# Patient Record
Sex: Female | Born: 1935 | Race: White | Hispanic: No | Marital: Married | Smoking: Former smoker
Health system: Southern US, Community
[De-identification: ages and names within clinical notes are randomized; demographics above are authoritative.]

## PROBLEM LIST (undated history)

## (undated) DIAGNOSIS — F32A Depression, unspecified: Secondary | ICD-10-CM

## (undated) DIAGNOSIS — C50919 Malignant neoplasm of unspecified site of unspecified female breast: Secondary | ICD-10-CM

## (undated) DIAGNOSIS — R32 Unspecified urinary incontinence: Secondary | ICD-10-CM

## (undated) DIAGNOSIS — G35 Multiple sclerosis: Secondary | ICD-10-CM

## (undated) DIAGNOSIS — J189 Pneumonia, unspecified organism: Secondary | ICD-10-CM

## (undated) DIAGNOSIS — M1990 Unspecified osteoarthritis, unspecified site: Secondary | ICD-10-CM

## (undated) DIAGNOSIS — K219 Gastro-esophageal reflux disease without esophagitis: Secondary | ICD-10-CM

## (undated) DIAGNOSIS — J439 Emphysema, unspecified: Secondary | ICD-10-CM

## (undated) DIAGNOSIS — K760 Fatty (change of) liver, not elsewhere classified: Secondary | ICD-10-CM

## (undated) DIAGNOSIS — E039 Hypothyroidism, unspecified: Secondary | ICD-10-CM

## (undated) DIAGNOSIS — F329 Major depressive disorder, single episode, unspecified: Secondary | ICD-10-CM

## (undated) DIAGNOSIS — L719 Rosacea, unspecified: Secondary | ICD-10-CM

## (undated) DIAGNOSIS — J383 Other diseases of vocal cords: Secondary | ICD-10-CM

## (undated) DIAGNOSIS — J449 Chronic obstructive pulmonary disease, unspecified: Secondary | ICD-10-CM

## (undated) DIAGNOSIS — R7611 Nonspecific reaction to tuberculin skin test without active tuberculosis: Secondary | ICD-10-CM

## (undated) DIAGNOSIS — E7800 Pure hypercholesterolemia, unspecified: Secondary | ICD-10-CM

## (undated) DIAGNOSIS — R259 Unspecified abnormal involuntary movements: Secondary | ICD-10-CM

## (undated) DIAGNOSIS — I10 Essential (primary) hypertension: Secondary | ICD-10-CM

## (undated) DIAGNOSIS — I2510 Atherosclerotic heart disease of native coronary artery without angina pectoris: Secondary | ICD-10-CM

## (undated) DIAGNOSIS — G459 Transient cerebral ischemic attack, unspecified: Secondary | ICD-10-CM

## (undated) DIAGNOSIS — G35D Multiple sclerosis, unspecified: Secondary | ICD-10-CM

## (undated) DIAGNOSIS — G709 Myoneural disorder, unspecified: Secondary | ICD-10-CM

## (undated) DIAGNOSIS — E119 Type 2 diabetes mellitus without complications: Secondary | ICD-10-CM

## (undated) HISTORY — DX: Pneumonia, unspecified organism: J18.9

## (undated) HISTORY — DX: Type 2 diabetes mellitus without complications: E11.9

## (undated) HISTORY — DX: Rosacea, unspecified: L71.9

## (undated) HISTORY — DX: Hypothyroidism, unspecified: E03.9

## (undated) HISTORY — DX: Pure hypercholesterolemia, unspecified: E78.00

## (undated) HISTORY — DX: Chronic obstructive pulmonary disease, unspecified: J44.9

## (undated) HISTORY — DX: Multiple sclerosis, unspecified: G35.D

## (undated) HISTORY — DX: Malignant neoplasm of unspecified site of unspecified female breast: C50.919

## (undated) HISTORY — DX: Other diseases of vocal cords: J38.3

## (undated) HISTORY — DX: Myoneural disorder, unspecified: G70.9

## (undated) HISTORY — DX: Gastro-esophageal reflux disease without esophagitis: K21.9

## (undated) HISTORY — DX: Nonspecific reaction to tuberculin skin test without active tuberculosis: R76.11

## (undated) HISTORY — DX: Depression, unspecified: F32.A

## (undated) HISTORY — DX: Major depressive disorder, single episode, unspecified: F32.9

## (undated) HISTORY — DX: Unspecified osteoarthritis, unspecified site: M19.90

## (undated) HISTORY — DX: Essential (primary) hypertension: I10

## (undated) HISTORY — DX: Unspecified abnormal involuntary movements: R25.9

## (undated) HISTORY — DX: Multiple sclerosis: G35

## (undated) HISTORY — DX: Atherosclerotic heart disease of native coronary artery without angina pectoris: I25.10

## (undated) HISTORY — DX: Emphysema, unspecified: J43.9

## (undated) HISTORY — DX: Fatty (change of) liver, not elsewhere classified: K76.0

## (undated) HISTORY — DX: Unspecified urinary incontinence: R32

## (undated) HISTORY — DX: Transient cerebral ischemic attack, unspecified: G45.9

---
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
## 1962-02-06 HISTORY — PX: DILATION AND CURETTAGE OF UTERUS: SHX78

## 1997-02-06 HISTORY — PX: NASAL SINUS SURGERY: SHX719

## 1997-06-08 ENCOUNTER — Other Ambulatory Visit: Admission: RE | Admit: 1997-06-08 | Discharge: 1997-06-08 | Payer: Self-pay | Admitting: Obstetrics and Gynecology

## 1997-07-27 ENCOUNTER — Inpatient Hospital Stay (HOSPITAL_COMMUNITY): Admission: EM | Admit: 1997-07-27 | Discharge: 1997-08-03 | Payer: Self-pay | Admitting: Critical Care Medicine

## 1997-11-09 ENCOUNTER — Encounter: Payer: Self-pay | Admitting: *Deleted

## 1997-11-11 ENCOUNTER — Ambulatory Visit (HOSPITAL_COMMUNITY): Admission: RE | Admit: 1997-11-11 | Discharge: 1997-11-12 | Payer: Self-pay | Admitting: *Deleted

## 1998-06-22 ENCOUNTER — Other Ambulatory Visit: Admission: RE | Admit: 1998-06-22 | Discharge: 1998-06-22 | Payer: Self-pay | Admitting: Obstetrics and Gynecology

## 1998-11-28 ENCOUNTER — Encounter: Payer: Self-pay | Admitting: Emergency Medicine

## 1998-11-28 ENCOUNTER — Emergency Department (HOSPITAL_COMMUNITY): Admission: EM | Admit: 1998-11-28 | Discharge: 1998-11-28 | Payer: Self-pay

## 1999-01-07 ENCOUNTER — Encounter: Payer: Self-pay | Admitting: Family Medicine

## 1999-06-21 ENCOUNTER — Inpatient Hospital Stay (HOSPITAL_COMMUNITY): Admission: EM | Admit: 1999-06-21 | Discharge: 1999-06-28 | Payer: Self-pay | Admitting: Critical Care Medicine

## 1999-06-21 ENCOUNTER — Encounter: Payer: Self-pay | Admitting: Critical Care Medicine

## 1999-07-25 ENCOUNTER — Other Ambulatory Visit: Admission: RE | Admit: 1999-07-25 | Discharge: 1999-07-25 | Payer: Self-pay | Admitting: Obstetrics and Gynecology

## 1999-08-11 ENCOUNTER — Ambulatory Visit (HOSPITAL_COMMUNITY): Admission: RE | Admit: 1999-08-11 | Discharge: 1999-08-11 | Payer: Self-pay | Admitting: Interventional Cardiology

## 1999-08-11 ENCOUNTER — Encounter: Payer: Self-pay | Admitting: Interventional Cardiology

## 1999-10-08 ENCOUNTER — Encounter: Payer: Self-pay | Admitting: Family Medicine

## 1999-10-08 LAB — CONVERTED CEMR LAB: Hgb A1c MFr Bld: 6.4 %

## 1999-11-07 ENCOUNTER — Encounter: Payer: Self-pay | Admitting: Family Medicine

## 1999-11-07 LAB — CONVERTED CEMR LAB
Hgb A1c MFr Bld: 6.7 %
Microalbumin U total vol: 1.86 mg/L

## 1999-12-06 ENCOUNTER — Encounter: Payer: Self-pay | Admitting: Neurology

## 1999-12-06 ENCOUNTER — Ambulatory Visit (HOSPITAL_COMMUNITY): Admission: RE | Admit: 1999-12-06 | Discharge: 1999-12-06 | Payer: Self-pay | Admitting: Neurology

## 2000-05-15 DIAGNOSIS — K449 Diaphragmatic hernia without obstruction or gangrene: Secondary | ICD-10-CM | POA: Insufficient documentation

## 2000-05-23 ENCOUNTER — Ambulatory Visit (HOSPITAL_COMMUNITY): Admission: RE | Admit: 2000-05-23 | Discharge: 2000-05-23 | Payer: Self-pay | Admitting: Gastroenterology

## 2000-05-23 ENCOUNTER — Encounter: Payer: Self-pay | Admitting: Gastroenterology

## 2000-06-04 ENCOUNTER — Ambulatory Visit (HOSPITAL_COMMUNITY): Admission: RE | Admit: 2000-06-04 | Discharge: 2000-06-04 | Payer: Self-pay | Admitting: Urology

## 2000-06-04 ENCOUNTER — Encounter: Payer: Self-pay | Admitting: Urology

## 2000-09-06 ENCOUNTER — Encounter: Payer: Self-pay | Admitting: Family Medicine

## 2001-02-06 ENCOUNTER — Encounter: Payer: Self-pay | Admitting: Family Medicine

## 2001-02-06 LAB — CONVERTED CEMR LAB: Hgb A1c MFr Bld: 6.8 %

## 2001-06-13 ENCOUNTER — Encounter: Payer: Self-pay | Admitting: Critical Care Medicine

## 2001-06-13 ENCOUNTER — Inpatient Hospital Stay (HOSPITAL_COMMUNITY): Admission: EM | Admit: 2001-06-13 | Discharge: 2001-06-19 | Payer: Self-pay | Admitting: Critical Care Medicine

## 2002-01-20 ENCOUNTER — Ambulatory Visit (HOSPITAL_COMMUNITY): Admission: RE | Admit: 2002-01-20 | Discharge: 2002-01-20 | Payer: Self-pay | Admitting: Gastroenterology

## 2002-01-20 ENCOUNTER — Encounter: Payer: Self-pay | Admitting: Gastroenterology

## 2002-01-23 ENCOUNTER — Ambulatory Visit (HOSPITAL_COMMUNITY): Admission: RE | Admit: 2002-01-23 | Discharge: 2002-01-23 | Payer: Self-pay | Admitting: Gastroenterology

## 2002-01-23 ENCOUNTER — Encounter: Payer: Self-pay | Admitting: Internal Medicine

## 2002-01-23 DIAGNOSIS — K2940 Chronic atrophic gastritis without bleeding: Secondary | ICD-10-CM | POA: Insufficient documentation

## 2002-02-07 ENCOUNTER — Encounter: Payer: Self-pay | Admitting: Family Medicine

## 2002-03-14 ENCOUNTER — Encounter: Payer: Self-pay | Admitting: Gastroenterology

## 2002-03-14 ENCOUNTER — Ambulatory Visit (HOSPITAL_COMMUNITY): Admission: RE | Admit: 2002-03-14 | Discharge: 2002-03-14 | Payer: Self-pay | Admitting: Gastroenterology

## 2002-05-12 ENCOUNTER — Encounter: Payer: Self-pay | Admitting: Critical Care Medicine

## 2002-05-12 ENCOUNTER — Inpatient Hospital Stay (HOSPITAL_COMMUNITY): Admission: AD | Admit: 2002-05-12 | Discharge: 2002-05-16 | Payer: Self-pay | Admitting: Critical Care Medicine

## 2002-12-03 ENCOUNTER — Ambulatory Visit (HOSPITAL_COMMUNITY): Admission: RE | Admit: 2002-12-03 | Discharge: 2002-12-03 | Payer: Self-pay | Admitting: Interventional Cardiology

## 2002-12-10 ENCOUNTER — Ambulatory Visit (HOSPITAL_COMMUNITY): Admission: RE | Admit: 2002-12-10 | Discharge: 2002-12-10 | Payer: Self-pay | Admitting: Interventional Cardiology

## 2002-12-19 ENCOUNTER — Other Ambulatory Visit: Admission: RE | Admit: 2002-12-19 | Discharge: 2002-12-19 | Payer: Self-pay | Admitting: Obstetrics and Gynecology

## 2003-03-10 ENCOUNTER — Encounter: Payer: Self-pay | Admitting: Family Medicine

## 2003-07-08 ENCOUNTER — Encounter: Payer: Self-pay | Admitting: Family Medicine

## 2003-07-08 LAB — CONVERTED CEMR LAB: Microalbumin U total vol: 9.5 mg/L

## 2003-08-20 ENCOUNTER — Ambulatory Visit (HOSPITAL_COMMUNITY): Admission: RE | Admit: 2003-08-20 | Discharge: 2003-08-20 | Payer: Self-pay | Admitting: Family Medicine

## 2003-10-08 ENCOUNTER — Encounter: Payer: Self-pay | Admitting: Family Medicine

## 2003-10-08 LAB — CONVERTED CEMR LAB: Microalbumin U total vol: 11.6 mg/L

## 2003-10-22 ENCOUNTER — Ambulatory Visit (HOSPITAL_COMMUNITY): Admission: RE | Admit: 2003-10-22 | Discharge: 2003-10-22 | Payer: Self-pay | Admitting: Gastroenterology

## 2003-10-22 ENCOUNTER — Encounter: Payer: Self-pay | Admitting: Internal Medicine

## 2003-10-22 DIAGNOSIS — D126 Benign neoplasm of colon, unspecified: Secondary | ICD-10-CM

## 2004-01-07 ENCOUNTER — Encounter: Payer: Self-pay | Admitting: Family Medicine

## 2004-01-08 ENCOUNTER — Ambulatory Visit: Payer: Self-pay | Admitting: Family Medicine

## 2004-01-11 ENCOUNTER — Ambulatory Visit: Payer: Self-pay | Admitting: Family Medicine

## 2004-01-12 ENCOUNTER — Ambulatory Visit: Payer: Self-pay | Admitting: Critical Care Medicine

## 2004-01-14 ENCOUNTER — Ambulatory Visit: Payer: Self-pay | Admitting: Critical Care Medicine

## 2004-01-28 ENCOUNTER — Ambulatory Visit: Payer: Self-pay | Admitting: Critical Care Medicine

## 2004-02-02 ENCOUNTER — Encounter: Admission: RE | Admit: 2004-02-02 | Discharge: 2004-02-02 | Payer: Self-pay | Admitting: Obstetrics and Gynecology

## 2004-02-07 HISTORY — PX: BREAST LUMPECTOMY: SHX2

## 2004-02-16 ENCOUNTER — Encounter: Admission: RE | Admit: 2004-02-16 | Discharge: 2004-02-16 | Payer: Self-pay | Admitting: General Surgery

## 2004-02-17 ENCOUNTER — Encounter (INDEPENDENT_AMBULATORY_CARE_PROVIDER_SITE_OTHER): Payer: Self-pay | Admitting: General Surgery

## 2004-02-17 ENCOUNTER — Ambulatory Visit (HOSPITAL_COMMUNITY): Admission: RE | Admit: 2004-02-17 | Discharge: 2004-02-17 | Payer: Self-pay | Admitting: General Surgery

## 2004-02-17 ENCOUNTER — Encounter: Admission: RE | Admit: 2004-02-17 | Discharge: 2004-02-17 | Payer: Self-pay | Admitting: General Surgery

## 2004-02-17 ENCOUNTER — Encounter (INDEPENDENT_AMBULATORY_CARE_PROVIDER_SITE_OTHER): Payer: Self-pay | Admitting: *Deleted

## 2004-02-17 ENCOUNTER — Ambulatory Visit (HOSPITAL_BASED_OUTPATIENT_CLINIC_OR_DEPARTMENT_OTHER): Admission: RE | Admit: 2004-02-17 | Discharge: 2004-02-17 | Payer: Self-pay | Admitting: General Surgery

## 2004-03-02 ENCOUNTER — Ambulatory Visit: Payer: Self-pay | Admitting: Critical Care Medicine

## 2004-03-07 ENCOUNTER — Ambulatory Visit (HOSPITAL_COMMUNITY): Admission: RE | Admit: 2004-03-07 | Discharge: 2004-03-08 | Payer: Self-pay | Admitting: General Surgery

## 2004-03-07 ENCOUNTER — Ambulatory Visit: Payer: Self-pay | Admitting: Internal Medicine

## 2004-03-07 ENCOUNTER — Encounter (INDEPENDENT_AMBULATORY_CARE_PROVIDER_SITE_OTHER): Payer: Self-pay | Admitting: *Deleted

## 2004-03-08 ENCOUNTER — Ambulatory Visit: Payer: Self-pay | Admitting: Oncology

## 2004-03-16 ENCOUNTER — Ambulatory Visit: Admission: RE | Admit: 2004-03-16 | Discharge: 2004-05-25 | Payer: Self-pay | Admitting: Radiation Oncology

## 2004-03-16 ENCOUNTER — Ambulatory Visit: Payer: Self-pay | Admitting: Critical Care Medicine

## 2004-03-21 ENCOUNTER — Encounter: Admission: RE | Admit: 2004-03-21 | Discharge: 2004-03-21 | Payer: Self-pay | Admitting: Oncology

## 2004-03-22 ENCOUNTER — Ambulatory Visit (HOSPITAL_COMMUNITY): Admission: RE | Admit: 2004-03-22 | Discharge: 2004-03-22 | Payer: Self-pay | Admitting: Oncology

## 2004-03-23 ENCOUNTER — Ambulatory Visit (HOSPITAL_COMMUNITY): Admission: RE | Admit: 2004-03-23 | Discharge: 2004-03-23 | Payer: Self-pay | Admitting: Oncology

## 2004-04-07 ENCOUNTER — Ambulatory Visit: Payer: Self-pay | Admitting: Family Medicine

## 2004-04-12 ENCOUNTER — Ambulatory Visit: Payer: Self-pay | Admitting: Family Medicine

## 2004-04-26 ENCOUNTER — Ambulatory Visit: Payer: Self-pay | Admitting: Critical Care Medicine

## 2004-05-16 ENCOUNTER — Ambulatory Visit: Payer: Self-pay | Admitting: Critical Care Medicine

## 2004-05-17 ENCOUNTER — Ambulatory Visit: Payer: Self-pay | Admitting: Oncology

## 2004-06-16 ENCOUNTER — Ambulatory Visit: Admission: RE | Admit: 2004-06-16 | Discharge: 2004-06-16 | Payer: Self-pay | Admitting: Radiation Oncology

## 2004-07-12 ENCOUNTER — Ambulatory Visit: Payer: Self-pay | Admitting: Oncology

## 2004-07-14 ENCOUNTER — Ambulatory Visit: Payer: Self-pay | Admitting: Critical Care Medicine

## 2004-08-10 ENCOUNTER — Ambulatory Visit: Payer: Self-pay | Admitting: Critical Care Medicine

## 2004-11-01 ENCOUNTER — Ambulatory Visit: Payer: Self-pay | Admitting: Family Medicine

## 2004-11-01 LAB — CONVERTED CEMR LAB: Hgb A1c MFr Bld: 5.8 %

## 2004-11-05 ENCOUNTER — Ambulatory Visit: Payer: Self-pay | Admitting: Family Medicine

## 2004-11-07 ENCOUNTER — Ambulatory Visit: Payer: Self-pay | Admitting: Family Medicine

## 2004-11-09 ENCOUNTER — Ambulatory Visit: Payer: Self-pay | Admitting: Family Medicine

## 2004-11-21 ENCOUNTER — Ambulatory Visit: Payer: Self-pay | Admitting: Family Medicine

## 2004-12-01 ENCOUNTER — Ambulatory Visit: Payer: Self-pay | Admitting: Critical Care Medicine

## 2004-12-27 ENCOUNTER — Ambulatory Visit: Payer: Self-pay | Admitting: Critical Care Medicine

## 2005-01-24 ENCOUNTER — Other Ambulatory Visit: Admission: RE | Admit: 2005-01-24 | Discharge: 2005-01-24 | Payer: Self-pay | Admitting: Obstetrics and Gynecology

## 2005-02-10 ENCOUNTER — Ambulatory Visit: Payer: Self-pay | Admitting: Critical Care Medicine

## 2005-02-13 ENCOUNTER — Encounter: Admission: RE | Admit: 2005-02-13 | Discharge: 2005-02-13 | Payer: Self-pay | Admitting: Obstetrics and Gynecology

## 2005-03-01 ENCOUNTER — Ambulatory Visit: Payer: Self-pay | Admitting: Critical Care Medicine

## 2005-04-24 ENCOUNTER — Ambulatory Visit: Payer: Self-pay | Admitting: Critical Care Medicine

## 2005-05-18 ENCOUNTER — Encounter (HOSPITAL_COMMUNITY): Admission: RE | Admit: 2005-05-18 | Discharge: 2005-08-16 | Payer: Self-pay | Admitting: Interventional Cardiology

## 2005-06-07 ENCOUNTER — Ambulatory Visit: Payer: Self-pay | Admitting: Critical Care Medicine

## 2005-07-07 ENCOUNTER — Encounter: Admission: RE | Admit: 2005-07-07 | Discharge: 2005-07-07 | Payer: Self-pay | Admitting: Oncology

## 2005-07-12 ENCOUNTER — Ambulatory Visit: Payer: Self-pay | Admitting: Oncology

## 2005-07-12 LAB — CBC WITH DIFFERENTIAL/PLATELET
BASO%: 0.5 % (ref 0.0–2.0)
EOS%: 7.4 % — ABNORMAL HIGH (ref 0.0–7.0)
HCT: 39.6 % (ref 34.8–46.6)
LYMPH%: 33.6 % (ref 14.0–48.0)
MCH: 33.2 pg (ref 26.0–34.0)
MCHC: 34.4 g/dL (ref 32.0–36.0)
MCV: 96.7 fL (ref 81.0–101.0)
MONO#: 0.6 10*3/uL (ref 0.1–0.9)
MONO%: 10.7 % (ref 0.0–13.0)
NEUT%: 47.8 % (ref 39.6–76.8)
Platelets: 217 10*3/uL (ref 145–400)

## 2005-07-27 ENCOUNTER — Ambulatory Visit: Payer: Self-pay | Admitting: Family Medicine

## 2005-08-18 ENCOUNTER — Ambulatory Visit: Payer: Self-pay | Admitting: Critical Care Medicine

## 2005-12-04 ENCOUNTER — Ambulatory Visit: Payer: Self-pay | Admitting: Critical Care Medicine

## 2006-02-08 ENCOUNTER — Ambulatory Visit: Payer: Self-pay | Admitting: Gastroenterology

## 2006-02-16 ENCOUNTER — Encounter: Admission: RE | Admit: 2006-02-16 | Discharge: 2006-02-16 | Payer: Self-pay | Admitting: General Surgery

## 2006-02-26 ENCOUNTER — Ambulatory Visit: Payer: Self-pay | Admitting: Critical Care Medicine

## 2006-03-05 ENCOUNTER — Ambulatory Visit: Payer: Self-pay | Admitting: Family Medicine

## 2006-03-05 LAB — CONVERTED CEMR LAB: Creatinine, Ser: 1.1 mg/dL (ref 0.4–1.2)

## 2006-03-07 ENCOUNTER — Encounter: Admission: RE | Admit: 2006-03-07 | Discharge: 2006-03-07 | Payer: Self-pay | Admitting: General Surgery

## 2006-03-21 ENCOUNTER — Ambulatory Visit: Payer: Self-pay | Admitting: Critical Care Medicine

## 2006-05-02 ENCOUNTER — Ambulatory Visit: Payer: Self-pay | Admitting: Critical Care Medicine

## 2006-07-09 ENCOUNTER — Ambulatory Visit: Payer: Self-pay | Admitting: Oncology

## 2006-07-11 LAB — CBC WITH DIFFERENTIAL/PLATELET
Basophils Absolute: 0 10*3/uL (ref 0.0–0.1)
Eosinophils Absolute: 0.4 10*3/uL (ref 0.0–0.5)
HCT: 35.6 % (ref 34.8–46.6)
HGB: 12.5 g/dL (ref 11.6–15.9)
LYMPH%: 29.4 % (ref 14.0–48.0)
MONO#: 0.6 10*3/uL (ref 0.1–0.9)
NEUT#: 3 10*3/uL (ref 1.5–6.5)
Platelets: 218 10*3/uL (ref 145–400)
RBC: 3.62 10*6/uL — ABNORMAL LOW (ref 3.70–5.32)
WBC: 5.6 10*3/uL (ref 3.9–10.0)

## 2006-07-26 ENCOUNTER — Ambulatory Visit: Payer: Self-pay | Admitting: Critical Care Medicine

## 2006-09-05 ENCOUNTER — Ambulatory Visit: Payer: Self-pay | Admitting: Pulmonary Disease

## 2006-09-05 ENCOUNTER — Inpatient Hospital Stay (HOSPITAL_COMMUNITY): Admission: EM | Admit: 2006-09-05 | Discharge: 2006-09-14 | Payer: Self-pay | Admitting: Emergency Medicine

## 2006-09-11 ENCOUNTER — Encounter: Payer: Self-pay | Admitting: Critical Care Medicine

## 2006-09-27 ENCOUNTER — Ambulatory Visit: Payer: Self-pay | Admitting: Critical Care Medicine

## 2006-10-29 ENCOUNTER — Ambulatory Visit: Payer: Self-pay | Admitting: Critical Care Medicine

## 2006-10-30 DIAGNOSIS — R32 Unspecified urinary incontinence: Secondary | ICD-10-CM

## 2006-11-15 ENCOUNTER — Ambulatory Visit: Payer: Self-pay | Admitting: Family Medicine

## 2006-11-15 DIAGNOSIS — J209 Acute bronchitis, unspecified: Secondary | ICD-10-CM | POA: Insufficient documentation

## 2006-11-19 ENCOUNTER — Telehealth: Payer: Self-pay | Admitting: Family Medicine

## 2006-11-29 ENCOUNTER — Encounter: Payer: Self-pay | Admitting: Family Medicine

## 2006-12-04 ENCOUNTER — Encounter: Payer: Self-pay | Admitting: Family Medicine

## 2006-12-04 DIAGNOSIS — E119 Type 2 diabetes mellitus without complications: Secondary | ICD-10-CM | POA: Insufficient documentation

## 2006-12-04 DIAGNOSIS — J383 Other diseases of vocal cords: Secondary | ICD-10-CM

## 2006-12-04 DIAGNOSIS — L719 Rosacea, unspecified: Secondary | ICD-10-CM | POA: Insufficient documentation

## 2006-12-04 DIAGNOSIS — J4489 Other specified chronic obstructive pulmonary disease: Secondary | ICD-10-CM | POA: Insufficient documentation

## 2006-12-04 DIAGNOSIS — R259 Unspecified abnormal involuntary movements: Secondary | ICD-10-CM | POA: Insufficient documentation

## 2006-12-04 DIAGNOSIS — F329 Major depressive disorder, single episode, unspecified: Secondary | ICD-10-CM

## 2006-12-04 DIAGNOSIS — K219 Gastro-esophageal reflux disease without esophagitis: Secondary | ICD-10-CM

## 2006-12-04 DIAGNOSIS — I10 Essential (primary) hypertension: Secondary | ICD-10-CM | POA: Insufficient documentation

## 2006-12-04 DIAGNOSIS — J45909 Unspecified asthma, uncomplicated: Secondary | ICD-10-CM | POA: Insufficient documentation

## 2006-12-04 DIAGNOSIS — G35 Multiple sclerosis: Secondary | ICD-10-CM

## 2006-12-04 DIAGNOSIS — E039 Hypothyroidism, unspecified: Secondary | ICD-10-CM | POA: Insufficient documentation

## 2006-12-04 DIAGNOSIS — J449 Chronic obstructive pulmonary disease, unspecified: Secondary | ICD-10-CM

## 2006-12-04 DIAGNOSIS — F3289 Other specified depressive episodes: Secondary | ICD-10-CM | POA: Insufficient documentation

## 2006-12-04 DIAGNOSIS — C50919 Malignant neoplasm of unspecified site of unspecified female breast: Secondary | ICD-10-CM | POA: Insufficient documentation

## 2006-12-14 ENCOUNTER — Ambulatory Visit: Payer: Self-pay | Admitting: Family Medicine

## 2006-12-16 LAB — CONVERTED CEMR LAB: Hgb A1c MFr Bld: 6.3 % — ABNORMAL HIGH (ref 4.6–6.0)

## 2006-12-18 ENCOUNTER — Ambulatory Visit: Payer: Self-pay | Admitting: Family Medicine

## 2006-12-18 LAB — CONVERTED CEMR LAB: T3, Free: 2.6 pg/mL (ref 2.3–4.2)

## 2006-12-21 ENCOUNTER — Telehealth (INDEPENDENT_AMBULATORY_CARE_PROVIDER_SITE_OTHER): Payer: Self-pay | Admitting: *Deleted

## 2006-12-28 ENCOUNTER — Encounter: Payer: Self-pay | Admitting: Critical Care Medicine

## 2006-12-28 ENCOUNTER — Ambulatory Visit: Payer: Self-pay | Admitting: Critical Care Medicine

## 2007-01-16 ENCOUNTER — Telehealth: Payer: Self-pay | Admitting: Family Medicine

## 2007-01-25 ENCOUNTER — Ambulatory Visit: Payer: Self-pay | Admitting: Critical Care Medicine

## 2007-01-28 ENCOUNTER — Telehealth (INDEPENDENT_AMBULATORY_CARE_PROVIDER_SITE_OTHER): Payer: Self-pay | Admitting: *Deleted

## 2007-01-29 ENCOUNTER — Telehealth: Payer: Self-pay | Admitting: Pulmonary Disease

## 2007-01-29 ENCOUNTER — Encounter: Payer: Self-pay | Admitting: Pulmonary Disease

## 2007-01-29 ENCOUNTER — Ambulatory Visit: Payer: Self-pay | Admitting: Pulmonary Disease

## 2007-02-05 ENCOUNTER — Telehealth (INDEPENDENT_AMBULATORY_CARE_PROVIDER_SITE_OTHER): Payer: Self-pay | Admitting: *Deleted

## 2007-02-14 ENCOUNTER — Telehealth: Payer: Self-pay | Admitting: Critical Care Medicine

## 2007-02-18 ENCOUNTER — Encounter: Admission: RE | Admit: 2007-02-18 | Discharge: 2007-02-18 | Payer: Self-pay | Admitting: Oncology

## 2007-02-18 ENCOUNTER — Encounter: Payer: Self-pay | Admitting: Family Medicine

## 2007-02-20 ENCOUNTER — Encounter (INDEPENDENT_AMBULATORY_CARE_PROVIDER_SITE_OTHER): Payer: Self-pay | Admitting: *Deleted

## 2007-02-28 ENCOUNTER — Ambulatory Visit: Payer: Self-pay | Admitting: Pulmonary Disease

## 2007-03-15 ENCOUNTER — Ambulatory Visit: Payer: Self-pay | Admitting: Critical Care Medicine

## 2007-03-20 ENCOUNTER — Ambulatory Visit: Payer: Self-pay | Admitting: Family Medicine

## 2007-03-20 DIAGNOSIS — M542 Cervicalgia: Secondary | ICD-10-CM

## 2007-03-22 ENCOUNTER — Encounter: Admission: RE | Admit: 2007-03-22 | Discharge: 2007-03-22 | Payer: Self-pay | Admitting: Neurology

## 2007-04-11 ENCOUNTER — Telehealth (INDEPENDENT_AMBULATORY_CARE_PROVIDER_SITE_OTHER): Payer: Self-pay | Admitting: *Deleted

## 2007-05-22 ENCOUNTER — Ambulatory Visit: Payer: Self-pay | Admitting: Critical Care Medicine

## 2007-05-27 ENCOUNTER — Ambulatory Visit: Payer: Self-pay | Admitting: Family Medicine

## 2007-05-27 LAB — CONVERTED CEMR LAB
ALT: 38 units/L — ABNORMAL HIGH (ref 0–35)
AST: 37 units/L (ref 0–37)
Albumin: 3.7 g/dL (ref 3.5–5.2)
Basophils Relative: 0.1 % (ref 0.0–1.0)
Calcium: 9.3 mg/dL (ref 8.4–10.5)
Chloride: 104 meq/L (ref 96–112)
Eosinophils Relative: 6.8 % — ABNORMAL HIGH (ref 0.0–5.0)
HDL: 47.4 mg/dL (ref >39.0)
Hgb A1c MFr Bld: 7 % — ABNORMAL HIGH (ref 4.6–6.0)
LDL Cholesterol: 69 mg/dL (ref 0–99)
Lymphocytes Relative: 29.8 % (ref 12.0–46.0)
Monocytes Absolute: 0.6 10*3/uL (ref 0.1–1.0)
Neutro Abs: 3.6 10*3/uL (ref 1.4–7.7)
Potassium: 4.4 meq/L (ref 3.5–5.1)
RDW: 13.6 % (ref 11.5–14.6)
TSH: 1.1 microintl units/mL (ref 0.35–5.50)
Total CHOL/HDL Ratio: 3.3
Total Protein: 6.7 g/dL (ref 6.0–8.3)
Triglycerides: 195 mg/dL — ABNORMAL HIGH (ref 0–149)
VLDL: 39 mg/dL (ref 0–40)
WBC: 6.5 10*3/uL (ref 4.5–10.5)

## 2007-05-28 ENCOUNTER — Ambulatory Visit: Payer: Self-pay | Admitting: Family Medicine

## 2007-05-28 LAB — CONVERTED CEMR LAB: Microalb, Ur: 1.1 mg/dL (ref 0.0–1.9)

## 2007-05-31 ENCOUNTER — Ambulatory Visit: Payer: Self-pay | Admitting: Family Medicine

## 2007-07-05 ENCOUNTER — Ambulatory Visit: Payer: Self-pay | Admitting: Internal Medicine

## 2007-07-05 DIAGNOSIS — R1319 Other dysphagia: Secondary | ICD-10-CM | POA: Insufficient documentation

## 2007-07-05 DIAGNOSIS — K5900 Constipation, unspecified: Secondary | ICD-10-CM | POA: Insufficient documentation

## 2007-07-08 ENCOUNTER — Ambulatory Visit: Payer: Self-pay | Admitting: Oncology

## 2007-07-10 ENCOUNTER — Encounter: Payer: Self-pay | Admitting: Critical Care Medicine

## 2007-07-10 ENCOUNTER — Encounter: Payer: Self-pay | Admitting: Family Medicine

## 2007-07-10 LAB — CBC WITH DIFFERENTIAL/PLATELET
EOS%: 6.4 % (ref 0.0–7.0)
HGB: 13.2 g/dL (ref 11.6–15.9)
MCH: 33.3 pg (ref 26.0–34.0)
MCV: 96.9 fL (ref 81.0–101.0)
MONO%: 8.4 % (ref 0.0–13.0)
NEUT#: 3.8 10*3/uL (ref 1.5–6.5)
RBC: 3.96 10*6/uL (ref 3.70–5.32)
RDW: 13.6 % (ref 11.3–14.5)
lymph#: 1.5 10*3/uL (ref 0.9–3.3)

## 2007-07-11 ENCOUNTER — Telehealth: Payer: Self-pay | Admitting: Family Medicine

## 2007-07-11 LAB — COMPREHENSIVE METABOLIC PANEL
ALT: 29 U/L (ref 0–35)
AST: 25 U/L (ref 0–37)
Albumin: 4 g/dL (ref 3.5–5.2)
Alkaline Phosphatase: 117 U/L (ref 39–117)
Calcium: 9.1 mg/dL (ref 8.4–10.5)
Chloride: 102 mEq/L (ref 96–112)
Potassium: 4.8 mEq/L (ref 3.5–5.3)
Sodium: 136 mEq/L (ref 135–145)
Total Protein: 6.6 g/dL (ref 6.0–8.3)

## 2007-07-22 ENCOUNTER — Ambulatory Visit (HOSPITAL_COMMUNITY): Admission: RE | Admit: 2007-07-22 | Discharge: 2007-07-22 | Payer: Self-pay | Admitting: Internal Medicine

## 2007-07-22 ENCOUNTER — Encounter: Payer: Self-pay | Admitting: Internal Medicine

## 2007-07-24 ENCOUNTER — Ambulatory Visit: Payer: Self-pay | Admitting: Internal Medicine

## 2007-08-01 ENCOUNTER — Encounter: Payer: Self-pay | Admitting: Family Medicine

## 2007-08-14 ENCOUNTER — Encounter: Payer: Self-pay | Admitting: Family Medicine

## 2007-08-14 LAB — CANCER ANTIGEN 27.29: CA 27.29: 37 U/mL (ref 0–39)

## 2007-12-16 ENCOUNTER — Ambulatory Visit: Payer: Self-pay | Admitting: Internal Medicine

## 2008-01-07 ENCOUNTER — Encounter: Payer: Self-pay | Admitting: Critical Care Medicine

## 2008-01-20 ENCOUNTER — Ambulatory Visit: Payer: Self-pay | Admitting: Critical Care Medicine

## 2008-02-17 ENCOUNTER — Encounter: Payer: Self-pay | Admitting: Family Medicine

## 2008-02-20 ENCOUNTER — Telehealth: Payer: Self-pay | Admitting: Family Medicine

## 2008-03-03 ENCOUNTER — Encounter: Admission: RE | Admit: 2008-03-03 | Discharge: 2008-03-03 | Payer: Self-pay | Admitting: Family Medicine

## 2008-03-04 ENCOUNTER — Encounter (INDEPENDENT_AMBULATORY_CARE_PROVIDER_SITE_OTHER): Payer: Self-pay | Admitting: *Deleted

## 2008-03-18 ENCOUNTER — Encounter: Payer: Self-pay | Admitting: Family Medicine

## 2008-06-02 ENCOUNTER — Telehealth: Payer: Self-pay | Admitting: Family Medicine

## 2008-06-06 HISTORY — PX: FRACTURE SURGERY: SHX138

## 2008-07-02 ENCOUNTER — Telehealth: Payer: Self-pay | Admitting: Family Medicine

## 2008-07-07 ENCOUNTER — Ambulatory Visit: Payer: Self-pay | Admitting: Oncology

## 2008-07-30 ENCOUNTER — Ambulatory Visit: Payer: Self-pay | Admitting: Diagnostic Radiology

## 2008-07-30 ENCOUNTER — Ambulatory Visit (HOSPITAL_BASED_OUTPATIENT_CLINIC_OR_DEPARTMENT_OTHER): Admission: RE | Admit: 2008-07-30 | Discharge: 2008-07-30 | Payer: Self-pay | Admitting: Critical Care Medicine

## 2008-07-30 ENCOUNTER — Ambulatory Visit: Payer: Self-pay | Admitting: Family Medicine

## 2008-07-30 ENCOUNTER — Ambulatory Visit: Payer: Self-pay | Admitting: Critical Care Medicine

## 2008-07-30 DIAGNOSIS — R5383 Other fatigue: Secondary | ICD-10-CM

## 2008-07-30 DIAGNOSIS — R5381 Other malaise: Secondary | ICD-10-CM

## 2008-07-31 ENCOUNTER — Ambulatory Visit: Payer: Self-pay | Admitting: Adult Health

## 2008-07-31 LAB — CONVERTED CEMR LAB
BUN: 22 mg/dL (ref 6–23)
Basophils Absolute: 0.1 10*3/uL (ref 0.0–0.1)
Bilirubin, Direct: 0.2 mg/dL (ref 0.0–0.3)
Chloride: 101 meq/L (ref 96–112)
Creatinine, Ser: 1.2 mg/dL (ref 0.4–1.2)
Eosinophils Absolute: 0.4 10*3/uL (ref 0.0–0.7)
Eosinophils Relative: 4.3 % (ref 0.0–5.0)
Ferritin: 289.8 ng/mL (ref 10.0–291.0)
Free T4: 1.3 ng/dL (ref 0.6–1.6)
Glucose, Bld: 130 mg/dL — ABNORMAL HIGH (ref 70–99)
MCHC: 34.5 g/dL (ref 30.0–36.0)
MCV: 99.6 fL (ref 78.0–100.0)
Magnesium: 2.5 mg/dL (ref 1.5–2.5)
Monocytes Absolute: 0.8 10*3/uL (ref 0.1–1.0)
Neutrophils Relative %: 74.8 % (ref 43.0–77.0)
Platelets: 209 10*3/uL (ref 150.0–400.0)
RDW: 12.4 % (ref 11.5–14.6)
T3, Free: 2.2 pg/mL — ABNORMAL LOW (ref 2.3–4.2)
TSH: 0.59 microintl units/mL (ref 0.35–5.50)
Total Bilirubin: 0.8 mg/dL (ref 0.3–1.2)
Vitamin B-12: 822 pg/mL (ref 211–911)
WBC: 9.3 10*3/uL (ref 4.5–10.5)

## 2008-08-04 ENCOUNTER — Encounter (INDEPENDENT_AMBULATORY_CARE_PROVIDER_SITE_OTHER): Payer: Self-pay | Admitting: *Deleted

## 2008-08-06 ENCOUNTER — Ambulatory Visit (HOSPITAL_BASED_OUTPATIENT_CLINIC_OR_DEPARTMENT_OTHER): Admission: RE | Admit: 2008-08-06 | Discharge: 2008-08-06 | Payer: Self-pay | Admitting: Critical Care Medicine

## 2008-08-06 ENCOUNTER — Ambulatory Visit: Payer: Self-pay | Admitting: Diagnostic Radiology

## 2008-08-06 ENCOUNTER — Ambulatory Visit: Payer: Self-pay | Admitting: Critical Care Medicine

## 2008-08-07 ENCOUNTER — Encounter: Payer: Self-pay | Admitting: Critical Care Medicine

## 2008-08-11 ENCOUNTER — Telehealth: Payer: Self-pay | Admitting: Critical Care Medicine

## 2008-08-12 ENCOUNTER — Encounter: Payer: Self-pay | Admitting: Critical Care Medicine

## 2008-08-13 ENCOUNTER — Encounter: Payer: Self-pay | Admitting: Critical Care Medicine

## 2008-08-13 ENCOUNTER — Ambulatory Visit: Payer: Self-pay | Admitting: Cardiology

## 2008-08-25 ENCOUNTER — Ambulatory Visit: Payer: Self-pay | Admitting: Oncology

## 2008-08-25 ENCOUNTER — Ambulatory Visit: Payer: Self-pay | Admitting: Critical Care Medicine

## 2008-08-27 ENCOUNTER — Encounter: Payer: Self-pay | Admitting: Family Medicine

## 2008-08-27 LAB — CBC WITH DIFFERENTIAL/PLATELET
Basophils Absolute: 0 10*3/uL (ref 0.0–0.1)
HCT: 38.4 % (ref 34.8–46.6)
HGB: 13.1 g/dL (ref 11.6–15.9)
LYMPH%: 28.5 % (ref 14.0–49.7)
MCHC: 34.2 g/dL (ref 31.5–36.0)
MONO#: 0.7 10*3/uL (ref 0.1–0.9)
NEUT%: 51.3 % (ref 38.4–76.8)
Platelets: 175 10*3/uL (ref 145–400)
WBC: 6.7 10*3/uL (ref 3.9–10.3)
lymph#: 1.9 10*3/uL (ref 0.9–3.3)

## 2008-08-27 LAB — COMPREHENSIVE METABOLIC PANEL
Albumin: 3.8 g/dL (ref 3.5–5.2)
BUN: 24 mg/dL — ABNORMAL HIGH (ref 6–23)
CO2: 26 mEq/L (ref 19–32)
Calcium: 9.2 mg/dL (ref 8.4–10.5)
Chloride: 101 mEq/L (ref 96–112)
Creatinine, Ser: 1.09 mg/dL (ref 0.40–1.20)
Glucose, Bld: 150 mg/dL — ABNORMAL HIGH (ref 70–99)
Potassium: 5 mEq/L (ref 3.5–5.3)

## 2008-09-08 ENCOUNTER — Ambulatory Visit: Payer: Self-pay | Admitting: Critical Care Medicine

## 2008-09-28 IMAGING — CR DG CHEST 2V
2 series · 2 of 2 positions shown · non-contrast
Comparison: 02/16/04.

CLINICAL DATA: Shortness of breath.  COPD.  
 AP AND LATERAL CHEST - 2 VIEW:

[w chest lat]
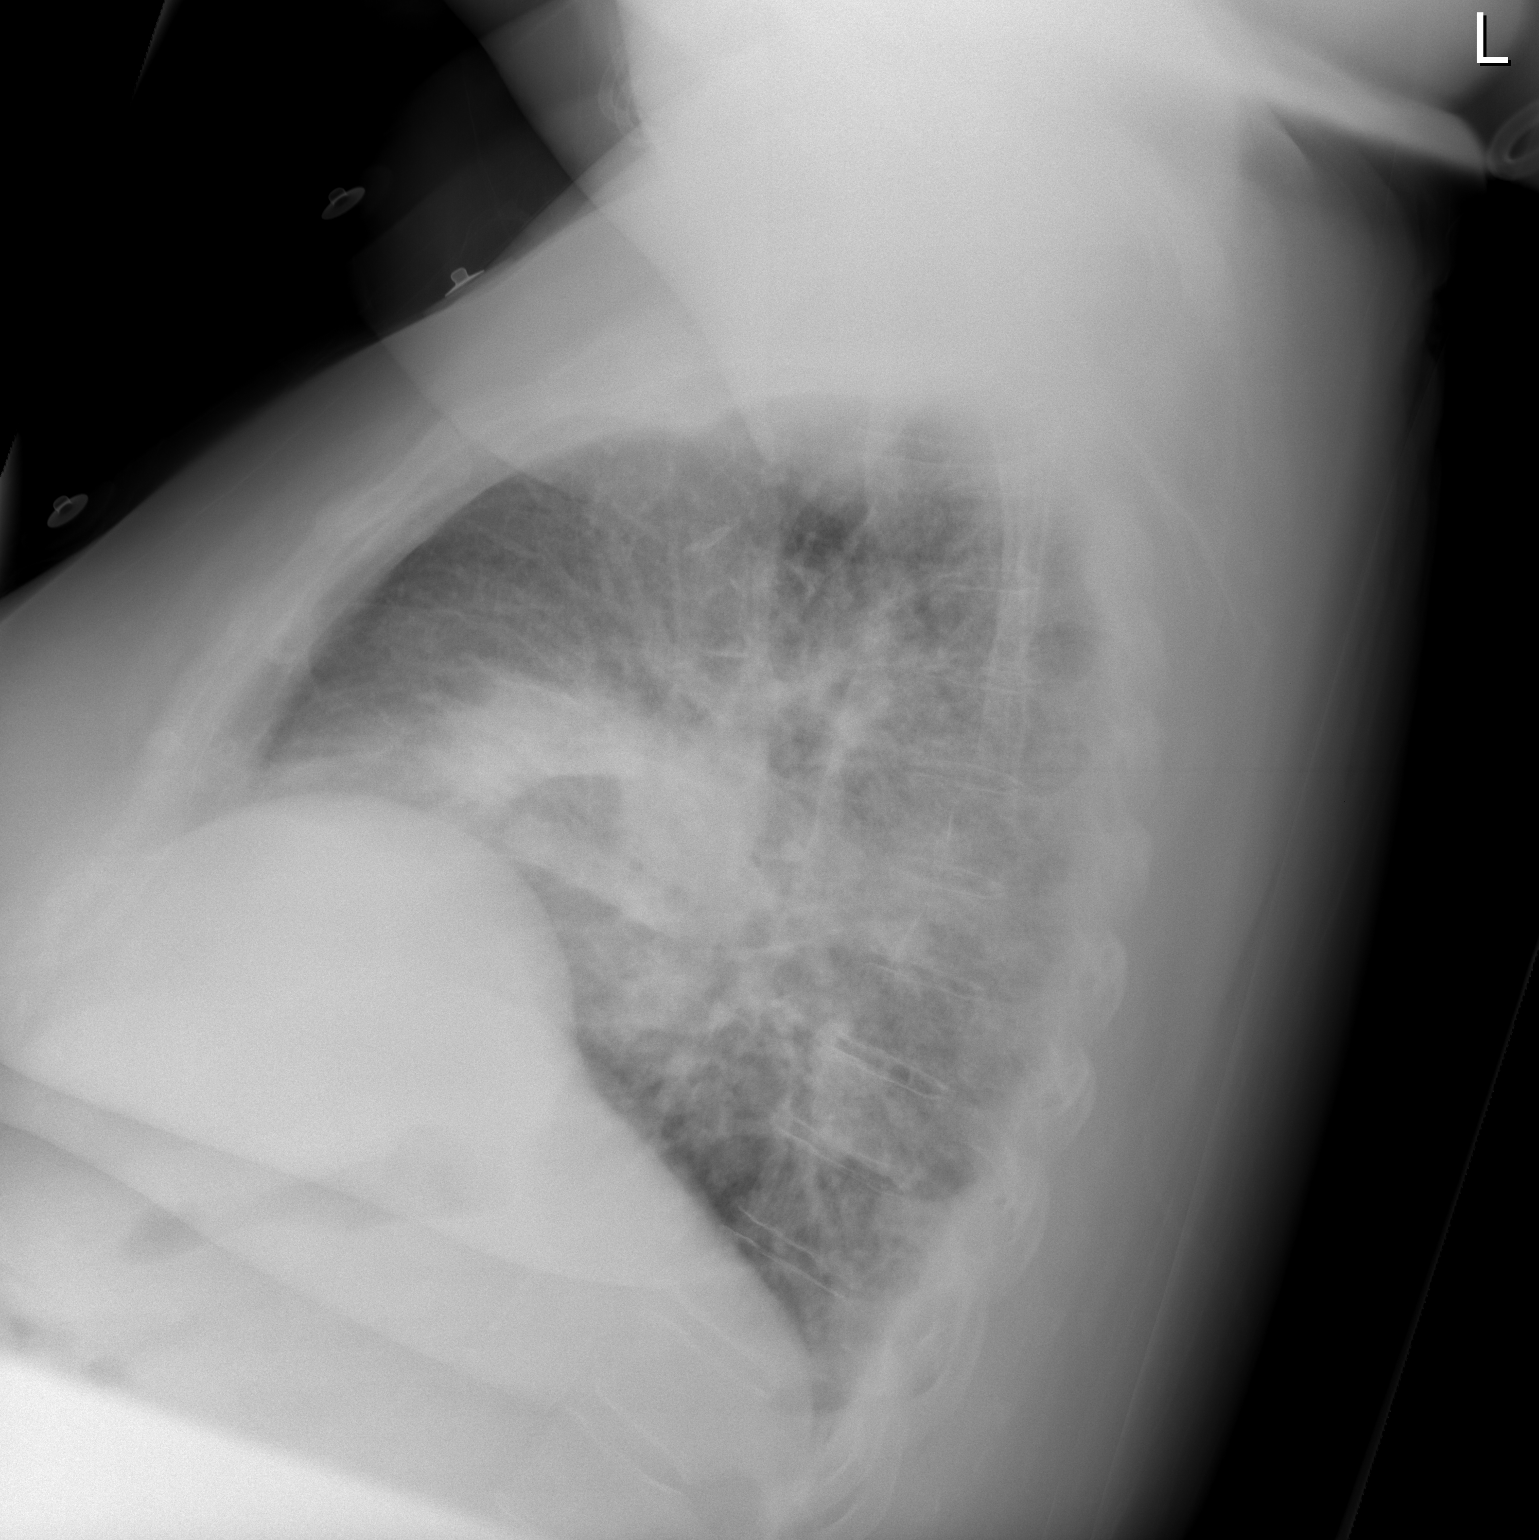

[view not recorded]
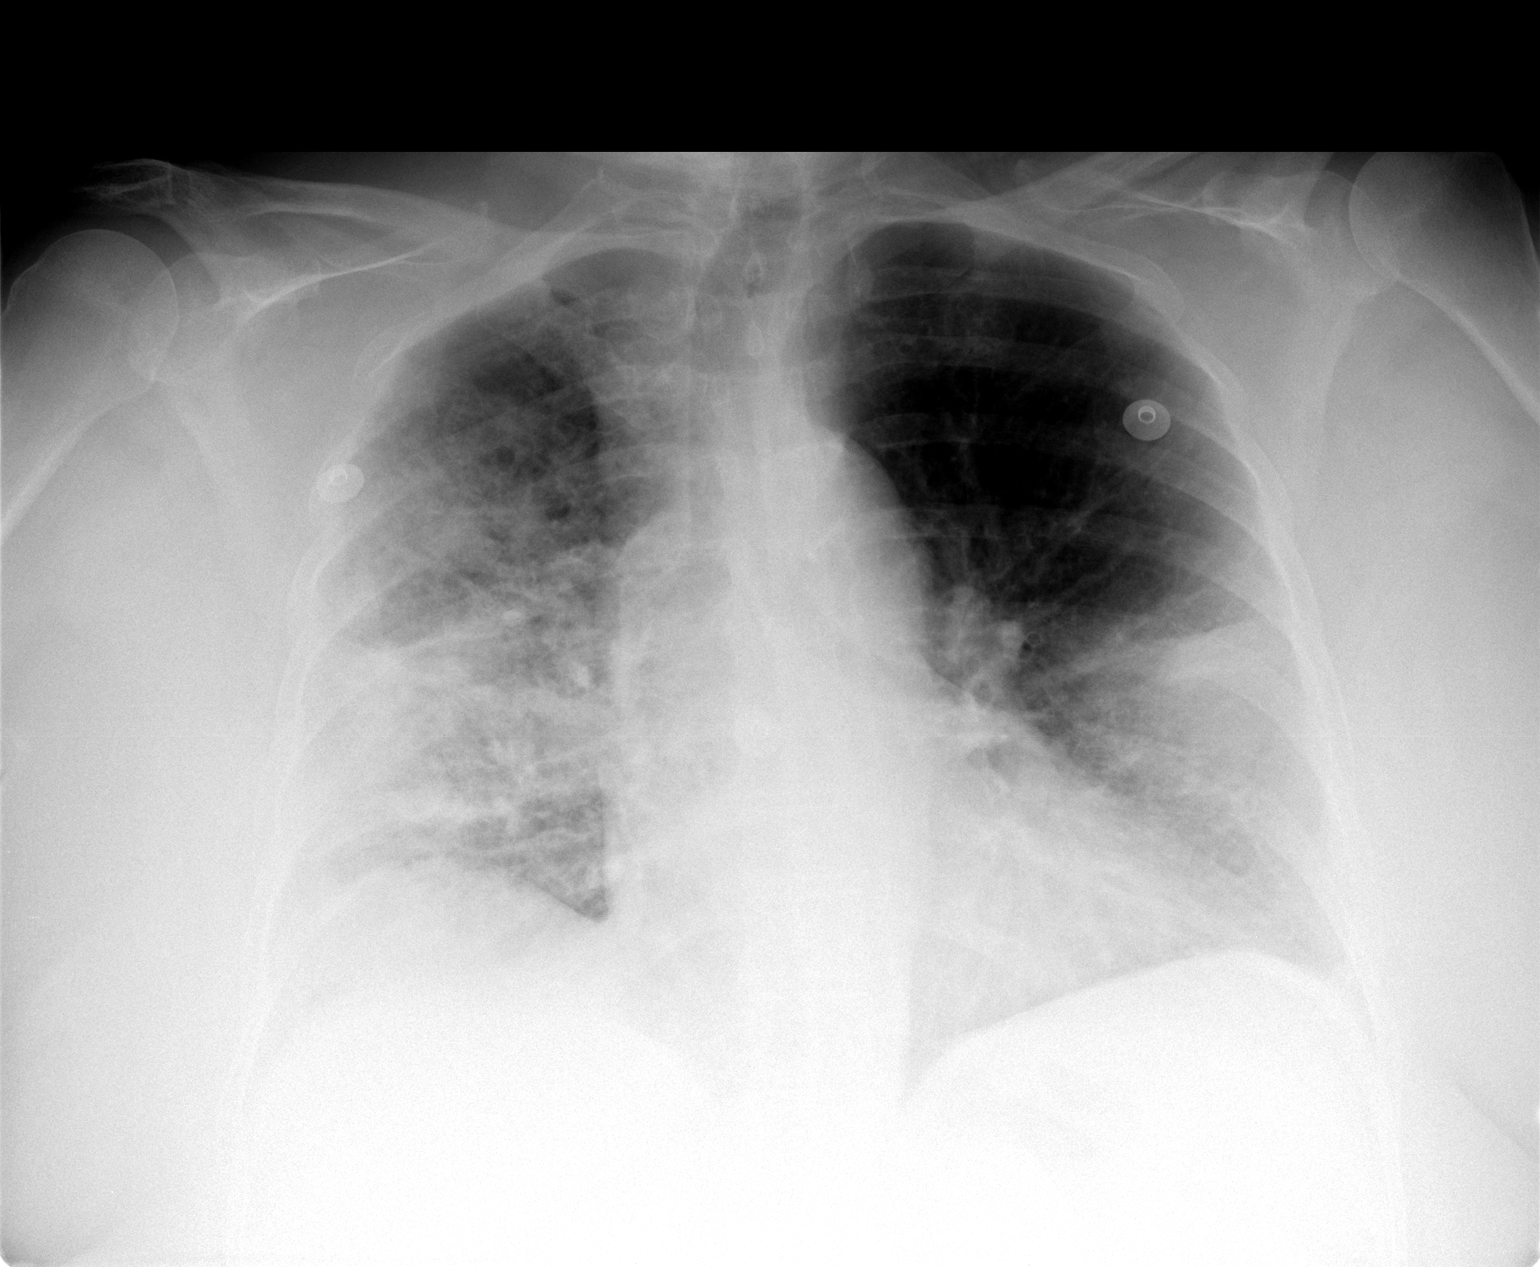

[2 of 2 positions shown; findings below may reference images not displayed]

FINDINGS: The patient has extensive pneumonia in the right lung as well as a focal area of infiltrate in the lateral aspect of the left mid zone.  Heart size and vascularity are within normal limits.
IMPRESSION: Bilateral pneumonia. If the patient is not febrile, the possibility of metastatic disease in this patient with a history of breast malignancy should be considered.

## 2008-10-05 ENCOUNTER — Ambulatory Visit: Payer: Self-pay | Admitting: Internal Medicine

## 2008-10-06 ENCOUNTER — Telehealth (INDEPENDENT_AMBULATORY_CARE_PROVIDER_SITE_OTHER): Payer: Self-pay | Admitting: *Deleted

## 2008-10-15 ENCOUNTER — Ambulatory Visit: Payer: Self-pay | Admitting: Critical Care Medicine

## 2008-10-19 ENCOUNTER — Ambulatory Visit: Payer: Self-pay | Admitting: Vascular Surgery

## 2008-10-19 ENCOUNTER — Encounter: Payer: Self-pay | Admitting: Critical Care Medicine

## 2008-10-19 ENCOUNTER — Encounter: Payer: Self-pay | Admitting: Family Medicine

## 2008-11-06 ENCOUNTER — Telehealth (INDEPENDENT_AMBULATORY_CARE_PROVIDER_SITE_OTHER): Payer: Self-pay | Admitting: Internal Medicine

## 2008-11-30 ENCOUNTER — Encounter: Payer: Self-pay | Admitting: Family Medicine

## 2008-12-01 ENCOUNTER — Encounter: Payer: Self-pay | Admitting: Family Medicine

## 2008-12-07 ENCOUNTER — Telehealth: Payer: Self-pay | Admitting: Family Medicine

## 2008-12-15 ENCOUNTER — Telehealth (INDEPENDENT_AMBULATORY_CARE_PROVIDER_SITE_OTHER): Payer: Self-pay | Admitting: Internal Medicine

## 2008-12-17 ENCOUNTER — Telehealth: Payer: Self-pay | Admitting: Family Medicine

## 2008-12-17 ENCOUNTER — Ambulatory Visit: Payer: Self-pay | Admitting: Critical Care Medicine

## 2008-12-18 ENCOUNTER — Telehealth: Payer: Self-pay | Admitting: Critical Care Medicine

## 2009-01-07 ENCOUNTER — Ambulatory Visit: Payer: Self-pay | Admitting: Podiatry

## 2009-02-01 ENCOUNTER — Telehealth: Payer: Self-pay | Admitting: Family Medicine

## 2009-02-03 ENCOUNTER — Ambulatory Visit: Payer: Self-pay | Admitting: Critical Care Medicine

## 2009-02-15 ENCOUNTER — Ambulatory Visit: Payer: Self-pay | Admitting: Critical Care Medicine

## 2009-02-15 ENCOUNTER — Ambulatory Visit: Payer: Self-pay | Admitting: Internal Medicine

## 2009-02-22 ENCOUNTER — Ambulatory Visit: Payer: Self-pay | Admitting: Internal Medicine

## 2009-02-24 ENCOUNTER — Ambulatory Visit: Payer: Self-pay | Admitting: Family Medicine

## 2009-02-24 LAB — CONVERTED CEMR LAB
AST: 23 units/L (ref 0–37)
Alkaline Phosphatase: 92 units/L (ref 39–117)
Basophils Absolute: 0.1 10*3/uL (ref 0.0–0.1)
Basophils Relative: 0.8 % (ref 0.0–3.0)
CO2: 29 meq/L (ref 19–32)
Calcium: 9.2 mg/dL (ref 8.4–10.5)
Chloride: 103 meq/L (ref 96–112)
Creatinine, Ser: 1.2 mg/dL (ref 0.4–1.2)
Eosinophils Absolute: 0.2 10*3/uL (ref 0.0–0.7)
Glucose, Bld: 75 mg/dL (ref 70–99)
Hemoglobin: 14.5 g/dL (ref 12.0–15.0)
LDL Cholesterol: 41 mg/dL (ref 0–99)
Lymphocytes Relative: 31 % (ref 12.0–46.0)
MCHC: 32.8 g/dL (ref 30.0–36.0)
MCV: 102.7 fL — ABNORMAL HIGH (ref 78.0–100.0)
Monocytes Absolute: 0.8 10*3/uL (ref 0.1–1.0)
Neutro Abs: 6.1 10*3/uL (ref 1.4–7.7)
RBC: 4.31 M/uL (ref 3.87–5.11)
RDW: 12.6 % (ref 11.5–14.6)
Total Bilirubin: 0.9 mg/dL (ref 0.3–1.2)
Total CHOL/HDL Ratio: 2

## 2009-02-25 ENCOUNTER — Telehealth (INDEPENDENT_AMBULATORY_CARE_PROVIDER_SITE_OTHER): Payer: Self-pay | Admitting: *Deleted

## 2009-02-25 LAB — CONVERTED CEMR LAB: Vitamin B-12: 646 pg/mL (ref 211–911)

## 2009-03-02 ENCOUNTER — Ambulatory Visit: Payer: Self-pay | Admitting: Family Medicine

## 2009-03-04 ENCOUNTER — Ambulatory Visit: Payer: Self-pay | Admitting: Critical Care Medicine

## 2009-03-09 ENCOUNTER — Encounter (INDEPENDENT_AMBULATORY_CARE_PROVIDER_SITE_OTHER): Payer: Self-pay | Admitting: *Deleted

## 2009-03-09 ENCOUNTER — Encounter: Admission: RE | Admit: 2009-03-09 | Discharge: 2009-03-09 | Payer: Self-pay | Admitting: Family Medicine

## 2009-04-15 ENCOUNTER — Ambulatory Visit: Payer: Self-pay | Admitting: Radiology

## 2009-04-15 ENCOUNTER — Ambulatory Visit: Payer: Self-pay | Admitting: Critical Care Medicine

## 2009-04-15 ENCOUNTER — Encounter: Payer: Self-pay | Admitting: Critical Care Medicine

## 2009-04-15 ENCOUNTER — Inpatient Hospital Stay (HOSPITAL_COMMUNITY): Admission: AD | Admit: 2009-04-15 | Discharge: 2009-04-20 | Payer: Self-pay | Admitting: Critical Care Medicine

## 2009-04-19 ENCOUNTER — Encounter: Payer: Self-pay | Admitting: Critical Care Medicine

## 2009-04-19 ENCOUNTER — Ambulatory Visit: Payer: Self-pay | Admitting: Surgery

## 2009-04-27 ENCOUNTER — Encounter: Payer: Self-pay | Admitting: Critical Care Medicine

## 2009-05-06 ENCOUNTER — Ambulatory Visit: Payer: Self-pay | Admitting: Critical Care Medicine

## 2009-05-25 ENCOUNTER — Ambulatory Visit: Payer: Self-pay | Admitting: Oncology

## 2009-05-27 ENCOUNTER — Encounter: Payer: Self-pay | Admitting: Critical Care Medicine

## 2009-05-27 LAB — CBC WITH DIFFERENTIAL/PLATELET
BASO%: 0.6 % (ref 0.0–2.0)
LYMPH%: 27 % (ref 14.0–49.7)
MCHC: 34.6 g/dL (ref 31.5–36.0)
MONO#: 0.7 10*3/uL (ref 0.1–0.9)
Platelets: 270 10*3/uL (ref 145–400)
RBC: 3.89 10*6/uL (ref 3.70–5.45)
WBC: 8.2 10*3/uL (ref 3.9–10.3)
lymph#: 2.2 10*3/uL (ref 0.9–3.3)

## 2009-05-27 LAB — COMPREHENSIVE METABOLIC PANEL
ALT: 33 U/L (ref 0–35)
Alkaline Phosphatase: 86 U/L (ref 39–117)
CO2: 28 mEq/L (ref 19–32)
Sodium: 137 mEq/L (ref 135–145)
Total Bilirubin: 0.6 mg/dL (ref 0.3–1.2)
Total Protein: 6.7 g/dL (ref 6.0–8.3)

## 2009-06-01 ENCOUNTER — Telehealth: Payer: Self-pay | Admitting: Family Medicine

## 2009-06-13 ENCOUNTER — Emergency Department: Payer: Self-pay | Admitting: Emergency Medicine

## 2009-06-16 ENCOUNTER — Ambulatory Visit: Payer: Self-pay | Admitting: Internal Medicine

## 2009-06-21 ENCOUNTER — Ambulatory Visit: Payer: Self-pay | Admitting: Pulmonary Disease

## 2009-06-21 ENCOUNTER — Encounter: Payer: Self-pay | Admitting: Family Medicine

## 2009-06-21 ENCOUNTER — Inpatient Hospital Stay (HOSPITAL_COMMUNITY): Admission: AD | Admit: 2009-06-21 | Discharge: 2009-06-24 | Payer: Self-pay | Admitting: Orthopedic Surgery

## 2009-06-25 ENCOUNTER — Ambulatory Visit: Payer: Self-pay | Admitting: Internal Medicine

## 2009-06-25 ENCOUNTER — Telehealth: Payer: Self-pay | Admitting: Family Medicine

## 2009-06-29 ENCOUNTER — Ambulatory Visit: Payer: Self-pay | Admitting: Internal Medicine

## 2009-07-01 ENCOUNTER — Encounter: Payer: Self-pay | Admitting: Family Medicine

## 2009-07-09 ENCOUNTER — Ambulatory Visit: Payer: Self-pay | Admitting: Family Medicine

## 2009-07-22 ENCOUNTER — Encounter: Payer: Self-pay | Admitting: Family Medicine

## 2009-08-23 ENCOUNTER — Ambulatory Visit: Payer: Self-pay | Admitting: Internal Medicine

## 2009-08-23 ENCOUNTER — Encounter: Payer: Self-pay | Admitting: Family Medicine

## 2009-08-30 ENCOUNTER — Ambulatory Visit: Payer: Self-pay | Admitting: Family Medicine

## 2009-08-30 DIAGNOSIS — R300 Dysuria: Secondary | ICD-10-CM | POA: Insufficient documentation

## 2009-08-30 LAB — CONVERTED CEMR LAB
Ketones, urine, test strip: NEGATIVE
Nitrite: POSITIVE
Protein, U semiquant: NEGATIVE
Urobilinogen, UA: 0.2

## 2009-08-31 ENCOUNTER — Encounter: Payer: Self-pay | Admitting: Family Medicine

## 2009-09-09 ENCOUNTER — Ambulatory Visit: Payer: Self-pay | Admitting: Critical Care Medicine

## 2009-09-13 ENCOUNTER — Encounter: Payer: Self-pay | Admitting: Critical Care Medicine

## 2009-09-22 ENCOUNTER — Encounter: Payer: Self-pay | Admitting: Family Medicine

## 2009-09-23 ENCOUNTER — Encounter: Payer: Self-pay | Admitting: Critical Care Medicine

## 2009-09-26 ENCOUNTER — Telehealth: Payer: Self-pay | Admitting: Critical Care Medicine

## 2009-11-01 ENCOUNTER — Encounter: Payer: Self-pay | Admitting: Critical Care Medicine

## 2009-11-03 ENCOUNTER — Encounter: Payer: Self-pay | Admitting: Family Medicine

## 2009-11-19 ENCOUNTER — Ambulatory Visit: Payer: Self-pay | Admitting: Oncology

## 2009-11-24 ENCOUNTER — Encounter: Payer: Self-pay | Admitting: Family Medicine

## 2009-12-03 ENCOUNTER — Encounter: Payer: Self-pay | Admitting: Family Medicine

## 2009-12-07 ENCOUNTER — Telehealth: Payer: Self-pay | Admitting: Family Medicine

## 2009-12-09 ENCOUNTER — Ambulatory Visit: Payer: Self-pay | Admitting: Critical Care Medicine

## 2009-12-24 ENCOUNTER — Ambulatory Visit: Payer: Self-pay | Admitting: Internal Medicine

## 2009-12-28 ENCOUNTER — Telehealth: Payer: Self-pay | Admitting: Internal Medicine

## 2009-12-29 ENCOUNTER — Encounter (INDEPENDENT_AMBULATORY_CARE_PROVIDER_SITE_OTHER): Payer: Self-pay | Admitting: *Deleted

## 2010-01-05 ENCOUNTER — Encounter: Payer: Self-pay | Admitting: Family Medicine

## 2010-01-19 ENCOUNTER — Ambulatory Visit (HOSPITAL_COMMUNITY)
Admission: RE | Admit: 2010-01-19 | Discharge: 2010-01-19 | Payer: Self-pay | Source: Home / Self Care | Attending: Internal Medicine | Admitting: Internal Medicine

## 2010-01-19 ENCOUNTER — Encounter: Payer: Self-pay | Admitting: Internal Medicine

## 2010-02-24 ENCOUNTER — Ambulatory Visit
Admission: RE | Admit: 2010-02-24 | Discharge: 2010-02-24 | Payer: Self-pay | Source: Home / Self Care | Attending: Critical Care Medicine | Admitting: Critical Care Medicine

## 2010-02-26 ENCOUNTER — Other Ambulatory Visit: Payer: Self-pay | Admitting: Family Medicine

## 2010-02-26 ENCOUNTER — Encounter: Payer: Self-pay | Admitting: Obstetrics and Gynecology

## 2010-02-26 DIAGNOSIS — Z0000 Encounter for general adult medical examination without abnormal findings: Secondary | ICD-10-CM

## 2010-02-27 ENCOUNTER — Encounter: Payer: Self-pay | Admitting: General Surgery

## 2010-03-08 NOTE — Assessment & Plan Note (Signed)
Summary: dysphagia, liquids and solids...as.    History of Present Illness Visit Type: Follow-up Visit Primary GI MD: Stan Head MD [facility] Primary Sahithi Ordoyne: Joycelyn Man Requesting Oaklie Durrett: Marlowe Sax Chief Complaint: dysphagia with solids and liquids History of Present Illness:   77 yo ww with multiple medical problems inccluding multiple sclerosis. Last seen in 2009 (at hospital), for GERD and dysphagia. Esophageal dilation for stenotic upper esophageal sphincter then was effective. Over the past several months or so she has decveloped recurrent dysphagia. She had a fall and upper extremity fractures with rehab stay earlier this year. She is having solid food dysphagia, and drinking behind solid food boluses to help food pass. She also "chokes" but apparently does not clearly aspirate.  Modified Barium Swallow 3/11 apparently negative Feels like she needs another dilation.  Daughter, [name], RN also provides history details.   GI Review of Systems    Reports dysphagia with liquids and  dysphagia with solids.      Denies abdominal pain, acid reflux, belching, bloating, chest pain, heartburn, loss of appetite, nausea, vomiting, vomiting blood, weight loss, and  weight gain.        Denies anal fissure, black tarry stools, change in bowel habit, constipation, diarrhea, diverticulosis, fecal incontinence, heme positive stool, hemorrhoids, irritable bowel syndrome, jaundice, light color stool, liver problems, rectal bleeding, and  rectal pain.    EGD  Procedure date:  07/22/2007  Findings:      1) STENOSIS OF UPPER ESOPHAGEAL SPHINCTER (DILATED TO 54 FR) 2) MILDLY TORTUOUS ESOPHAGUS 3) MULTIPLE BENIGN-APPEARING PROXIMAL GASTRIC POLYPS, SUSPECT THESE ARE FUNDIC GLAND POLYPS, BIOPSY TAKEN 4) NO OTHER ABNORMALITIES  STOMACH, POLYP, BIOPSY:  FUNDIC GLAND POLYP, NO HELICOBACTER PYLORI, INTESTINAL METAPLASIA, DYSPLASIA OR MALIGNANCY IDENTIFIED.   Current Medications  (verified): 1)  Spiriva Handihaler 18 Mcg Caps (Tiotropium Bromide Monohydrate) .... Inhale Contents of 1 Capsule Once A Day 2)  Zetia 10 Mg  Tabs (Ezetimibe) .Marland Kitchen.. 1 Tablet At Bedtime 3)  Aspirin 81 Mg  Tbec (Aspirin) .... Take One By Mouth Once A Day 4)  Protonix 40 Mg  Tbec (Pantoprazole Sodium) .... One By Mouth Two Times A Day 5)  Metformin Hcl 500 Mg  Tb24 (Metformin Hcl) .Marland Kitchen.. 1 At Bedtime 6)  Cozaar 50 Mg  Tabs (Losartan Potassium) .... Take 1 Tablet By Mouth Once A Day 7)  Catapres 0.3 Mg Tabs (Clonidine Hcl) .... One Tab By Mouth Two Times A Day 8)  Effexor Xr 75 Mg  Cp24 (Venlafaxine Hcl) .... Take One By Mouth Once A Day 9)  Calcium Carbonate-Vitamin D 600-400 Mg-Unit  Tabs (Calcium Carbonate-Vitamin D) .... Take 1 Tablet By Mouth Two Times A Day 10)  Magnesium 250 Mg Tabs (Magnesium) .... Take 1 Tablet By Mouth Once A Day 11)  Multivitamins   Tabs (Multiple Vitamin) .... Take 1 Tablet By Mouth Once A Day 12)  Cyclobenzaprine Hcl 10 Mg Tabs (Cyclobenzaprine Hcl) .... Take 1 Tablet By Mouth Two Times A Day 13)  Tylenol Pm Extra Strength 500-25 Mg Tabs (Diphenhydramine-Apap (Sleep)) .... Take 1 Tab By Mouth At Bedtime 14)  Synthroid 137 Mcg  Tabs (Levothyroxine Sodium) .... One By Mouth Once Daily 15)  Miralax   Powd (Polyethylene Glycol 3350) .Marland Kitchen.. 1 Packet Daily As Needed 16)  Lasix 20 Mg Tabs (Furosemide) .Marland Kitchen.. 1 By Mouth Once Daily As Needed Edema 17)  Aleve 220 Mg Tabs (Naproxen Sodium) .... Per Bottle 18)  Vicodin 5-500 Mg Tabs (Hydrocodone-Acetaminophen) .... Take 1 Tablet By Mouth  Every 4-6 Hours As Needed 19)  Tessalon 200 Mg Caps (Benzonatate) .Marland Kitchen.. 1 By Mouth Three Times A Day As Needed 20)  Albuterol Sulfate (2.5 Mg/55ml) 0.083% Nebu (Albuterol Sulfate) .Marland Kitchen.. 1 Vial Every 4-6 Hours As Needed 21)  Tussionex Pennkinetic Er 8-10 Mg/7ml Lqcr (Chlorpheniramine-Hydrocodone) .Marland Kitchen.. 1 Tsp Every 12 Hr As Needed Cough, May Make You Sleepy 22)  Famotidine 20 Mg Tabs (Famotidine) .... Take 1  Tab By Mouth At Bedtime 23)  Probiotic  Caps (Probiotic Product) .... Take 1 Tablet By Mouth Two Times A Day 24)  Toviaz 4 Mg Xr24h-Tab (Fesoterodine Fumarate) .Marland Kitchen.. 1 Tab in Am  Allergies (verified): 1)  ! Avelox  Past History:  Past Medical History: [name] (ICD-695.3) OBESITY FATTY LIVER DISEASE VIA CT (ICD-571.8) GERD and UES STENOSIS (dilated in past) DIABETES MELLITUS, TYPE II (ICD-250.00) POSITIVE PPD (ICD-795.5) INVOLUNTARY MOVEMENTS, ABNORMAL (ICD-781.0) HYPERCHOLESTEROLEMIA / TRIG  (235/374) (ICD-272.0) HYPOTHYROIDISM (ICD-244.9) URINARY INCONTINENCE (ICD-788.30) SCLEROSIS, MULTIPLE (ICD-340) ADENOCARCINOMA, BREAST (ICD-174.9) VOCAL CORD DISORDER (ICD-478.5) HYPERTENSION (ICD-401.9) DEPRESSION (ICD-311) CORONARY ARTERY DISEASE, MINIMAL (ICD-414.00) COPD (ICD-496) ASTHMA (ICD-493.90) Ankle pain per Dr. Dion Saucier COMMUNITY ACQUIRED PNEUMONIA 3/11  Past Surgical History: Reviewed history from 11/11/2009 and no changes required. Appy 1942 Tonsillectomy 1951 D&C 1964/5 NSVD x 5 BTL 1970 Head CT- chronic sinusitis 02/99 Enoscopy normal except hiatal hernia 1980's Barium swallow- wnl 1980's MRI Head- sm vess dz, , not type of M.S. 01/99 Sinus Surgery 11/1997 Bone density wnl 01/12/98 [facility] hospitalization 05/15-22/01 Head MRI ([name]) wnl 06/01 Adenosine Cardiolite wnl 07/01 Radiologic renal cyst aspiration , left 06/04/00 CT Pelvis, left renal cyst, right sm renal cyst, fatty liver 04/02 Colonoscopy, polyps 04/02 EGD, H.H. 04/02 CT Pelvis (via Charlann Boxer) left renal cyst 02/04 Holter wnl 07/14/99 Cath, wnl (Dr. Katrinka Blazing) 12/03/02 Colonoscopy 3mm polyp destroyed 10/22/03 Right breast lumpectomy, ductal cancer 01/06 MRI of bilat breast, wnl 03/07/06 HOSP R Bimalleolar Ankle Fx/ repair     L Hand 3&4 Metacarpal Fxs    5/16-5/19/2011  Family History: Reviewed history from 05/31/2007 and no changes required. Father: Died at age 75 of coronary disease Mother: Died at age  5 of cancer of the pancreas with diabetes and emphysema in a nonsmoker Siblings: 2 brothers, twins, both with high blood pressure and diabetes, Leukemia. 3 sisters, all deceased, one with nonalcoholic cirrhosis, one with cancer of the pancreas, one with lymphoma CV - none HBP + 2 sisters, 2 brothers DM + sisters, 2 brothers Lymphoma + sister Thyroid cancer + daughter Cancer pancr + mother Liver and pancr cancer + sister Cirrhosis + sister  Social History: Reviewed history from 10/05/2008 and no changes required. Marital Status: Married Children: 5 Occupation: Housewife, former Engineer, building services.  Lives with her husband at [facility] Daughter is [name], Charity fundraiser (former Engineer, civil (consulting) for [employer]) former smoker - quit in 1986, x57yrs, 1-1.5ppd  Review of Systems       no acute respiratory sxs weak in lower extremities  Vital Signs:  Patient profile:   77 year old female Height:      64 inches Weight:      239 pounds BMI:     41.17 Pulse rate:   88 / minute Pulse rhythm:   regular BP sitting:   148 / 84  (left arm)  Vitals Entered By: Milford Cage NCMA (December 24, 2009 3:46 PM)  Physical Exam  General:  obese, NAD Eyes:  anicteric Lungs:  coarse but clear breath sounds E=I, fair to good air movement Heart:  Regular rate  and rhythm; no murmurs, rubs,  or bruits. Msk:  unable to getr onto exam table due to lower extremity, hip weakness Neurologic:  Alert and  oriented x3 Psych:  Alert and cooperative. Normal mood and affect.   Impression & Recommendations:  Problem # 1:  DYSPHAGIA (ICD-787.29) Assessment Deteriorated UES stenosis in past and has responded to dilation plan for EGD/dilation at hospital due to comorbidities but will need to search for a date in December to schedule  Problem # 2:  GERD (ICD-530.81) continue PPI  Patient Instructions: 1)  We will call you to schedule your procedure-EGD. 2)  Upper Endoscopy with Dilatation brochure given.  3)  The medication  list was reviewed and reconciled.  All changed / newly prescribed medications were explained.  A complete medication list was provided to the patient / caregiver.

## 2010-03-08 NOTE — Assessment & Plan Note (Signed)
Summary: ? UTI   Vital Signs:  Patient profile:   77 year old female Height:      64 inches Temp:     98.1 degrees F oral Pulse rate:   76 / minute Pulse rhythm:   regular BP sitting:   140 / 82  (left arm) Cuff size:   large  Vitals Entered By: Linde Gillis CMA Duncan Dull) (August 30, 2009 12:16 PM) CC: ? UTI Comments Patient refused to weigh because she is recovering from a broken foot   History of Present Illness: 77 yo new to me here for ? UTI.  Was recently discharged from SNF s/p rehab from right ankle fracture and left metacarpal fracture. Had an indwelling urinary catheter for 2 months during rehab.  Two days ago noticed mild dysuria and increased urinary frequency. No fevers, chill, nausea, vomiting or back pain.  Current Medications (verified): 1)  Spiriva Handihaler 18 Mcg Caps (Tiotropium Bromide Monohydrate) .... Inhale Contents of 1 Capsule Once A Day 2)  Zetia 10 Mg  Tabs (Ezetimibe) .Marland Kitchen.. 1 Tablet At Bedtime 3)  Aspirin 81 Mg  Tbec (Aspirin) .... Take One By Mouth Once A Day 4)  Arimidex 1 Mg  Tabs (Anastrozole) .... Take 1 Tablet By Mouth Once A Day 5)  Protonix 40 Mg  Tbec (Pantoprazole Sodium) .... One By Mouth Two Times A Day 6)  Metformin Hcl 500 Mg  Tb24 (Metformin Hcl) .Marland Kitchen.. 1 At Bedtime 7)  Cozaar 50 Mg  Tabs (Losartan Potassium) .... Take 1 Tablet By Mouth Once A Day 8)  Catapres 0.3 Mg Tabs (Clonidine Hcl) .... One Tab By Mouth Two Times A Day 9)  Effexor Xr 75 Mg  Cp24 (Venlafaxine Hcl) .... Take One By Mouth Once A Day 10)  Calcium Carbonate-Vitamin D 600-400 Mg-Unit  Tabs (Calcium Carbonate-Vitamin D) .... Take 1 Tablet By Mouth Two Times A Day 11)  Magnesium 250 Mg Tabs (Magnesium) .... Take 1 Tablet By Mouth Once A Day 12)  Multivitamins   Tabs (Multiple Vitamin) .... Take 1 Tablet By Mouth Once A Day 13)  Cyclobenzaprine Hcl 10 Mg Tabs (Cyclobenzaprine Hcl) .... Take 1 Tablet By Mouth Two Times A Day 14)  Tylenol Pm Extra Strength 500-25 Mg Tabs  (Diphenhydramine-Apap (Sleep)) .... Take 1 Tab By Mouth At Bedtime 15)  Synthroid 137 Mcg  Tabs (Levothyroxine Sodium) .... One By Mouth Once Daily 16)  Miralax   Powd (Polyethylene Glycol 3350) .Marland Kitchen.. 1 Packet Daily As Needed 17)  Lasix 20 Mg Tabs (Furosemide) .Marland Kitchen.. 1 By Mouth Once Daily As Needed Edema 18)  Aleve 220 Mg Tabs (Naproxen Sodium) .... Per Bottle 19)  Vicodin 5-500 Mg Tabs (Hydrocodone-Acetaminophen) .... Take 1 Tablet By Mouth Every 4-6 Hours As Needed 20)  Tessalon 200 Mg Caps (Benzonatate) .Marland Kitchen.. 1 By Mouth Three Times A Day As Needed 21)  Albuterol Sulfate (2.5 Mg/70ml) 0.083% Nebu (Albuterol Sulfate) .Marland Kitchen.. 1 Vial Every 4-6 Hours As Needed 22)  Tussionex Pennkinetic Er 8-10 Mg/39ml Lqcr (Chlorpheniramine-Hydrocodone) .Marland Kitchen.. 1 Tsp Every 12 Hr As Needed Cough, May Make You Sleepy 23)  Famotidine 20 Mg Tabs (Famotidine) .... Take 1 Tab By Mouth At Bedtime 24)  Probiotic  Caps (Probiotic Product) .... Take 1 Tablet By Mouth Two Times A Day 25)  Ciprofloxacin Hcl 500 Mg Tabs (Ciprofloxacin Hcl) .Marland Kitchen.. 1 Tab By Mouth Two Times A Day X 10 Days  Allergies: 1)  ! Avelox  Past History:  Past Medical History: Last updated: 07/05/2007 [name] (ICD-695.3) FATTY  LIVER DISEASE VIA CT (ICD-571.8) GERD (ICD-530.81) DIABETES MELLITUS, TYPE II (ICD-250.00) POSITIVE PPD (ICD-795.5) INVOLUNTARY MOVEMENTS, ABNORMAL (ICD-781.0) HYPERCHOLESTEROLEMIA / TRIG  (235/374) (ICD-272.0) HYPOTHYROIDISM (ICD-244.9) BRONCHITIS, ACUTE (ICD-466.0) URINARY INCONTINENCE (ICD-788.30) SCLEROSIS, MULTIPLE (ICD-340) ADENOCARCINOMA, BREAST (ICD-174.9) VOCAL CORD DISORDER (ICD-478.5) HYPERTENSION (ICD-401.9) DEPRESSION (ICD-311) CORONARY ARTERY DISEASE, MINIMAL (ICD-414.00) COPD (ICD-496) ASTHMA (ICD-493.90)  Past Surgical History: Last updated: 06/24/2009 Appy 1942 Tonsillectomy 1951 D&C 1964/5 NSVD x 5 BTL 1970 Head CT- chronic sinusitis 02/99 Enoscopy normal except hiatal hernia 1980's Barium swallow-  wnl 1980's MRI Head- sm vess dz, , not type of M.S. 01/99 Sinus Surgery 11/1997 Bone density wnl 01/12/98 [facility] hospitalization 05/15-22/01 Head MRI ([name]) wnl 06/01 Adenosine Cardiolite wnl 07/01 Radiologic renal cyst aspiration , left 06/04/00 CT Pelvis, left renal cyst, right sm renal cyst, fatty liver 04/02 Colonoscopy, polyps 04/02 EGD, H.H. 04/02 CT Pelvis (via Charlann Boxer) left renal cyst 02/04 Holter wnl 07/14/99 Cath, wnl (Dr. Katrinka Blazing) 12/03/02 Colonoscopy 3mm polyp destroyed 10/22/03 Right breast lumpectomy, ductal cancer 01/06 MRI of bilat breast, wnl 03/07/06 HOSP R Bimalleolar Ankle Fx/ repair     L Hand 3&4 Metacarpal Fxs    5/16-5/19/2011  Family History: Last updated: 2007/06/05 Father: Died at age 27 of coronary disease Mother: Died at age 64 of cancer of the pancreas with diabetes and emphysema in a nonsmoker Siblings: 2 brothers, twins, both with high blood pressure and diabetes, Leukemia. 3 sisters, all deceased, one with nonalcoholic cirrhosis, one with cancer of the pancreas, one with lymphoma CV - none HBP + 2 sisters, 2 brothers DM + sisters, 2 brothers Lymphoma + sister Thyroid cancer + daughter Cancer pancr + mother Liver and pancr cancer + sister Cirrhosis + sister  Social History: Last updated: 10/05/2008 Marital Status: Married Children: 5 Occupation: Orthoptist, former Engineer, building services.  Lives with her husband at [facility] Daughter is [name], Charity fundraiser (former Engineer, civil (consulting) for [employer]) former smoker - quit in 1986, x13yrs, 1-1.5ppd  Risk Factors: Alcohol Use: 2 (05-Jun-2007) Caffeine Use: 1 (05-Jun-2007) Exercise: no (05-Jun-2007)  Risk Factors: Smoking Status: quit > 6 months (05/06/2009) Passive Smoke Exposure: no (05-Jun-2007)  Review of Systems      See HPI General:  Denies chills and fever. GI:  Denies nausea and vomiting. GU:  Complains of discharge and urinary frequency; denies hematuria and incontinence.  Physical Exam  General:  Well developed  obese white female , at times comfortable and in no distress but most of the time dyspneic and wheezing audibly with any kind of activity.  Mouth:  MMM Abdomen:  NO CVA or suprapubic tenderness Psych:  Cognition and judgment appear intact. Alert and cooperative with normal attention span and concentration. No apparent delusions, illusions, hallucinations   Impression & Recommendations:  Problem # 1:  ACUTE CYSTITIS (ICD-595.0) Assessment New complicated by recent indwelling catheter, SNF and hospital stay. Will send for cutlure, start on Cipro. (of note, Avelox causes GI upset, has had cipro in past). Her updated medication list for this problem includes:    Ciprofloxacin Hcl 500 Mg Tabs (Ciprofloxacin hcl) .Marland Kitchen... 1 tab by mouth two times a day x 10 days  Orders: T-Culture, Urine ([id number]) Prescription Created Electronically [id number])  Complete Medication List: 1)  Spiriva Handihaler 18 Mcg Caps (Tiotropium bromide monohydrate) .... Inhale contents of 1 capsule once a day 2)  Zetia 10 Mg Tabs (Ezetimibe) .Marland Kitchen.. 1 tablet at bedtime 3)  Aspirin 81 Mg Tbec (Aspirin) .... Take one by mouth once a day 4)  Arimidex 1 Mg  Tabs (Anastrozole) .... Take 1 tablet by mouth once a day 5)  Protonix 40 Mg Tbec (Pantoprazole sodium) .... One by mouth two times a day 6)  Metformin Hcl 500 Mg Tb24 (Metformin hcl) .Marland Kitchen.. 1 at bedtime 7)  Cozaar 50 Mg Tabs (Losartan potassium) .... Take 1 tablet by mouth once a day 8)  Catapres 0.3 Mg Tabs (Clonidine hcl) .... One tab by mouth two times a day 9)  Effexor Xr 75 Mg Cp24 (Venlafaxine hcl) .... Take one by mouth once a day 10)  Calcium Carbonate-vitamin D 600-400 Mg-unit Tabs (Calcium carbonate-vitamin d) .... Take 1 tablet by mouth two times a day 11)  Magnesium 250 Mg Tabs (Magnesium) .... Take 1 tablet by mouth once a day 12)  Multivitamins Tabs (Multiple vitamin) .... Take 1 tablet by mouth once a day 13)  Cyclobenzaprine Hcl 10 Mg Tabs (Cyclobenzaprine hcl)  .... Take 1 tablet by mouth two times a day 14)  Tylenol Pm Extra Strength 500-25 Mg Tabs (Diphenhydramine-apap (sleep)) .... Take 1 tab by mouth at bedtime 15)  Synthroid 137 Mcg Tabs (Levothyroxine sodium) .... One by mouth once daily 16)  Miralax Powd (Polyethylene glycol 3350) .Marland Kitchen.. 1 packet daily as needed 17)  Lasix 20 Mg Tabs (Furosemide) .Marland Kitchen.. 1 by mouth once daily as needed edema 18)  Aleve 220 Mg Tabs (Naproxen sodium) .... Per bottle 19)  Vicodin 5-500 Mg Tabs (Hydrocodone-acetaminophen) .... Take 1 tablet by mouth every 4-6 hours as needed 20)  Tessalon 200 Mg Caps (Benzonatate) .Marland Kitchen.. 1 by mouth three times a day as needed 21)  Albuterol Sulfate (2.5 Mg/75ml) 0.083% Nebu (Albuterol sulfate) .Marland Kitchen.. 1 vial every 4-6 hours as needed 22)  Tussionex Pennkinetic Er 8-10 Mg/46ml Lqcr (Chlorpheniramine-hydrocodone) .Marland Kitchen.. 1 tsp every 12 hr as needed cough, may make you sleepy 23)  Famotidine 20 Mg Tabs (Famotidine) .... Take 1 tab by mouth at bedtime 24)  Probiotic Caps (Probiotic product) .... Take 1 tablet by mouth two times a day 25)  Ciprofloxacin Hcl 500 Mg Tabs (Ciprofloxacin hcl) .Marland Kitchen.. 1 tab by mouth two times a day x 10 days  Other Orders: UA Dipstick w/o Micro (manual) ([id number]) Prescriptions: CIPROFLOXACIN HCL 500 MG TABS (CIPROFLOXACIN HCL) 1 tab by mouth two times a day x 10 days  #20 x 0   Entered and Authorized by:   [name] MD   Signed by:   [name] MD on 08/30/2009   Method used:   Electronically to        [facility] [address] (retail)       [address] [place], [place]  [address]       Ph: [phone]       Fax: [phone]   RxID:   [id number]   Current Allergies (reviewed today): ! [facility] Laboratory Results   Urine Tests  Date/Time Received: August 30, 2009 12:31 PM   Routine Urinalysis   Color: yellow Appearance: Hazy Glucose: negative   (Normal Range: Negative) Bilirubin: negative   (Normal Range:  Negative) Ketone: negative   (Normal Range: Negative) Spec. Gravity: 1.010   (Normal Range: 1.003-1.035) Blood: moderate   (Normal Range: Negative) pH: 5.0   (Normal Range: 5.0-8.0) Protein: negative   (Normal Range: Negative) Urobilinogen: 0.2   (Normal Range: 0-1) Nitrite: positive   (Normal Range: Negative) Leukocyte Esterace: large   (Normal Range: Negative)  Appended Document: ? UTI

## 2010-03-08 NOTE — Letter (Signed)
Summary: EGD Instructions  [facility]  [address] [place], [place] [address]   Phone: [phone]  Fax: [phone]       Ana Nunez    1929-06-08    MRN: [id number]       Procedure Day Dorna Bloom: [name], 01/19/10     Arrival Time: 10:30 AM     Procedure Time: 11:45 AM     Location of Procedure:                    _X_ [facility] ( Outpatient Registration)   PREPARATION FOR ENDOSCOPY   On [facility], 01/19/10, THE DAY OF THE PROCEDURE:  1.   No solid foods, milk or milk products are allowed after midnight the night before your procedure.  2.   Do not drink anything colored red or purple.  Avoid juices with pulp.  No orange juice.  3.  You may drink clear liquids until 7:45 AM, which is 4 hours before your procedure.                                                                                                CLEAR LIQUIDS INCLUDE: Water Jello Ice Popsicles Tea (sugar ok, no milk/cream) Powdered fruit flavored drinks Coffee (sugar ok, no milk/cream) Gatorade Juice: apple, white grape, white cranberry  Lemonade Clear bullion, consomm, broth Carbonated beverages (any kind) Strained chicken noodle soup Hard Candy   MEDICATION INSTRUCTIONS  Unless otherwise instructed, you should take regular prescription medications with a small sip of water as early as possible the morning of your procedure.  Diabetic patients - see separate instructions.               OTHER INSTRUCTIONS  You will need a responsible adult at least 75 years of age to accompany you and drive you home.   This person must remain in the waiting room during your procedure.  Wear loose fitting clothing that is easily removed.  Leave jewelry and other valuables at home.  However, you may wish to bring a book to read or an iPod/MP3 player to listen to music as you wait for your procedure to start.  Remove all body piercing jewelry and leave at home.  Total time  from sign-in until discharge is approximately 2-3 hours.  You should go home directly after your procedure and rest.  You can resume normal activities the day after your procedure.  The day of your procedure you should not:   Drive   Make legal decisions   Operate machinery   Drink alcohol   Return to work  You will receive specific instructions about eating, activities and medications before you leave.    The above instructions have been reviewed and explained to me by   Francee Piccolo, CMA (AAMA)     I fully understand and can verbalize these instructions reviewed via phone w/ pt daughter Date 12/29/09     Appended Document: EGD Instructions printed and mailed to pt's daughter

## 2010-03-08 NOTE — Letter (Signed)
Summary: [facility]  [facility]   Imported By: Sherian Rein 09/17/2009 15:08:44  _____________________________________________________________________  External Attachment:    Type:   Image     Comment:   External Document

## 2010-03-08 NOTE — Letter (Signed)
Summary: Dr.Joshua Landau,Orthopaedic,Note  Dr.Joshua Landau,Orthopaedic,Note   Imported By: Beau Fanny 07/02/2009 16:23:40  _____________________________________________________________________  External Attachment:    Type:   Image     Comment:   External Document

## 2010-03-08 NOTE — Progress Notes (Signed)
----   Converted from flag ---- ---- 02/25/2009 9:31 AM, Mills Koller wrote:   ---- 02/24/2009 5:18 PM, Shaune Leeks MD wrote: Is poss to add B12 lvl to labs? ------------------------------

## 2010-03-08 NOTE — Letter (Signed)
Summary: [name]  [name]   Imported By: Lanelle Bal 08/27/2009 10:54:53  _____________________________________________________________________  External Attachment:    Type:   Image     Comment:   External Document  Appended Document: [name]     Clinical Lists Changes

## 2010-03-08 NOTE — Miscellaneous (Signed)
Summary: CXR   Clinical Lists Changes  Observations: Added new observation of CXR RESULTS: Findings: Focal airspace opacity in the lingular segment of the left upper lobe.  Chronic bronchitic markings.   Normal cardiomediastinal silhouette.  No pleural effusion.  Bony thorax intact.  Focal benign eventration of the anterior aspect of the right hemidiaphragm.   IMPRESSION: Lingular pneumonia.   (04/15/2009 12:29)      CXR  Procedure date:  04/15/2009  Findings:      Findings: Focal airspace opacity in the lingular segment of the left upper lobe.  Chronic bronchitic markings.   Normal cardiomediastinal silhouette.  No pleural effusion.  Bony thorax intact.  Focal benign eventration of the anterior aspect of the right hemidiaphragm.   IMPRESSION: Lingular pneumonia.

## 2010-03-08 NOTE — Assessment & Plan Note (Signed)
Summary: Admission History and Physical   Visit Type:  Sick Visit Primary Provider/Referring Provider:  Joycelyn Man  CC:  Pt c/o wheezing, S.O.B all the time, and productive cough with small amouts of yellow mucus.  History of Present Illness: 77  yo WF with hx of AB/COPD, VCD , and MS  April 15, 2009 11:48 AM Worse since end of last week over two week.  Started ?viral illness.  Skin hurts to touch, chills, low grade fever,  cough is productive yellow and green.  Uses tussionex/tessalon three times a day.   Feels congested and chest felt heavy.  Notes foul odor to urine and dark urine with dysuria. CXR in the office reveals PNA bilateral LL.  Pt admitted for inpatient care .  Asthma History    Initial Asthma Severity Rating:    Age range: 12+ years    Symptoms: throughout the day    Nighttime Awakenings: >1/week but not nightly    Interferes w/ normal activity: extreme limitations    SABA use (not for EIB): daily    Exacerbations requiring oral systemic steroids: 2 or more/year    Asthma Severity Assessment: Severe Persistent   Preventive Screening-Counseling & Management  Alcohol-Tobacco     Smoking Status: quit > 6 months  Current Medications (verified): 1)  Spiriva Handihaler 18 Mcg Caps (Tiotropium Bromide Monohydrate) .... Inhale Contents of 1 Capsule Once A Day 2)  Zetia 10 Mg  Tabs (Ezetimibe) .Marland Kitchen.. 1 Tablet At Bedtime 3)  Aspirin 81 Mg  Tbec (Aspirin) .... Take One By Mouth Once A Day 4)  Arimidex 1 Mg  Tabs (Anastrozole) .... Take 1 Tablet By Mouth Once A Day 5)  Protonix 40 Mg  Tbec (Pantoprazole Sodium) .... One By Mouth Two Times A Day 6)  Metformin Hcl 500 Mg  Tb24 (Metformin Hcl) .Marland Kitchen.. 1 At Bedtime 7)  Cozaar 50 Mg  Tabs (Losartan Potassium) .... Take 1 Tablet By Mouth Once A Day 8)  Catapres 0.3 Mg Tabs (Clonidine Hcl) .... One Tab By Mouth Two Times A Day 9)  Effexor Xr 75 Mg  Cp24 (Venlafaxine Hcl) .... Take One By Mouth Once A Day 10)  Calcium  Carbonate-Vitamin D 600-400 Mg-Unit  Tabs (Calcium Carbonate-Vitamin D) .... Take 1 Tablet By Mouth Two Times A Day 11)  Magnesium 250 Mg Tabs (Magnesium) .... Take 1 Tablet By Mouth Once A Day 12)  Multivitamins   Tabs (Multiple Vitamin) .... Take 1 Tablet By Mouth Once A Day 13)  Cyclobenzaprine Hcl 10 Mg Tabs (Cyclobenzaprine Hcl) .... Take 1 Tablet By Mouth Two Times A Day 14)  Tylenol Pm Extra Strength 500-25 Mg Tabs (Diphenhydramine-Apap (Sleep)) .... Take 1 Tab By Mouth At Bedtime 15)  Synthroid 137 Mcg  Tabs (Levothyroxine Sodium) .... One By Mouth Once Daily 16)  Miralax   Powd (Polyethylene Glycol 3350) .Marland Kitchen.. 1 Packet Daily As Needed 17)  Lasix 20 Mg Tabs (Furosemide) .Marland Kitchen.. 1 By Mouth Once Daily As Needed Edema 18)  Aleve 220 Mg Tabs (Naproxen Sodium) .... Per Bottle 19)  Vicodin 5-500 Mg Tabs (Hydrocodone-Acetaminophen) .... Take 1 Tablet By Mouth Every 4-6 Hours As Needed 20)  Tessalon 200 Mg Caps (Benzonatate) .Marland Kitchen.. 1 By Mouth Three Times A Day 21)  Albuterol Sulfate (2.5 Mg/33ml) 0.083% Nebu (Albuterol Sulfate) .Marland Kitchen.. 1 Vial Every 4-6 Hours As Needed 22)  Tussionex Pennkinetic Er 8-10 Mg/56ml Lqcr (Chlorpheniramine-Hydrocodone) .Marland Kitchen.. 1 Tsp Every 12 Hr As Needed Cough, May Make You Sleepy  Allergies (verified): 1)  !  Avelox  Past History:  Past medical, surgical, family and social histories (including risk factors) reviewed, and no changes noted (except as noted below).  Past Medical History: Reviewed history from 07/05/2007 and no changes required. [name] (ICD-695.3) FATTY LIVER DISEASE VIA CT (ICD-571.8) GERD (ICD-530.81) DIABETES MELLITUS, TYPE II (ICD-250.00) POSITIVE PPD (ICD-795.5) INVOLUNTARY MOVEMENTS, ABNORMAL (ICD-781.0) HYPERCHOLESTEROLEMIA / TRIG  (235/374) (ICD-272.0) HYPOTHYROIDISM (ICD-244.9) BRONCHITIS, ACUTE (ICD-466.0) URINARY INCONTINENCE (ICD-788.30) SCLEROSIS, MULTIPLE (ICD-340) ADENOCARCINOMA, BREAST (ICD-174.9) VOCAL CORD DISORDER  (ICD-478.5) HYPERTENSION (ICD-401.9) DEPRESSION (ICD-311) CORONARY ARTERY DISEASE, MINIMAL (ICD-414.00) COPD (ICD-496) ASTHMA (ICD-493.90)  Past Surgical History: Reviewed history from 07/05/2007 and no changes required. Appy 1942 Tonsillectomy 1951 D&C 1964/5 NSVD x 5 BTL 1970 Head CT- chronic sinusitis 02/99 Enoscopy normal except hiatal hernia 1980's Barium swallow- wnl 1980's MRI Head- sm vess dz, , not type of M.S. 01/99 Sinus Surgery 11/1997 Bone density wnl 01/12/98 [facility] hospitalization 05/15-22/01 Head MRI ([name]) wnl 06/01 Adenosine Cardiolite wnl 07/01 Radiologic renal cyst aspiration , left 06/04/00 CT Pelvis, left renal cyst, right sm renal cyst, fatty liver 04/02 Colonoscopy, polyps 04/02 EGD, H.H. 04/02 CT Pelvis (via Charlann Boxer) left renal cyst 02/04 Holter wnl 07/14/99 Cath, wnl (Dr. Katrinka Blazing) 12/03/02 Colonoscopy 3mm polyp destroyed 10/22/03 Right breast lumpectomy, ductal cancer 01/06 MRI of bilat breast, wnl 03/07/06  Clinical Reports Reviewed:  CXR:  08/06/2008: CXR Results:  IMPRESSION: There is improved aeration of the lungs.  Left lung is now free of infiltrates.  There is atelectasis and probable with densities seen in the right upper midlung zone extending from the right upper hilar region.  The surrounding patchy infiltrative density in the right upper lobe seen on the previous study has improved.  The infiltrative densities within the right lower lung zone have also improved since previous study.  Slight residual increased markings are seen.     Family History: Reviewed history from 05/31/2007 and no changes required. Father: Died at age 2 of coronary disease Mother: Died at age 35 of cancer of the pancreas with diabetes and emphysema in a nonsmoker Siblings: 2 brothers, twins, both with high blood pressure and diabetes, Leukemia. 3 sisters, all deceased, one with nonalcoholic cirrhosis, one with cancer of the pancreas, one with lymphoma CV  - none HBP + 2 sisters, 2 brothers DM + sisters, 2 brothers Lymphoma + sister Thyroid cancer + daughter Cancer pancr + mother Liver and pancr cancer + sister Cirrhosis + sister  Social History: Reviewed history from 10/05/2008 and no changes required. Marital Status: Married Children: 5 Occupation: Housewife, former Engineer, building services.  L[facility] with her husband at [facility] : [facility] Daughter is [name], Charity fundraiser (former Engineer, civil (consulting) for [employer]) former smoker - quit in 1986, x26yrs, 1-1.5ppd  Review of Systems       The patient complains of shortness of breath with activity, productive cough, non-productive cough, change in color of mucus, and fever.  The patient denies shortness of breath at rest, coughing up blood, chest pain, irregular heartbeats, acid heartburn, indigestion, loss of appetite, weight change, abdominal pain, difficulty swallowing, sore throat, tooth/dental problems, headaches, nasal congestion/difficulty breathing through nose, sneezing, itching, ear ache, anxiety, depression, hand/feet swelling, joint stiffness or pain, and rash.    Vital Signs:  Patient profile:   77 year old female Height:      64 inches Weight:      244 pounds BMI:     42.03 O2 Sat:      96 % on Room air Temp:     98.3 degrees  F oral Pulse rate:   105 / minute BP sitting:   118 / 72  (left arm) Cuff size:   large  Vitals Entered By: Gweneth Dimitri RN (April 15, 2009 11:43 AM)  O2 Flow:  Room air CC: Pt c/o wheezing, S.O.B all the time, productive cough with small amouts of yellow mucus Comments Medications reviewed with patient Daytime contact number verified with patient. Gweneth Dimitri RN  April 15, 2009 11:46 AM    Physical Exam  General:  dyspneic and obese.   Eyes:  PERRLA/EOM intact; conjunctiva and sclera clear Ears:  TMs intact and clear with normal canals Nose:  clear nasal discharge.   Mouth:  no deformity or lesions Neck:  no masses, thyromegaly, or abnormal cervical nodes Chest Wall:  no  deformities noted Breasts:  no masses, adenopathy or nipple discharge.   Lungs:  bibasilar rales and wheezes bilateral.   prominent pseudowheeze  Heart:  regular rate and rhythm, S1, S2 without murmurs, rubs, gallops, or clicks Abdomen:  bowel sounds positive; abdomen soft and non-tender without masses, or organomegaly Msk:  no deformity or scoliosis noted with normal posture Pulses:  pulses normal Extremities:  no clubbing, cyanosis, edema, or deformity noted Neurologic:  decreased reflexes: and ataxic.   Skin:  intact without lesions or rashes Cervical Nodes:  no significant adenopathy Axillary Nodes:  no significant adenopathy Inguinal Nodes:  no significant adenopathy Psych:  anxious.     Impression & Recommendations:  Problem # 1:  PNEUMONIA (ICD-486) Assessment Deteriorated bilateral pneumonia likely due to aspiration with asthma flare plan admit reg room IV rocephin/zmax/flagyl avoid avelox neb med flutter valve  Orders: T-2 View CXR (71020TC) No Charge Patient Arrived (NCPA0) (NCPA0)  Problem # 2:  ACUTE CYSTITIS (ICD-595.0) Assessment: Deteriorated acute cystitis  plan u/a urine c/s  Complete Medication List: 1)  Spiriva Handihaler 18 Mcg Caps (Tiotropium bromide monohydrate) .... Inhale contents of 1 capsule once a day 2)  Zetia 10 Mg Tabs (Ezetimibe) .Marland Kitchen.. 1 tablet at bedtime 3)  Aspirin 81 Mg Tbec (Aspirin) .... Take one by mouth once a day 4)  Arimidex 1 Mg Tabs (Anastrozole) .... Take 1 tablet by mouth once a day 5)  Protonix 40 Mg Tbec (Pantoprazole sodium) .... One by mouth two times a day 6)  Metformin Hcl 500 Mg Tb24 (Metformin hcl) .Marland Kitchen.. 1 at bedtime 7)  Cozaar 50 Mg Tabs (Losartan potassium) .... Take 1 tablet by mouth once a day 8)  Catapres 0.3 Mg Tabs (Clonidine hcl) .... One tab by mouth two times a day 9)  Effexor Xr 75 Mg Cp24 (Venlafaxine hcl) .... Take one by mouth once a day 10)  Calcium Carbonate-vitamin D 600-400 Mg-unit Tabs (Calcium  carbonate-vitamin d) .... Take 1 tablet by mouth two times a day 11)  Magnesium 250 Mg Tabs (Magnesium) .... Take 1 tablet by mouth once a day 12)  Multivitamins Tabs (Multiple vitamin) .... Take 1 tablet by mouth once a day 13)  Cyclobenzaprine Hcl 10 Mg Tabs (Cyclobenzaprine hcl) .... Take 1 tablet by mouth two times a day 14)  Tylenol Pm Extra Strength 500-25 Mg Tabs (Diphenhydramine-apap (sleep)) .... Take 1 tab by mouth at bedtime 15)  Synthroid 137 Mcg Tabs (Levothyroxine sodium) .... One by mouth once daily 16)  Miralax Powd (Polyethylene glycol 3350) .Marland Kitchen.. 1 packet daily as needed 17)  Lasix 20 Mg Tabs (Furosemide) .Marland Kitchen.. 1 by mouth once daily as needed edema 18)  Aleve 220 Mg Tabs (Naproxen sodium) .Marland KitchenMarland KitchenMarland Kitchen  Per bottle 19)  Vicodin 5-500 Mg Tabs (Hydrocodone-acetaminophen) .... Take 1 tablet by mouth every 4-6 hours as needed 20)  Tessalon 200 Mg Caps (Benzonatate) .Marland Kitchen.. 1 by mouth three times a day 21)  Albuterol Sulfate (2.5 Mg/63ml) 0.083% Nebu (Albuterol sulfate) .Marland Kitchen.. 1 vial every 4-6 hours as needed 22)  Tussionex Pennkinetic Er 8-10 Mg/80ml Lqcr (Chlorpheniramine-hydrocodone) .Marland Kitchen.. 1 tsp every 12 hr as needed cough, may make you sleepy  Patient Instructions: 1)  You will be admitted to [facility] for Dx of Pneumonia

## 2010-03-08 NOTE — Assessment & Plan Note (Signed)
Summary: Acute NP office visit - SOB, wheezing   Primary Provider/Referring Provider:  Joycelyn Man  CC:  increased SOB, wheezing, prod cough with yellow/white sputum, f/c/s, increased fatigue, and increased use of albuterol neb - worse since last OV.  History of Present Illness: 77  yo WF with hx of AB/COPD.   January 20, 2008 10:21 AM  Better since last OV 11/09 with NP Parrett.     August 06, 2008  Currently the pt feels no energy, has poor balance though dyspnea and cough are better.  Had RUL PNA but now less cough.  Still pain on R side with deep breath.  Pt stopped all inhalers now back on same.   August 25, 2008 --This week is better. Not using symbicort regularly.  No cough.  No chest pain.  Dyspnea much better. CT chest did not reveal any chronic abnormalities. No CA seen on CT scan.  PNA had cleared on CT.  September 08, 2008--Returns for follow up and med review. Doing much better and cough/dyspnea are much improved. Brought all her meds today. Wants to known if any she can stop. Discussed all meds. Symbicort was stopped last visit w/o flare.   October 05, 2008--Presents for an acute office visit. Complains of increased SOB, wheezing x3-4days,    October 15, 2008--At last ov pt saw NP, Levaquin 750 and steroid taper.  December 17, 2008  Cough ok,  has a tickle in the throat.  some mucous but not much.  still with pseudowheeze.No neuro changes.  Had sleep study done:  narcolepsy.  no sleep apnea seen achilles tendon is an issue and needs to be lengthened.  needs surgery with a block.  minimal conscious sedation as outpatient dr troxler at [facility].  February 03, 2009--Presents for an acute office viist. Complains of increased SOB, wheezing, prod cough with yellow mucus, some chills x2weeks - increased neb use x1week.  OTC not helping.    February 15, 2009--Presents for an acute office visit for persistent symptoms. Complains of increased SOB, wheezing, prod cough  with yellow/white sputum, f/c/s, increased fatigue, increased use of albuterol neb - worse since last OV. Last visit tx w/ zpack and steroid burst. Daughter with her today. Eating good. Cough is wearing her out. Wheezing is no better. Denies chest pain, orthopnea, hemoptysis, fever, n/v/d, edema, headache.    Medications Prior to Update: 1)  Spiriva Handihaler 18 Mcg Caps (Tiotropium Bromide Monohydrate) .... Inhale Contents of 1 Capsule Once A Day 2)  Zetia 10 Mg  Tabs (Ezetimibe) .Marland Kitchen.. 1 Tablet At Bedtime 3)  Aspirin 81 Mg  Tbec (Aspirin) .... Take One By Mouth Once A Day 4)  Arimidex 1 Mg  Tabs (Anastrozole) .... Take 1 Tablet By Mouth Once A Day 5)  Protonix 40 Mg  Tbec (Pantoprazole Sodium) .... One By Mouth Two Times A Day 6)  Metformin Hcl 500 Mg  Tb24 (Metformin Hcl) .Marland Kitchen.. 1 At Bedtime 7)  Cozaar 50 Mg  Tabs (Losartan Potassium) .... Take 1 Tablet By Mouth Once A Day 8)  Catapres 0.3 Mg Tabs (Clonidine Hcl) .... One Tab By Mouth Two Times A Day 9)  Effexor Xr 75 Mg  Cp24 (Venlafaxine Hcl) .... Take One By Mouth Once A Day 10)  Calcium Carbonate-Vitamin D 600-400 Mg-Unit  Tabs (Calcium Carbonate-Vitamin D) .... Take 1 Tablet By Mouth Two Times A Day 11)  Magnesium 250 Mg Tabs (Magnesium) .... Take 1 Tablet By Mouth Once A  Day 12)  Multivitamins   Tabs (Multiple Vitamin) .... Take 1 Tablet By Mouth Once A Day 13)  Cyclobenzaprine Hcl 10 Mg Tabs (Cyclobenzaprine Hcl) .... Take 1 Tablet By Mouth Two Times A Day 14)  Tylenol Pm Extra Strength 500-25 Mg Tabs (Diphenhydramine-Apap (Sleep)) .... Take 1 Tab By Mouth At Bedtime 15)  Synthroid 137 Mcg  Tabs (Levothyroxine Sodium) .... One By Mouth Once Daily 16)  Miralax   Powd (Polyethylene Glycol 3350) .Marland Kitchen.. 1 Packet Daily As Needed 17)  Lasix 20 Mg Tabs (Furosemide) .Marland Kitchen.. 1 By Mouth Once Daily As Needed Edema 18)  Aleve 220 Mg Tabs (Naproxen Sodium) .... Per Bottle 19)  Vicodin 5-500 Mg Tabs (Hydrocodone-Acetaminophen) .... Take 1 Tablet By Mouth  Every 4-6 Hours As Needed 20)  Mucinex Dm 30-600 Mg Xr12h-Tab (Dextromethorphan-Guaifenesin) .Marland Kitchen.. 1 Every 12 Hours As Needed 21)  Tessalon Perles 100 Mg  Caps (Benzonatate) .Marland Kitchen.. 1 By Mouth Three Times A Day As Needed Cough 22)  Albuterol Sulfate (2.5 Mg/70ml) 0.083% Nebu (Albuterol Sulfate) .Marland Kitchen.. 1 Vial Every 4-6 Hours As Needed 23)  Zithromax Z-Pak 250 Mg Tabs (Azithromycin) .... Take As Directed. 24)  Prednisone 10 Mg Tabs (Prednisone) .... 4 Tabs For 3  Days, Then 3 Tabs For 3 Days, 2 Tabs For 3 Days, Then 1 Tab For 3 Days, Then Stop  Current Medications (verified): 1)  Spiriva Handihaler 18 Mcg Caps (Tiotropium Bromide Monohydrate) .... Inhale Contents of 1 Capsule Once A Day 2)  Zetia 10 Mg  Tabs (Ezetimibe) .Marland Kitchen.. 1 Tablet At Bedtime 3)  Aspirin 81 Mg  Tbec (Aspirin) .... Take One By Mouth Once A Day 4)  Arimidex 1 Mg  Tabs (Anastrozole) .... Take 1 Tablet By Mouth Once A Day 5)  Protonix 40 Mg  Tbec (Pantoprazole Sodium) .... One By Mouth Two Times A Day 6)  Metformin Hcl 500 Mg  Tb24 (Metformin Hcl) .Marland Kitchen.. 1 At Bedtime 7)  Cozaar 50 Mg  Tabs (Losartan Potassium) .... Take 1 Tablet By Mouth Once A Day 8)  Catapres 0.3 Mg Tabs (Clonidine Hcl) .... One Tab By Mouth Two Times A Day 9)  Effexor Xr 75 Mg  Cp24 (Venlafaxine Hcl) .... Take One By Mouth Once A Day 10)  Calcium Carbonate-Vitamin D 600-400 Mg-Unit  Tabs (Calcium Carbonate-Vitamin D) .... Take 1 Tablet By Mouth Two Times A Day 11)  Magnesium 250 Mg Tabs (Magnesium) .... Take 1 Tablet By Mouth Once A Day 12)  Multivitamins   Tabs (Multiple Vitamin) .... Take 1 Tablet By Mouth Once A Day 13)  Cyclobenzaprine Hcl 10 Mg Tabs (Cyclobenzaprine Hcl) .... Take 1 Tablet By Mouth Two Times A Day 14)  Tylenol Pm Extra Strength 500-25 Mg Tabs (Diphenhydramine-Apap (Sleep)) .... Take 1 Tab By Mouth At Bedtime 15)  Synthroid 137 Mcg  Tabs (Levothyroxine Sodium) .... One By Mouth Once Daily 16)  Miralax   Powd (Polyethylene Glycol 3350) .Marland Kitchen.. 1 Packet  Daily As Needed 17)  Lasix 20 Mg Tabs (Furosemide) .Marland Kitchen.. 1 By Mouth Once Daily As Needed Edema 18)  Aleve 220 Mg Tabs (Naproxen Sodium) .... Per Bottle 19)  Vicodin 5-500 Mg Tabs (Hydrocodone-Acetaminophen) .... Take 1 Tablet By Mouth Every 4-6 Hours As Needed 20)  Mucinex Dm 30-600 Mg Xr12h-Tab (Dextromethorphan-Guaifenesin) .Marland Kitchen.. 1 Every 12 Hours As Needed 21)  Tessalon Perles 100 Mg  Caps (Benzonatate) .Marland Kitchen.. 1 By Mouth Three Times A Day As Needed Cough 22)  Albuterol Sulfate (2.5 Mg/56ml) 0.083% Nebu (Albuterol Sulfate) .Marland Kitchen.. 1 Vial  Every 4-6 Hours As Needed  Allergies (verified): 1)  ! Avelox  Past History:  Past Medical History: Last updated: 07/05/2007 [name] (ICD-695.3) FATTY LIVER DISEASE VIA CT (ICD-571.8) GERD (ICD-530.81) DIABETES MELLITUS, TYPE II (ICD-250.00) POSITIVE PPD (ICD-795.5) INVOLUNTARY MOVEMENTS, ABNORMAL (ICD-781.0) HYPERCHOLESTEROLEMIA / TRIG  (235/374) (ICD-272.0) HYPOTHYROIDISM (ICD-244.9) BRONCHITIS, ACUTE (ICD-466.0) URINARY INCONTINENCE (ICD-788.30) SCLEROSIS, MULTIPLE (ICD-340) ADENOCARCINOMA, BREAST (ICD-174.9) VOCAL CORD DISORDER (ICD-478.5) HYPERTENSION (ICD-401.9) DEPRESSION (ICD-311) CORONARY ARTERY DISEASE, MINIMAL (ICD-414.00) COPD (ICD-496) ASTHMA (ICD-493.90)  Past Surgical History: Last updated: 07/05/2007 Appy 1942 Tonsillectomy 1951 D&C 1964/5 NSVD x 5 BTL 1970 Head CT- chronic sinusitis 02/99 Enoscopy normal except hiatal hernia 1980's Barium swallow- wnl 1980's MRI Head- sm vess dz, , not type of M.S. 01/99 Sinus Surgery 11/1997 Bone density wnl 01/12/98 [facility] hospitalization 05/15-22/01 Head MRI ([name]) wnl 06/01 Adenosine Cardiolite wnl 07/01 Radiologic renal cyst aspiration , left 06/04/00 CT Pelvis, left renal cyst, right sm renal cyst, fatty liver 04/02 Colonoscopy, polyps 04/02 EGD, H.H. 04/02 CT Pelvis (via Charlann Boxer) left renal cyst 02/04 Holter wnl 07/14/99 Cath, wnl (Dr. Katrinka Blazing) 12/03/02 Colonoscopy 3mm polyp  destroyed 10/22/03 Right breast lumpectomy, ductal cancer 01/06 MRI of bilat breast, wnl 03/07/06  Family History: Last updated: 2007/06/16 Father: Died at age 66 of coronary disease Mother: Died at age 3 of cancer of the pancreas with diabetes and emphysema in a nonsmoker Siblings: 2 brothers, twins, both with high blood pressure and diabetes, Leukemia. 3 sisters, all deceased, one with nonalcoholic cirrhosis, one with cancer of the pancreas, one with lymphoma CV - none HBP + 2 sisters, 2 brothers DM + sisters, 2 brothers Lymphoma + sister Thyroid cancer + daughter Cancer pancr + mother Liver and pancr cancer + sister Cirrhosis + sister  Social History: Last updated: 10/05/2008 Marital Status: Married Children: 5 Occupation: Orthoptist, former Engineer, building services.  Lives with her husband at [facility] Daughter is [name], Charity fundraiser (former Engineer, civil (consulting) for [employer]) former smoker - quit in 1986, x65yrs, 1-1.5ppd  Risk Factors: Alcohol Use: 2 (06/16/2007) Caffeine Use: 1 (Jun 16, 2007) Exercise: no (06-16-07)  Risk Factors: Smoking Status: quit > 6 months (12/17/2008) Passive Smoke Exposure: no (06/16/07)  Review of Systems      See HPI  Vital Signs:  Patient profile:   77 year old female Height:      64 inches Weight:      247.44 pounds BMI:     42.63 O2 Sat:      97 % on Room air Temp:     97.2 degrees F oral Pulse rate:   89 / minute BP sitting:   122 / 76  (left arm) Cuff size:   large  Vitals Entered By: Boone Master CNA (February 15, 2009 2:02 PM)  O2 Flow:  Room air CC: increased SOB, wheezing, prod cough with yellow/white sputum, f/c/s, increased fatigue, increased use of albuterol neb - worse since last OV Is Patient Diabetic? Yes Comments Medications reviewed with patient Daytime contact number verified with patient. Boone Master CNA  February 15, 2009 2:01 PM    Physical Exam  Additional Exam:  GEN: A/Ox3; pleasant , NAD HEENT:  [place]/AT, , EACs-clear, TMs-wnl,  NOSE-clear, THROAT-clear NECK:  Supple w/ fair ROM; no JVD; normal carotid impulses w/o bruits; no thyromegaly or nodules palpated; no lymphadenopathy. CHEST: Coarse BS w/ exp wheezing. she has loud psuedowheezing.  HEART:  RRR, no m/r/g ABDOMEN:  Soft & nt; nml bowel sounds; no organomegaly or masses detected, no guarding, or rebound, sl distended EXT:  Warm bil,  no calf pain, tr-edema- improved, clubbing, pulses intact     Impression & Recommendations:  Problem # 1:  ASTHMA (ICD-493.90) Slow to resolve exacerbation We discussed hospitalization , however she declined saying she is not that bad.  She is able to take in by mouth meds. pt /daughter are aware if not improving or worsens to call back for ov or go to ER.  REC: Levaquin 750mg  once daily for 7 days. w/ food  Prednsione taper as directed.  Mcuinex DM two times a day  Tessalon 200mg  three times a day  Tussionex 1 tsp two times a day as needed severe cough.  Brush. rinse and gargle after inhaler use.  Add pepcid 20mg  at bedtime  Add zyrtec 10mg  at bedtime  Extra Lasix 20mg  once daily for 2 days.  Sugarless candy, NO MINTS, to help avoid throat clearing/coughing.  follow up 1 week Dr. Delford Field or Parrett  Please contact office for sooner follow up if symptoms do not improve or worsen  The following medications were removed from the medication list:    Zithromax Z-pak 250 Mg Tabs (Azithromycin) .Marland Kitchen... Take as directed. Her updated medication list for this problem includes:    Levaquin 750 Mg Tabs (Levofloxacin) .Marland Kitchen... 1 by mouth once daily  Orders: Est. Patient Level IV (33295)  Medications Added to Medication List This Visit: 1)  Tessalon 200 Mg Caps (Benzonatate) .Marland Kitchen.. 1 by mouth three times a day as needed cough 2)  Levaquin 750 Mg Tabs (Levofloxacin) .Marland Kitchen.. 1 by mouth once daily 3)  Prednisone 10 Mg Tabs (Prednisone) .... 4 tabs for 4 days, then 3 tabs for 4 days, 2 tabs for 4 days, then 1 tab for 4 days, then stop 4)   Tussionex Pennkinetic Er 8-10 Mg/28ml Lqcr (Chlorpheniramine-hydrocodone) .Marland Kitchen.. 1 tsp every 12 hr as needed cough, may make you sleepy  Complete Medication List: 1)  Spiriva Handihaler 18 Mcg Caps (Tiotropium bromide monohydrate) .... Inhale contents of 1 capsule once a day 2)  Zetia 10 Mg Tabs (Ezetimibe) .Marland Kitchen.. 1 tablet at bedtime 3)  Aspirin 81 Mg Tbec (Aspirin) .... Take one by mouth once a day 4)  Arimidex 1 Mg Tabs (Anastrozole) .... Take 1 tablet by mouth once a day 5)  Protonix 40 Mg Tbec (Pantoprazole sodium) .... One by mouth two times a day 6)  Metformin Hcl 500 Mg Tb24 (Metformin hcl) .Marland Kitchen.. 1 at bedtime 7)  Cozaar 50 Mg Tabs (Losartan potassium) .... Take 1 tablet by mouth once a day 8)  Catapres 0.3 Mg Tabs (Clonidine hcl) .... One tab by mouth two times a day 9)  Effexor Xr 75 Mg Cp24 (Venlafaxine hcl) .... Take one by mouth once a day 10)  Calcium Carbonate-vitamin D 600-400 Mg-unit Tabs (Calcium carbonate-vitamin d) .... Take 1 tablet by mouth two times a day 11)  Magnesium 250 Mg Tabs (Magnesium) .... Take 1 tablet by mouth once a day 12)  Multivitamins Tabs (Multiple vitamin) .... Take 1 tablet by mouth once a day 13)  Cyclobenzaprine Hcl 10 Mg Tabs (Cyclobenzaprine hcl) .... Take 1 tablet by mouth two times a day 14)  Tylenol Pm Extra Strength 500-25 Mg Tabs (Diphenhydramine-apap (sleep)) .... Take 1 tab by mouth at bedtime 15)  Synthroid 137 Mcg Tabs (Levothyroxine sodium) .... One by mouth once daily 16)  Miralax Powd (Polyethylene glycol 3350) .Marland Kitchen.. 1 packet daily as needed 17)  Lasix 20 Mg Tabs (Furosemide) .Marland Kitchen.. 1 by mouth once daily as needed edema  18)  Aleve 220 Mg Tabs (Naproxen sodium) .... Per bottle 19)  Vicodin 5-500 Mg Tabs (Hydrocodone-acetaminophen) .... Take 1 tablet by mouth every 4-6 hours as needed 20)  Mucinex Dm 30-600 Mg Xr12h-tab (Dextromethorphan-guaifenesin) .Marland Kitchen.. 1 every 12 hours as needed 21)  Tessalon 200 Mg Caps (Benzonatate) .Marland Kitchen.. 1 by mouth three times  a day as needed cough 22)  Albuterol Sulfate (2.5 Mg/11ml) 0.083% Nebu (Albuterol sulfate) .Marland Kitchen.. 1 vial every 4-6 hours as needed 23)  Levaquin 750 Mg Tabs (Levofloxacin) .Marland Kitchen.. 1 by mouth once daily 24)  Prednisone 10 Mg Tabs (Prednisone) .... 4 tabs for 4 days, then 3 tabs for 4 days, 2 tabs for 4 days, then 1 tab for 4 days, then stop 25)  Tussionex Pennkinetic Er 8-10 Mg/20ml Lqcr (Chlorpheniramine-hydrocodone) .Marland Kitchen.. 1 tsp every 12 hr as needed cough, may make you sleepy  Other Orders: T-2 View CXR ([id number])  Patient Instructions: 1)  Levaquin 750mg  once daily for 7 days. w/ food  2)  Prednsione taper as directed.  3)  Mcuinex DM two times a day  4)  Tessalon 200mg  three times a day  5)  Tussionex 1 tsp two times a day as needed severe cough.  6)  Brush. rinse and gargle after inhaler use.  7)  Add pepcid 20mg  at bedtime  8)  Add zyrtec 10mg  at bedtime  9)  Extra Lasix 20mg  once daily for 2 days.  10)  Sugarless candy, NO MINTS, to help avoid throat clearing/coughing.  11)  follow up 1 week Dr. Delford Field or Parrett  12)  Please contact office for sooner follow up if symptoms do not improve or worsen  Prescriptions: TUSSIONEX PENNKINETIC ER 8-10 MG/5ML LQCR (CHLORPHENIRAMINE-HYDROCODONE) 1 tsp every 12 hr as needed cough, may make you sleepy  #4 oz x 0   Entered and Authorized by:   Rubye Oaks NP   Signed by:   Tammy Parrett NP on 02/15/2009   Method used:   Print then Give to Patient   RxID:   [id number] TESSALON 200 MG CAPS (BENZONATATE) 1 by mouth three times a day as needed cough  #30 x 1   Entered and Authorized by:   Rubye Oaks NP   Signed by:   Rubye Oaks NP on 02/15/2009   Method used:   Electronically to        [facility] [address] (retail)       [address] [place], [place]  [address]       Ph: [phone]       Fax: [phone]   RxID:   [id number] PREDNISONE 10 MG TABS (PREDNISONE) 4 tabs for 4 days, then 3  tabs for 4 days, 2 tabs for 4 days, then 1 tab for 4 days, then stop  #40 x 0   Entered and Authorized by:   Rubye Oaks NP   Signed by:   Rubye Oaks NP on 02/15/2009   Method used:   Electronically to        [facility] [address] (retail)       [address] [place], [place]  [address]       Ph: [phone]       Fax: [phone]   RxID:   [id number] LEVAQUIN 750 MG TABS (LEVOFLOXACIN) 1 by mouth once  daily  #7 x 0   Entered and Authorized by:   Rubye Oaks NP   Signed by:   Rubye Oaks NP on 02/15/2009   Method used:   Electronically to        [facility] [address] (retail)       [address] [place], [place]  [address]       Ph: [phone]       Fax: [phone]   RxID:   [id number]

## 2010-03-08 NOTE — Progress Notes (Signed)
Summary: Sputum result normal  Phone Note Outgoing Call   Reason for Call: Discuss lab or test results Summary of Call: call pt and tell her sputum culture normal  no change in therapy Initial call taken by: Storm Frisk MD,  September 26, 2009 9:40 AM  Follow-up for Phone Call        Called, spoke with pt.  Pt informed of above results and recs per PW.  She verbalized understanding of instructions.  Follow-up by: Gweneth Dimitri RN,  September 27, 2009 9:10 AM

## 2010-03-08 NOTE — Letter (Signed)
Summary: [facility]   Imported By: Lanelle Bal 12/16/2009 09:13:52  _____________________________________________________________________  External Attachment:    Type:   Image     Comment:   External Document

## 2010-03-08 NOTE — Progress Notes (Signed)
Summary: Rx Clonidine, Quantity change  Phone Note Refill Request Call back at [phone] Message from:  Karin Golden on June 01, 2009 3:11 PM  Refills Requested: Medication #1:  CATAPRES 0.3 MG TABS one tab by mouth two times a day   Last Refilled: 05/28/2009 Pharmacy faxed a refill request asking that quantity be changed from 30 pills to 60 pills.  Patient is taking twice daily.  Says that patient has to get a refill every 15 days instead of every 30 days with the way it's written now.  Please advise.   Method Requested: Electronic Initial call taken by: Linde Gillis CMA Duncan Dull),  June 01, 2009 3:13 PM    Prescriptions: CATAPRES 0.3 MG TABS (CLONIDINE HCL) one tab by mouth two times a day  #60 x 12   Entered and Authorized by:   Shaune Leeks MD   Signed by:   Shaune Leeks MD on 06/01/2009   Method used:   Electronically to        [facility] [address] (retail)       [address] [place], [place]  [address]       Ph: [phone]       Fax: [phone]   RxID:   [id number]  Should have been 60 in the first place. My apologies to the pt. Shaune Leeks MD  June 01, 2009 4:10 PM

## 2010-03-08 NOTE — Letter (Signed)
[facility]   Imported By: Lanelle Bal 06/03/2009 13:56:47  _____________________________________________________________________  External Attachment:    Type:   Image     Comment:   External Document

## 2010-03-08 NOTE — Letter (Signed)
Summary: [facility]  [facility]   Imported By: Sherian Rein 05/06/2009 13:14:03  _____________________________________________________________________  External Attachment:    Type:   Image     Comment:   External Document

## 2010-03-08 NOTE — Letter (Signed)
Summary: Dr.Joshua Landau,Orthopedic,Note  Dr.Joshua Landau,Orthopedic,Note   Imported By: Beau Fanny 06/22/2009 10:51:14  _____________________________________________________________________  External Attachment:    Type:   Image     Comment:   External Document

## 2010-03-08 NOTE — Assessment & Plan Note (Signed)
Summary: Pulmonary OV   Primary Provider/Referring Provider:  Joycelyn Man  CC:  2 wk follow up.  states breathing is a lot better but still having some SOB with activity.  .  History of Present Illness: 77  yo WF with hx of AB/COPD, VCD , and MS  January 20, 2008 10:21 AM  Better since last OV 11/09 with NP Parrett.     August 06, 2008  Currently the pt feels no energy, has poor balance though dyspnea and cough are better.  Had RUL PNA but now less cough.  Still pain on R side with deep breath.  Pt stopped all inhalers now back on same.   August 25, 2008 --This week is better. Not using symbicort regularly.  No cough.  No chest pain.  Dyspnea much better. CT chest did not reveal any chronic abnormalities. No CA seen on CT scan.  PNA had cleared on CT.  September 08, 2008--Returns for follow up and med review. Doing much better and cough/dyspnea are much improved. Brought all her meds today. Wants to known if any she can stop. Discussed all meds. Symbicort was stopped last visit w/o flare.   October 05, 2008--Presents for an acute office visit. Complains of increased SOB, wheezing x3-4days,    October 15, 2008--At last ov pt saw NP, Levaquin 750 and steroid taper.  December 17, 2008  Cough ok,  has a tickle in the throat.  some mucous but not much.  still with pseudowheeze.No neuro changes.  Had sleep study done:  narcolepsy.  no sleep apnea seen achilles tendon is an issue and needs to be lengthened.  needs surgery with a block.  minimal conscious sedation as outpatient dr troxler at [facility].  February 03, 2009--Presents for an acute office viist. Complains of increased SOB, wheezing, prod cough with yellow mucus, some chills x2weeks - increased neb use x1week.  OTC not helping.    February 15, 2009--Presents for an acute office visit for persistent symptoms. Complains of increased SOB, wheezing, prod cough with yellow/white sputum, f/c/s, increased fatigue, increased use  of albuterol neb - worse since last OV. Last visit tx w/ zpack and steroid burst. Daughter with her today. Eating good. Cough is wearing her out. Wheezing is no better.  February 22, 2009 --Returns for follow up . Last visit with [facility] with flare of VCD. Pt treated wtih Levaquin x 7 d, steroids, and aggressive cough, GERD  and rhinits prevention. Unfortunately she only took abx and steroids , did not add pepcid, zrtec and not using muciinex or tessalon.  She is feeliing some better with less wheeizng and cough. She continues to have loud upper airway psuedowheezing. Denies chest pain, orthopnea, hemoptysis, fever, n/v/d, edema, headache. Her daughter-Kim is with her today, we discussed importance of pt taking meds as directed. CXR last visit showed chronic changes with resolved infiltates    March 04, 2009 11:23 AM Now is better.  No cough now.  The dyspnea is better, worse with exertion,  No real pndrip.  Notes some sinus issues.   Overall is improved from prior visits.  Preventive Screening-Counseling & Management  Alcohol-Tobacco     Smoking Status: quit > 6 months  Current Medications (verified): 1)  Spiriva Handihaler 18 Mcg Caps (Tiotropium Bromide Monohydrate) .... Inhale Contents of 1 Capsule Once A Day 2)  Zetia 10 Mg  Tabs (Ezetimibe) .Marland Kitchen.. 1 Tablet At Bedtime 3)  Aspirin 81 Mg  Tbec (Aspirin) .Marland KitchenMarland KitchenMarland Kitchen  Take One By Mouth Once A Day 4)  Arimidex 1 Mg  Tabs (Anastrozole) .... Take 1 Tablet By Mouth Once A Day 5)  Protonix 40 Mg  Tbec (Pantoprazole Sodium) .... One By Mouth Two Times A Day 6)  Metformin Hcl 500 Mg  Tb24 (Metformin Hcl) .Marland Kitchen.. 1 At Bedtime 7)  Cozaar 50 Mg  Tabs (Losartan Potassium) .... Take 1 Tablet By Mouth Once A Day 8)  Catapres 0.3 Mg Tabs (Clonidine Hcl) .... One Tab By Mouth Two Times A Day 9)  Effexor Xr 75 Mg  Cp24 (Venlafaxine Hcl) .... Take One By Mouth Once A Day 10)  Calcium Carbonate-Vitamin D 600-400 Mg-Unit  Tabs (Calcium Carbonate-Vitamin D) .... Take 1 Tablet  By Mouth Two Times A Day 11)  Magnesium 250 Mg Tabs (Magnesium) .... Take 1 Tablet By Mouth Once A Day 12)  Multivitamins   Tabs (Multiple Vitamin) .... Take 1 Tablet By Mouth Once A Day 13)  Cyclobenzaprine Hcl 10 Mg Tabs (Cyclobenzaprine Hcl) .... Take 1 Tablet By Mouth Two Times A Day 14)  Tylenol Pm Extra Strength 500-25 Mg Tabs (Diphenhydramine-Apap (Sleep)) .... Take 1 Tab By Mouth At Bedtime 15)  Synthroid 137 Mcg  Tabs (Levothyroxine Sodium) .... One By Mouth Once Daily 16)  Miralax   Powd (Polyethylene Glycol 3350) .Marland Kitchen.. 1 Packet Daily As Needed 17)  Lasix 20 Mg Tabs (Furosemide) .Marland Kitchen.. 1 By Mouth Once Daily As Needed Edema 18)  Aleve 220 Mg Tabs (Naproxen Sodium) .... Per Bottle 19)  Vicodin 5-500 Mg Tabs (Hydrocodone-Acetaminophen) .... Take 1 Tablet By Mouth Every 4-6 Hours As Needed 20)  Tessalon 200 Mg Caps (Benzonatate) .Marland Kitchen.. 1 By Mouth Three Times A Day 21)  Albuterol Sulfate (2.5 Mg/22ml) 0.083% Nebu (Albuterol Sulfate) .Marland Kitchen.. 1 Vial Every 4-6 Hours As Needed 22)  Tussionex Pennkinetic Er 8-10 Mg/37ml Lqcr (Chlorpheniramine-Hydrocodone) .Marland Kitchen.. 1 Tsp Every 12 Hr As Needed Cough, May Make You Sleepy  Allergies (verified): 1)  ! Avelox  Past History:  Past medical, surgical, family and social histories (including risk factors) reviewed, and no changes noted (except as noted below).  Past Medical History: Reviewed history from 07/05/2007 and no changes required. [name] (ICD-695.3) FATTY LIVER DISEASE VIA CT (ICD-571.8) GERD (ICD-530.81) DIABETES MELLITUS, TYPE II (ICD-250.00) POSITIVE PPD (ICD-795.5) INVOLUNTARY MOVEMENTS, ABNORMAL (ICD-781.0) HYPERCHOLESTEROLEMIA / TRIG  (235/374) (ICD-272.0) HYPOTHYROIDISM (ICD-244.9) BRONCHITIS, ACUTE (ICD-466.0) URINARY INCONTINENCE (ICD-788.30) SCLEROSIS, MULTIPLE (ICD-340) ADENOCARCINOMA, BREAST (ICD-174.9) VOCAL CORD DISORDER (ICD-478.5) HYPERTENSION (ICD-401.9) DEPRESSION (ICD-311) CORONARY ARTERY DISEASE, MINIMAL (ICD-414.00) COPD  (ICD-496) ASTHMA (ICD-493.90)  Past Surgical History: Reviewed history from 07/05/2007 and no changes required. Appy 1942 Tonsillectomy 1951 D&C 1964/5 NSVD x 5 BTL 1970 Head CT- chronic sinusitis 02/99 Enoscopy normal except hiatal hernia 1980's Barium swallow- wnl 1980's MRI Head- sm vess dz, , not type of M.S. 01/99 Sinus Surgery 11/1997 Bone density wnl 01/12/98 [facility] hospitalization 05/15-22/01 Head MRI ([name]) wnl 06/01 Adenosine Cardiolite wnl 07/01 Radiologic renal cyst aspiration , left 06/04/00 CT Pelvis, left renal cyst, right sm renal cyst, fatty liver 04/02 Colonoscopy, polyps 04/02 EGD, H.H. 04/02 CT Pelvis (via Charlann Boxer) left renal cyst 02/04 Holter wnl 07/14/99 Cath, wnl (Dr. Katrinka Blazing) 12/03/02 Colonoscopy 3mm polyp destroyed 10/22/03 Right breast lumpectomy, ductal cancer 01/06 MRI of bilat breast, wnl 03/07/06  Family History: Reviewed history from 05/31/2007 and no changes required. Father: Died at age 69 of coronary disease Mother: Died at age 68 of cancer of the pancreas with diabetes and emphysema in a nonsmoker Siblings: 2 brothers,  twins, both with high blood pressure and diabetes, Leukemia. 3 sisters, all deceased, one with nonalcoholic cirrhosis, one with cancer of the pancreas, one with lymphoma CV - none HBP + 2 sisters, 2 brothers DM + sisters, 2 brothers Lymphoma + sister Thyroid cancer + daughter Cancer pancr + mother Liver and pancr cancer + sister Cirrhosis + sister  Social History: Reviewed history from 10/05/2008 and no changes required. Marital Status: Married Children: 5 Occupation: Housewife, former Engineer, building services.  Lives with her husband at [facility] Daughter is [name], Charity fundraiser (former Engineer, civil (consulting) for [employer]) former smoker - quit in 1986, x47yrs, 1-1.5ppd  Review of Systems       The patient complains of shortness of breath with activity.  The patient denies shortness of breath at rest, productive cough, non-productive cough, coughing  up blood, chest pain, irregular heartbeats, acid heartburn, indigestion, loss of appetite, weight change, abdominal pain, difficulty swallowing, sore throat, tooth/dental problems, headaches, nasal congestion/difficulty breathing through nose, sneezing, itching, ear ache, anxiety, depression, hand/feet swelling, joint stiffness or pain, rash, change in color of mucus, and fever.    Vital Signs:  Patient profile:   77 year old female Height:      64 inches Weight:      242 pounds BMI:     41.69 O2 Sat:      96 % on Room air Temp:     97.4 degrees F oral Pulse rate:   102 / minute BP sitting:   110 / 74  (left arm) Cuff size:   large  Vitals Entered By: Gweneth Dimitri RN (March 04, 2009 11:08 AM)  O2 Flow:  Room air CC: 2 wk follow up.  states breathing is a lot better but still having some SOB with activity.   Comments Medications reviewed with patient Daytime contact number verified with patient. Crystal Jones RN  March 04, 2009 11:10 AM    Physical Exam  Additional Exam:  GEN: A/Ox3; pleasant , NAD HEENT:  [place]/AT, , EACs-clear, TMs-wnl, NOSE-clear, THROAT-clear NECK:  Supple w/ fair ROM; no JVD; normal carotid impulses w/o bruits; no thyromegaly or nodules palpated; no lymphadenopathy. CHEST: Coarse BS w/ exp wheezing. she has loud psuedowheezing.  HEART:  RRR, no m/r/g ABDOMEN:  Soft & nt; nml bowel sounds; no organomegaly or masses detected, no guarding, or rebound, sl distended EXT: Warm bil,  no calf pain, tr-edema- improved, clubbing, pulses intact     Impression & Recommendations:  Problem # 1:  COPD (ICD-496) Assessment Unchanged  Improved copd/AB plan cont inhaled medications as prescribed  Problem # 2:  GERD (ICD-530.81) Assessment: Improved stay on ppi stop pepcid and zyrtec in one week Her updated medication list for this problem includes:    Protonix 40 Mg Tbec (Pantoprazole sodium) ..... One by mouth two times a day  Medications Added to Medication  List This Visit: 1)  Tessalon 200 Mg Caps (Benzonatate) .Marland Kitchen.. 1 by mouth three times a day  Complete Medication List: 1)  Spiriva Handihaler 18 Mcg Caps (Tiotropium bromide monohydrate) .... Inhale contents of 1 capsule once a day 2)  Zetia 10 Mg Tabs (Ezetimibe) .Marland Kitchen.. 1 tablet at bedtime 3)  Aspirin 81 Mg Tbec (Aspirin) .... Take one by mouth once a day 4)  Arimidex 1 Mg Tabs (Anastrozole) .... Take 1 tablet by mouth once a day 5)  Protonix 40 Mg Tbec (Pantoprazole sodium) .... One by mouth two times a day 6)  Metformin Hcl 500 Mg Tb24 (Metformin hcl) .Marland KitchenMarland KitchenMarland Kitchen  1 at bedtime 7)  Cozaar 50 Mg Tabs (Losartan potassium) .... Take 1 tablet by mouth once a day 8)  Catapres 0.3 Mg Tabs (Clonidine hcl) .... One tab by mouth two times a day 9)  Effexor Xr 75 Mg Cp24 (Venlafaxine hcl) .... Take one by mouth once a day 10)  Calcium Carbonate-vitamin D 600-400 Mg-unit Tabs (Calcium carbonate-vitamin d) .... Take 1 tablet by mouth two times a day 11)  Magnesium 250 Mg Tabs (Magnesium) .... Take 1 tablet by mouth once a day 12)  Multivitamins Tabs (Multiple vitamin) .... Take 1 tablet by mouth once a day 13)  Cyclobenzaprine Hcl 10 Mg Tabs (Cyclobenzaprine hcl) .... Take 1 tablet by mouth two times a day 14)  Tylenol Pm Extra Strength 500-25 Mg Tabs (Diphenhydramine-apap (sleep)) .... Take 1 tab by mouth at bedtime 15)  Synthroid 137 Mcg Tabs (Levothyroxine sodium) .... One by mouth once daily 16)  Miralax Powd (Polyethylene glycol 3350) .Marland Kitchen.. 1 packet daily as needed 17)  Lasix 20 Mg Tabs (Furosemide) .Marland Kitchen.. 1 by mouth once daily as needed edema 18)  Aleve 220 Mg Tabs (Naproxen sodium) .... Per bottle 19)  Vicodin 5-500 Mg Tabs (Hydrocodone-acetaminophen) .... Take 1 tablet by mouth every 4-6 hours as needed 20)  Tessalon 200 Mg Caps (Benzonatate) .Marland Kitchen.. 1 by mouth three times a day 21)  Albuterol Sulfate (2.5 Mg/67ml) 0.083% Nebu (Albuterol sulfate) .Marland Kitchen.. 1 vial every 4-6 hours as needed 22)  Tussionex Pennkinetic  Er 8-10 Mg/49ml Lqcr (Chlorpheniramine-hydrocodone) .Marland Kitchen.. 1 tsp every 12 hr as needed cough, may make you sleepy  Other Orders: Est. Patient Level III (60630)  Patient Instructions: 1)  You may stop pepcid and zyrtec after one week 2)  No other medication changes 3)  Return 6 weeks [place]

## 2010-03-08 NOTE — Progress Notes (Signed)
Summary: wants call to [facility]  Phone Note Call from Patient   Caller: Ana Nunez from [facility]  Complaint: Breathing Problems Summary of Call: Ana Nunez is asking for you to call her at [facility]. [phone] Initial call taken by: Melody Comas,  Jun 25, 2009 1:35 PM  Follow-up for Phone Call        Ana Nunez does not work weekends. Nurse on duty not aware of problems other than swelling and pain in the left foot (Nonoperative foot) today but had pain meds available. Will try to call Ana Nunez tomm. Shaune Leeks MD  Jun 27, 2009 2:34 PM  Sp[oke with coordinator and neither Ana Nunez, asst coordinator and therapy tech, claim to have called. Follow-up by: Shaune Leeks MD,  Jun 28, 2009 10:37 AM

## 2010-03-08 NOTE — Assessment & Plan Note (Signed)
Summary: follow up visit, med refill/NT   Vital Signs:  Patient profile:   77 year old female Weight:      242 pounds O2 Sat:      99 % on Room air Temp:     98.0 degrees F oral Pulse rate:   96 / minute Pulse rhythm:   regular BP sitting:   120 / 70  (left arm) Cuff size:   large  Vitals Entered By: Sydell Axon LPN (March 02, 2009 9:59 AM)  O2 Flow:  Room air CC: Follow-up, will need refills on medications in the future   History of Present Illness: Pt here for followup. She does not feel she would be able to go on their [place] in April so  has cancelled due to illness. Her cough is about gone. She still has SOB with exertion. She is reasonably stable today but has had a tough time in the laast few weeks to months. Today she feels well but gets SOB with minimal movement or exertion. She has no other acute difficulties or complaints.  Problems Prior to Update: 1)  Pneumonia  (ICD-486) 2)  Malaise and Fatigue  (ICD-780.79) 3)  Osteoarthritis, L Knee, Severe w/ Trauma  (ICD-715.96) 4)  Constipation  (ICD-564.00) 5)  Dysphagia  (ICD-787.29) 6)  Hiatal Hernia  (ICD-553.3) 7)  Gastritis, Chronic  (ICD-535.10) 8)  Colonic Polyps  (ICD-211.3) 9)  Dysphagia Oropharyngeal Phase  (ICD-787.22) 10)  Neck Pain  (ICD-723.1) 11)  [name]  (ICD-695.3) 12)  Fatty Liver Disease Via Ct  (ICD-571.8) 13)  Gerd  (ICD-530.81) 14)  Diabetes Mellitus, Type II  (ICD-250.00) 15)  Positive Ppd  (ICD-795.5) 16)  Involuntary Movements, Abnormal  (ICD-781.0) 17)  Hypercholesterolemia / Trig (235/374)  (ICD-272.0) 18)  Hypothyroidism  (ICD-244.9) 19)  Bronchitis, Acute  (ICD-466.0) 20)  Urinary Incontinence  (ICD-788.30) 21)  Sclerosis, Multiple  (ICD-340) 22)  Adenocarcinoma, Breast  (ICD-174.9) 23)  Vocal Cord Disorder  (ICD-478.5) 24)  Hypertension  (ICD-401.9) 25)  Depression  (ICD-311) 26)  Coronary Artery Disease, Minimal  (ICD-414.00) 27)  COPD  (ICD-496) 28)   Asthma  (ICD-493.90)  Medications Prior to Update: 1)  Spiriva Handihaler 18 Mcg Caps (Tiotropium Bromide Monohydrate) .... Inhale Contents of 1 Capsule Once A Day 2)  Zetia 10 Mg  Tabs (Ezetimibe) .Marland Kitchen.. 1 Tablet At Bedtime 3)  Aspirin 81 Mg  Tbec (Aspirin) .... Take One By Mouth Once A Day 4)  Arimidex 1 Mg  Tabs (Anastrozole) .... Take 1 Tablet By Mouth Once A Day 5)  Protonix 40 Mg  Tbec (Pantoprazole Sodium) .... One By Mouth Two Times A Day 6)  Metformin Hcl 500 Mg  Tb24 (Metformin Hcl) .Marland Kitchen.. 1 At Bedtime 7)  Cozaar 50 Mg  Tabs (Losartan Potassium) .... Take 1 Tablet By Mouth Once A Day 8)  Catapres 0.3 Mg Tabs (Clonidine Hcl) .... One Tab By Mouth Two Times A Day 9)  Effexor Xr 75 Mg  Cp24 (Venlafaxine Hcl) .... Take One By Mouth Once A Day 10)  Calcium Carbonate-Vitamin D 600-400 Mg-Unit  Tabs (Calcium Carbonate-Vitamin D) .... Take 1 Tablet By Mouth Two Times A Day 11)  Magnesium 250 Mg Tabs (Magnesium) .... Take 1 Tablet By Mouth Once A Day 12)  Multivitamins   Tabs (Multiple Vitamin) .... Take 1 Tablet By Mouth Once A Day 13)  Cyclobenzaprine Hcl 10 Mg Tabs (Cyclobenzaprine Hcl) .... Take 1 Tablet By Mouth Two Times A Day 14)  Tylenol  Pm Extra Strength 500-25 Mg Tabs (Diphenhydramine-Apap (Sleep)) .... Take 1 Tab By Mouth At Bedtime 15)  Synthroid 137 Mcg  Tabs (Levothyroxine Sodium) .... One By Mouth Once Daily 16)  Miralax   Powd (Polyethylene Glycol 3350) .Marland Kitchen.. 1 Packet Daily As Needed 17)  Lasix 20 Mg Tabs (Furosemide) .Marland Kitchen.. 1 By Mouth Once Daily As Needed Edema 18)  Aleve 220 Mg Tabs (Naproxen Sodium) .... Per Bottle 19)  Vicodin 5-500 Mg Tabs (Hydrocodone-Acetaminophen) .... Take 1 Tablet By Mouth Every 4-6 Hours As Needed 20)  Tessalon 200 Mg Caps (Benzonatate) .Marland Kitchen.. 1 By Mouth Three Times A Day As Needed Cough 21)  Albuterol Sulfate (2.5 Mg/63ml) 0.083% Nebu (Albuterol Sulfate) .Marland Kitchen.. 1 Vial Every 4-6 Hours As Needed 22)  Prednisone 10 Mg Tabs (Prednisone) .... 4 Tabs For 4 Days,  Then 3 Tabs For 4 Days, 2 Tabs For 4 Days, Then 1 Tab For 4 Days, Then Stop 23)  Tussionex Pennkinetic Er 8-10 Mg/60ml Lqcr (Chlorpheniramine-Hydrocodone) .Marland Kitchen.. 1 Tsp Every 12 Hr As Needed Cough, May Make You Sleepy  Allergies: 1)  ! Avelox  Physical Exam  General:  Well developed obese white female , at times comfortable and in no distress but most of the time dyspneic and wheezing audibly with any kind of activity.  Head:  Normocephalic and atraumatic. Sinuses nontender. Eyes:  PERRLA, no icterus.Conjunctiva clear bilaterally.  Ears:  External ear exam shows no significant lesions or deformities.  Otoscopic examination reveals clear canals, tympanic membranes are intact bilaterally without bulging, retraction, inflammation or discharge. Hearing is grossly normal bilaterally. Nose:  External nasal examination shows no deformity or inflammation. Nasal mucosa are pink and moist without lesions or exudates. Mouth:  No deformity or lesions, dentition normal. Neck:  Supple; no masses or thyromegaly. Lungs:  Clear throughout to auscultation. Slightly decreased/distant breath sounds without rales  throughout, poor air movement. Heart:  Regular rate and rhythm; no murmurs, rubs,  or bruits. Somewhat distant Abdomen:  Soft, nontender and nondistended. No masses, hepatosplenomegaly or hernias noted. Normal bowel sounds.obese.     Impression & Recommendations:  Problem # 1:  HYPERTENSION (ICD-401.9) Assessment Unchanged Stable and good control. Cont curr meds. Her updated medication list for this problem includes:    Cozaar 50 Mg Tabs (Losartan potassium) .Marland Kitchen... Take 1 tablet by mouth once a day    Catapres 0.3 Mg Tabs (Clonidine hcl) ..... One tab by mouth two times a day    Lasix 20 Mg Tabs (Furosemide) .Marland Kitchen... 1 by mouth once daily as needed edema  BP today: 120/70 Prior BP: 134/60 (02/22/2009)  Labs Reviewed: K+: 4.6 (02/24/2009) Creat: : 1.2 (02/24/2009)   Chol: 154 (02/24/2009)   HDL:  72.90 (02/24/2009)   LDL: 41 (02/24/2009)   TG: 200.0 (02/24/2009)  Problem # 2:  HYPOTHYROIDISM (ICD-244.9) Assessment: Unchanged Stable. Cont curr dose. Her updated medication list for this problem includes:    Synthroid 137 Mcg Tabs (Levothyroxine sodium) ..... One by mouth once daily  Labs Reviewed: TSH: 0.38 (02/24/2009)    HgBA1c: 7.4 (02/24/2009) Chol: 154 (02/24/2009)   HDL: 72.90 (02/24/2009)   LDL: 41 (02/24/2009)   TG: 200.0 (02/24/2009)  Problem # 3:  DIABETES MELLITUS, TYPE II (ICD-250.00) Assessment: Unchanged Adequate altho could tighten up diet slightly. Cont curr meds. Her updated medication list for this problem includes:    Aspirin 81 Mg Tbec (Aspirin) .Marland Kitchen... Take one by mouth once a day    Metformin Hcl 500 Mg Tb24 (Metformin hcl) .Marland Kitchen... 1 at bedtime  Cozaar 50 Mg Tabs (Losartan potassium) .Marland Kitchen... Take 1 tablet by mouth once a day  Labs Reviewed: Creat: 1.2 (02/24/2009)   Microalbumin: 11.6 (10/08/2003) Reviewed HgBA1c results: 7.4 (02/24/2009)  7.0 (05/27/2007)  Problem # 4:  GERD (ICD-530.81) Assessment: Unchanged Stable. Her updated medication list for this problem includes:    Protonix 40 Mg Tbec (Pantoprazole sodium) ..... One by mouth two times a day  Problem # 5:  HYPERCHOLESTEROLEMIA / TRIG  (235/374) (ICD-272.0) Assessment: Unchanged Adequate. Try to watch sweets and carbs a little better for Trigs. Her updated medication list for this problem includes:    Zetia 10 Mg Tabs (Ezetimibe) .Marland Kitchen... 1 tablet at bedtime  Labs Reviewed: SGOT: 23 (02/24/2009)   SGPT: 44 (02/24/2009)   HDL:72.90 (02/24/2009), 47.4 (05/27/2007)  LDL:41 (02/24/2009), 69 (05/27/2007)  Chol:154 (02/24/2009), 155 (05/27/2007)  Trig:200.0 (02/24/2009), 195 (05/27/2007)  Complete Medication List: 1)  Spiriva Handihaler 18 Mcg Caps (Tiotropium bromide monohydrate) .... Inhale contents of 1 capsule once a day 2)  Zetia 10 Mg Tabs (Ezetimibe) .Marland Kitchen.. 1 tablet at bedtime 3)  Aspirin 81 Mg  Tbec (Aspirin) .... Take one by mouth once a day 4)  Arimidex 1 Mg Tabs (Anastrozole) .... Take 1 tablet by mouth once a day 5)  Protonix 40 Mg Tbec (Pantoprazole sodium) .... One by mouth two times a day 6)  Metformin Hcl 500 Mg Tb24 (Metformin hcl) .Marland Kitchen.. 1 at bedtime 7)  Cozaar 50 Mg Tabs (Losartan potassium) .... Take 1 tablet by mouth once a day 8)  Catapres 0.3 Mg Tabs (Clonidine hcl) .... One tab by mouth two times a day 9)  Effexor Xr 75 Mg Cp24 (Venlafaxine hcl) .... Take one by mouth once a day 10)  Calcium Carbonate-vitamin D 600-400 Mg-unit Tabs (Calcium carbonate-vitamin d) .... Take 1 tablet by mouth two times a day 11)  Magnesium 250 Mg Tabs (Magnesium) .... Take 1 tablet by mouth once a day 12)  Multivitamins Tabs (Multiple vitamin) .... Take 1 tablet by mouth once a day 13)  Cyclobenzaprine Hcl 10 Mg Tabs (Cyclobenzaprine hcl) .... Take 1 tablet by mouth two times a day 14)  Tylenol Pm Extra Strength 500-25 Mg Tabs (Diphenhydramine-apap (sleep)) .... Take 1 tab by mouth at bedtime 15)  Synthroid 137 Mcg Tabs (Levothyroxine sodium) .... One by mouth once daily 16)  Miralax Powd (Polyethylene glycol 3350) .Marland Kitchen.. 1 packet daily as needed 17)  Lasix 20 Mg Tabs (Furosemide) .Marland Kitchen.. 1 by mouth once daily as needed edema 18)  Aleve 220 Mg Tabs (Naproxen sodium) .... Per bottle 19)  Vicodin 5-500 Mg Tabs (Hydrocodone-acetaminophen) .... Take 1 tablet by mouth every 4-6 hours as needed 20)  Tessalon 200 Mg Caps (Benzonatate) .Marland Kitchen.. 1 by mouth three times a day as needed cough 21)  Albuterol Sulfate (2.5 Mg/39ml) 0.083% Nebu (Albuterol sulfate) .Marland Kitchen.. 1 vial every 4-6 hours as needed 22)  Prednisone 10 Mg Tabs (Prednisone) .... 4 tabs for 4 days, then 3 tabs for 4 days, 2 tabs for 4 days, then 1 tab for 4 days, then stop 23)  Tussionex Pennkinetic Er 8-10 Mg/69ml Lqcr (Chlorpheniramine-hydrocodone) .Marland Kitchen.. 1 tsp every 12 hr as needed cough, may make you sleepy  Patient Instructions: 1)  RTC as  needed. Prescriptions: SYNTHROID 137 MCG  TABS (LEVOTHYROXINE SODIUM) one by mouth once daily  #30 Tablet x 12   Entered and Authorized by:   Shaune Leeks MD   Signed by:   Shaune Leeks MD on 03/02/2009   Method  used:   Electronically to        [facility]. [address] (retail)       [address] [place], [place]  [address]       Ph: [phone]       Fax: [phone]   RxID:   [id number]   Current Allergies (reviewed today): ! AVELOX

## 2010-03-08 NOTE — Letter (Signed)
Summary: Results Follow up Letter  [place] [facility]  [address] [place], [place] [address]   Phone: [phone]  Fax: [phone]    03/09/2009 MRN: [id number]  [facility] [address] [place], [place]  [address]  Dear Ms. CARAWAN,  The following are the results of your recent test(s):  Test         Result    Pap Smear:        Normal _____  Not Normal _____ Comments: ______________________________________________________ Cholesterol: LDL(Bad cholesterol):         Your goal is less than:         HDL (Good cholesterol):       Your goal is more than: Comments:  ______________________________________________________ Mammogram:        Normal __X___  Not Normal _____ Comments: Please repeat in one year.  ___________________________________________________________________ Hemoccult:        Normal _____  Not normal _______ Comments:    _____________________________________________________________________ Other Tests:    We routinely do not discuss normal results over the telephone.  If you desire a copy of the results, or you have any questions about this information we can discuss them at your next office visit.   Sincerely,     Laurita Quint, MD

## 2010-03-08 NOTE — Assessment & Plan Note (Signed)
Summary: Pulmonary OV   Primary Provider/Referring Provider:  Joycelyn Man  CC:  Follow up.  Pt states breathing is "mostly good."  Per pt's daughter and pt is still easily SOB with activity and has a prod cough with white to yellow mucus.  .  History of Present Illness: 77  yo WF with hx of AB/COPD, VCD , and MS  April 15, 2009 11:48 AM Worse since end of last week over two week.  Started ?viral illness.  Skin hurts to touch, chills, low grade fever,  cough is productive yellow and green.  Uses tussionex/tessalon three times a day.   Feels congested and chest felt heavy.  Notes foul odor to urine and dark urine with dysuria. CXR in the office reveals PNA bilateral LL.  Pt admitted for inpatient care .  May 06, 2009 11:15 AM  Post hosp ov: was in hosp 3/10 -3/15 bilateral LL PNA on CXR. Occ mucous with cough occ tinted yellow or white.  Dyspnea is back to baseline.  No at bedtime cough.  No special diet.  Will occ choke on food.  Not incontinent at home.   on probiotic now.  now off ceftin and pred.  September 09, 2009 12:01 PM Developed UTI,  went to SNF on site at [facility] for 2.5 months. had foley in ,  took foley out but still got Klebsiella UTI.   Now back home PT three times per week. Now starting periph nerve stim for bladder issue.  [facility] wants a note in their chart.  Foley out for two weeks.   Still incontinent.  Wears depends and pull ups.  Foley two months and sphincter control issues.   ? nerve stim will help. Had some more cough since home.  Dyspnea is the same.  Cough now is productive yellow and green. No chest pain.   No fever.  Preventive Screening-Counseling & Management  Alcohol-Tobacco     Alcohol drinks/day: 2     Alcohol type: wine , couple     Smoking Status: quit > 6 months     Year Quit: 1986     Pack years: 40     Passive Smoke Exposure: no  Current Medications (verified): 1)  Spiriva Handihaler 18 Mcg Caps (Tiotropium Bromide Monohydrate) ....  Inhale Contents of 1 Capsule Once A Day 2)  Zetia 10 Mg  Tabs (Ezetimibe) .Marland Kitchen.. 1 Tablet At Bedtime 3)  Aspirin 81 Mg  Tbec (Aspirin) .... Take One By Mouth Once A Day 4)  Protonix 40 Mg  Tbec (Pantoprazole Sodium) .... One By Mouth Two Times A Day 5)  Metformin Hcl 500 Mg  Tb24 (Metformin Hcl) .Marland Kitchen.. 1 At Bedtime 6)  Cozaar 50 Mg  Tabs (Losartan Potassium) .... Take 1 Tablet By Mouth Once A Day 7)  Catapres 0.3 Mg Tabs (Clonidine Hcl) .... One Tab By Mouth Two Times A Day 8)  Effexor Xr 75 Mg  Cp24 (Venlafaxine Hcl) .... Take One By Mouth Once A Day 9)  Calcium Carbonate-Vitamin D 600-400 Mg-Unit  Tabs (Calcium Carbonate-Vitamin D) .... Take 1 Tablet By Mouth Two Times A Day 10)  Magnesium 250 Mg Tabs (Magnesium) .... Take 1 Tablet By Mouth Once A Day 11)  Multivitamins   Tabs (Multiple Vitamin) .... Take 1 Tablet By Mouth Once A Day 12)  Cyclobenzaprine Hcl 10 Mg Tabs (Cyclobenzaprine Hcl) .... Take 1 Tablet By Mouth Two Times A Day 13)  Tylenol Pm Extra Strength 500-25 Mg Tabs (Diphenhydramine-Apap (Sleep)) .Marland KitchenMarland KitchenMarland Kitchen  Take 1 Tab By Mouth At Bedtime 14)  Synthroid 137 Mcg  Tabs (Levothyroxine Sodium) .... One By Mouth Once Daily 15)  Miralax   Powd (Polyethylene Glycol 3350) .Marland Kitchen.. 1 Packet Daily As Needed 16)  Lasix 20 Mg Tabs (Furosemide) .Marland Kitchen.. 1 By Mouth Once Daily As Needed Edema 17)  Aleve 220 Mg Tabs (Naproxen Sodium) .... Per Bottle 18)  Vicodin 5-500 Mg Tabs (Hydrocodone-Acetaminophen) .... Take 1 Tablet By Mouth Every 4-6 Hours As Needed 19)  Tessalon 200 Mg Caps (Benzonatate) .Marland Kitchen.. 1 By Mouth Three Times A Day As Needed 20)  Albuterol Sulfate (2.5 Mg/8ml) 0.083% Nebu (Albuterol Sulfate) .Marland Kitchen.. 1 Vial Every 4-6 Hours As Needed 21)  Tussionex Pennkinetic Er 8-10 Mg/9ml Lqcr (Chlorpheniramine-Hydrocodone) .Marland Kitchen.. 1 Tsp Every 12 Hr As Needed Cough, May Make You Sleepy 22)  Famotidine 20 Mg Tabs (Famotidine) .... Take 1 Tab By Mouth At Bedtime 23)  Probiotic  Caps (Probiotic Product) .... Take 1 Tablet By  Mouth Two Times A Day  Allergies (verified): 1)  ! Avelox  Past History:  Past medical, surgical, family and social histories (including risk factors) reviewed, and no changes noted (except as noted below).  Past Medical History: Reviewed history from 07/05/2007 and no changes required. [name] (ICD-695.3) FATTY LIVER DISEASE VIA CT (ICD-571.8) GERD (ICD-530.81) DIABETES MELLITUS, TYPE II (ICD-250.00) POSITIVE PPD (ICD-795.5) INVOLUNTARY MOVEMENTS, ABNORMAL (ICD-781.0) HYPERCHOLESTEROLEMIA / TRIG  (235/374) (ICD-272.0) HYPOTHYROIDISM (ICD-244.9) BRONCHITIS, ACUTE (ICD-466.0) URINARY INCONTINENCE (ICD-788.30) SCLEROSIS, MULTIPLE (ICD-340) ADENOCARCINOMA, BREAST (ICD-174.9) VOCAL CORD DISORDER (ICD-478.5) HYPERTENSION (ICD-401.9) DEPRESSION (ICD-311) CORONARY ARTERY DISEASE, MINIMAL (ICD-414.00) COPD (ICD-496) ASTHMA (ICD-493.90)  Past Surgical History: Reviewed history from 06/24/2009 and no changes required. Appy 1942 Tonsillectomy 1951 D&C 1964/5 NSVD x 5 BTL 1970 Head CT- chronic sinusitis 02/99 Enoscopy normal except hiatal hernia 1980's Barium swallow- wnl 1980's MRI Head- sm vess dz, , not type of M.S. 01/99 Sinus Surgery 11/1997 Bone density wnl 01/12/98 [facility] hospitalization 05/15-22/01 Head MRI ([name]) wnl 06/01 Adenosine Cardiolite wnl 07/01 Radiologic renal cyst aspiration , left 06/04/00 CT Pelvis, left renal cyst, right sm renal cyst, fatty liver 04/02 Colonoscopy, polyps 04/02 EGD, H.H. 04/02 CT Pelvis (via Charlann Boxer) left renal cyst 02/04 Holter wnl 07/14/99 Cath, wnl (Dr. Katrinka Blazing) 12/03/02 Colonoscopy 3mm polyp destroyed 10/22/03 Right breast lumpectomy, ductal cancer 01/06 MRI of bilat breast, wnl 03/07/06 HOSP R Bimalleolar Ankle Fx/ repair     L Hand 3&4 Metacarpal Fxs    5/16-5/19/2011  Family History: Reviewed history from 05/31/2007 and no changes required. Father: Died at age 77 of coronary disease Mother: Died at age 15 of cancer of the  pancreas with diabetes and emphysema in a nonsmoker Siblings: 2 brothers, twins, both with high blood pressure and diabetes, Leukemia. 3 sisters, all deceased, one with nonalcoholic cirrhosis, one with cancer of the pancreas, one with lymphoma CV - none HBP + 2 sisters, 2 brothers DM + sisters, 2 brothers Lymphoma + sister Thyroid cancer + daughter Cancer pancr + mother Liver and pancr cancer + sister Cirrhosis + sister  Social History: Reviewed history from 10/05/2008 and no changes required. Marital Status: Married Children: 5 Occupation: Housewife, former Engineer, building services.  Lives with her husband at [facility] Daughter is [name], Charity fundraiser (former Engineer, civil (consulting) for [employer]) former smoker - quit in 1986, x81yrs, 1-1.5ppd  Review of Systems       The patient complains of shortness of breath with activity.  The patient denies shortness of breath at rest, productive cough, non-productive cough, coughing up  blood, chest pain, irregular heartbeats, acid heartburn, indigestion, loss of appetite, weight change, abdominal pain, difficulty swallowing, sore throat, tooth/dental problems, headaches, nasal congestion/difficulty breathing through nose, sneezing, itching, ear ache, anxiety, depression, hand/feet swelling, joint stiffness or pain, rash, change in color of mucus, and fever.    Vital Signs:  Patient profile:   77 year old female Height:      64 inches Weight:      220 pounds BMI:     37.90 O2 Sat:      96 % on Room air Temp:     97.7 degrees F oral Pulse rate:   84 / minute BP sitting:   130 / 68  (left arm) Cuff size:   large  Vitals Entered By: Gweneth Dimitri RN (September 09, 2009 11:50 AM)  O2 Flow:  Room air CC: Follow up.  Pt states breathing is "mostly good."  Per pt's daughter, pt is still easily SOB with activity and has a prod cough with white to yellow mucus.   Comments Medications reviewed with patient Daytime contact number verified with patient. Gweneth Dimitri RN  September 09, 2009 11:50 AM    Physical Exam  Additional Exam:  GEN: A/Ox3; pleasant , NAD HEENT:  [place]/AT, , EACs-clear, TMs-wnl, NOSE-clear, THROAT-clear NECK:  Supple w/ fair ROM; no JVD; normal carotid impulses w/o bruits; no thyromegaly or nodules palpated; no lymphadenopathy. CHEST: Coarse BS w/ improved  psuedowheezing.  HEART:  RRR, no m/r/g ABDOMEN:  Soft & nt; nml bowel sounds; no organomegaly or masses detected, no guarding, or rebound, sl distended EXT: Warm bil,  no calf pain, tr-edema- improved, clubbing, pulses intact     Impression & Recommendations:  Problem # 1:  COPD (ICD-496) Assessment Unchanged  Improved copd/AB plan cont inhaled medications as prescribed surveillance sputum c/s  Complete Medication List: 1)  Spiriva Handihaler 18 Mcg Caps (Tiotropium bromide monohydrate) .... Inhale contents of 1 capsule once a day 2)  Zetia 10 Mg Tabs (Ezetimibe) .Marland Kitchen.. 1 tablet at bedtime 3)  Aspirin 81 Mg Tbec (Aspirin) .... Take one by mouth once a day 4)  Protonix 40 Mg Tbec (Pantoprazole sodium) .... One by mouth two times a day 5)  Metformin Hcl 500 Mg Tb24 (Metformin hcl) .Marland Kitchen.. 1 at bedtime 6)  Cozaar 50 Mg Tabs (Losartan potassium) .... Take 1 tablet by mouth once a day 7)  Catapres 0.3 Mg Tabs (Clonidine hcl) .... One tab by mouth two times a day 8)  Effexor Xr 75 Mg Cp24 (Venlafaxine hcl) .... Take one by mouth once a day 9)  Calcium Carbonate-vitamin D 600-400 Mg-unit Tabs (Calcium carbonate-vitamin d) .... Take 1 tablet by mouth two times a day 10)  Magnesium 250 Mg Tabs (Magnesium) .... Take 1 tablet by mouth once a day 11)  Multivitamins Tabs (Multiple vitamin) .... Take 1 tablet by mouth once a day 12)  Cyclobenzaprine Hcl 10 Mg Tabs (Cyclobenzaprine hcl) .... Take 1 tablet by mouth two times a day 13)  Tylenol Pm Extra Strength 500-25 Mg Tabs (Diphenhydramine-apap (sleep)) .... Take 1 tab by mouth at bedtime 14)  Synthroid 137 Mcg Tabs (Levothyroxine sodium) .... One by  mouth once daily 15)  Miralax Powd (Polyethylene glycol 3350) .Marland Kitchen.. 1 packet daily as needed 16)  Lasix 20 Mg Tabs (Furosemide) .Marland Kitchen.. 1 by mouth once daily as needed edema 17)  Aleve 220 Mg Tabs (Naproxen sodium) .... Per bottle 18)  Vicodin 5-500 Mg Tabs (Hydrocodone-acetaminophen) .... Take 1 tablet  by mouth every 4-6 hours as needed 19)  Tessalon 200 Mg Caps (Benzonatate) .Marland Kitchen.. 1 by mouth three times a day as needed 20)  Albuterol Sulfate (2.5 Mg/95ml) 0.083% Nebu (Albuterol sulfate) .Marland Kitchen.. 1 vial every 4-6 hours as needed 21)  Tussionex Pennkinetic Er 8-10 Mg/46ml Lqcr (Chlorpheniramine-hydrocodone) .Marland Kitchen.. 1 tsp every 12 hr as needed cough, may make you sleepy 22)  Famotidine 20 Mg Tabs (Famotidine) .... Take 1 tab by mouth at bedtime 23)  Probiotic Caps (Probiotic product) .... Take 1 tablet by mouth two times a day  Other Orders: Est. Patient Level IV (27062) T-Culture, Sputum & Gram Stain (87070/87205-70030)  Patient Instructions: 1)  Obtain a sputum culture, you may drop off at [facility] office for processing 2)  No change in medications 3)  Return in     3     months

## 2010-03-08 NOTE — Letter (Signed)
Summary: Dr.Joseph Landau,Orthopaedic,Note  Dr.Joseph Landau,Orthopaedic,Note   Imported By: Beau Fanny 07/26/2009 13:20:51  _____________________________________________________________________  External Attachment:    Type:   Image     Comment:   External Document

## 2010-03-08 NOTE — Letter (Signed)
Summary: [name]  [name]   Imported By: Lanelle Bal 09/28/2009 13:20:39  _____________________________________________________________________  External Attachment:    Type:   Image     Comment:   External Document  Appended Document: [name] fractures healing thumb CMC injected for worsening arthritis

## 2010-03-08 NOTE — Consult Note (Signed)
Summary: Dr.Michael [facility],Note  Dr.Michael Brennan,[facility],Note   Imported By: Beau Fanny 11/30/2009 10:30:58  _____________________________________________________________________  External Attachment:    Type:   Image     Comment:   External Document  Appended Document: Dr.Michael Brennan,[facility],Note     Clinical Lists Changes  Problems: Added new problem of DIABETIC  RETINOPATHY, MILD (DR Fransico Michael) (ICD-250.50) Observations: Added new observation of DMEYEEXAMNXT: 12/2010 (11/30/2009 20:32) Added new observation of DMEYEEXMRES: diabetic retinopathy, minimal (11/24/2009 20:33) Added new observation of EYE EXAM BY: Dr Sherol Dade (11/24/2009 20:33) Added new observation of DIAB EYE EX: diabetic retinopathy, minimal (11/24/2009 20:33)        Diabetes Management Exam:    Eye Exam:       Eye Exam done elsewhere          Date: 11/24/2009          Results: diabetic retinopathy, minimal          Done by: Dr Sherol Dade

## 2010-03-08 NOTE — Letter (Signed)
Summary: [facility]  [facility]   Imported By: Lanelle Bal 11/11/2009 12:22:53  _____________________________________________________________________  External Attachment:    Type:   Image     Comment:   External Document  Appended Document: [facility]     Clinical Lists Changes  Observations: Added new observation of PAST SURG HX: Appy 1942 Tonsillectomy 1951 D&C 1964/5 NSVD x 5 BTL 1970 Head CT- chronic sinusitis 02/99 Enoscopy normal except hiatal hernia 1980's Barium swallow- wnl 1980's MRI Head- sm vess dz, , not type of M.S. 01/99 Sinus Surgery 11/1997 Bone density wnl 01/12/98 [facility] hospitalization 05/15-22/01 Head MRI ([name]) wnl 06/01 Adenosine Cardiolite wnl 07/01 Radiologic renal cyst aspiration , left 06/04/00 CT Pelvis, left renal cyst, right sm renal cyst, fatty liver 04/02 Colonoscopy, polyps 04/02 EGD, H.H. 04/02 CT Pelvis (via Charlann Boxer) left renal cyst 02/04 Holter wnl 07/14/99 Cath, wnl (Dr. Katrinka Blazing) 12/03/02 Colonoscopy 3mm polyp destroyed 10/22/03 Right breast lumpectomy, ductal cancer 01/06 MRI of bilat breast, wnl 03/07/06 HOSP R Bimalleolar Ankle Fx/ repair     L Hand 3&4 Metacarpal Fxs    5/16-5/19/2011 (11/11/2009 16:00) Added new observation of PAST MED HX: [name] (ICD-695.3) FATTY LIVER DISEASE VIA CT (ICD-571.8) GERD (ICD-530.81) DIABETES MELLITUS, TYPE II (ICD-250.00) POSITIVE PPD (ICD-795.5) INVOLUNTARY MOVEMENTS, ABNORMAL (ICD-781.0) HYPERCHOLESTEROLEMIA / TRIG  (235/374) (ICD-272.0) HYPOTHYROIDISM (ICD-244.9) BRONCHITIS, ACUTE (ICD-466.0) URINARY INCONTINENCE (ICD-788.30) SCLEROSIS, MULTIPLE (ICD-340) ADENOCARCINOMA, BREAST (ICD-174.9) VOCAL CORD DISORDER (ICD-478.5) HYPERTENSION (ICD-401.9) DEPRESSION (ICD-311) CORONARY ARTERY DISEASE, MINIMAL (ICD-414.00) COPD (ICD-496) ASTHMA (ICD-493.90) Ankle pain per Dr. Dion Saucier (11/11/2009  16:00)       Past History:  Past Medical History: [name] (ICD-695.3) FATTY LIVER DISEASE VIA CT (ICD-571.8) GERD (ICD-530.81) DIABETES MELLITUS, TYPE II (ICD-250.00) POSITIVE PPD (ICD-795.5) INVOLUNTARY MOVEMENTS, ABNORMAL (ICD-781.0) HYPERCHOLESTEROLEMIA / TRIG  (235/374) (ICD-272.0) HYPOTHYROIDISM (ICD-244.9) BRONCHITIS, ACUTE (ICD-466.0) URINARY INCONTINENCE (ICD-788.30) SCLEROSIS, MULTIPLE (ICD-340) ADENOCARCINOMA, BREAST (ICD-174.9) VOCAL CORD DISORDER (ICD-478.5) HYPERTENSION (ICD-401.9) DEPRESSION (ICD-311) CORONARY ARTERY DISEASE, MINIMAL (ICD-414.00) COPD (ICD-496) ASTHMA (ICD-493.90) Ankle pain per Dr. Dion Saucier  Past Surgical History: Appy 1942 Tonsillectomy 1951 D&C 1964/5 NSVD x 5 BTL 1970 Head CT- chronic sinusitis 02/99 Enoscopy normal except hiatal hernia 1980's Barium swallow- wnl 1980's MRI Head- sm vess dz, , not type of M.S. 01/99 Sinus Surgery 11/1997 Bone density wnl 01/12/98 [facility] hospitalization 05/15-22/01 Head MRI ([name]) wnl 06/01 Adenosine Cardiolite wnl 07/01 Radiologic renal cyst aspiration , left 06/04/00 CT Pelvis, left renal cyst, right sm renal cyst, fatty liver 04/02 Colonoscopy, polyps 04/02 EGD, H.H. 04/02 CT Pelvis (via Charlann Boxer) left renal cyst 02/04 Holter wnl 07/14/99 Cath, wnl (Dr. Katrinka Blazing) 12/03/02 Colonoscopy 3mm polyp destroyed 10/22/03 Right breast lumpectomy, ductal cancer 01/06 MRI of bilat breast, wnl 03/07/06 HOSP R Bimalleolar Ankle Fx/ repair     L Hand 3&4 Metacarpal Fxs    5/16-5/19/2011

## 2010-03-08 NOTE — Assessment & Plan Note (Signed)
Summary: Pulmonary OV   Visit Type:  Hospital Follow-up Primary Provider/Referring Provider:  Joycelyn Man  CC:  Pt here for hospital follow up. Pt states breathing overall is better. Pt c/o some wheezing and intermittent productive cough with small amounts of white to pale yellow mucus.  History of Present Illness: 77  yo WF with hx of AB/COPD, VCD , and MS  April 15, 2009 11:48 AM Worse since end of last week over two week.  Started ?viral illness.  Skin hurts to touch, chills, low grade fever,  cough is productive yellow and green.  Uses tussionex/tessalon three times a day.   Feels congested and chest felt heavy.  Notes foul odor to urine and dark urine with dysuria. CXR in the office reveals PNA bilateral LL.  Pt admitted for inpatient care .  May 06, 2009 11:15 AM  Post hosp ov: was in hosp 3/10 -3/15 bilateral LL PNA on CXR. Occ mucous with cough occ tinted yellow or white.  Dyspnea is back to baseline.  No at bedtime cough.  No special diet.  Will occ choke on food.  Not incontinent at home.   on probiotic now.  now off ceftin and pred.  Preventive Screening-Counseling & Management  Alcohol-Tobacco     Smoking Status: quit > 6 months  Current Medications (verified): 1)  Spiriva Handihaler 18 Mcg Caps (Tiotropium Bromide Monohydrate) .... Inhale Contents of 1 Capsule Once A Day 2)  Zetia 10 Mg  Tabs (Ezetimibe) .Marland Kitchen.. 1 Tablet At Bedtime 3)  Aspirin 81 Mg  Tbec (Aspirin) .... Take One By Mouth Once A Day 4)  Arimidex 1 Mg  Tabs (Anastrozole) .... Take 1 Tablet By Mouth Once A Day 5)  Protonix 40 Mg  Tbec (Pantoprazole Sodium) .... One By Mouth Two Times A Day 6)  Metformin Hcl 500 Mg  Tb24 (Metformin Hcl) .Marland Kitchen.. 1 At Bedtime 7)  Cozaar 50 Mg  Tabs (Losartan Potassium) .... Take 1 Tablet By Mouth Once A Day 8)  Catapres 0.3 Mg Tabs (Clonidine Hcl) .... One Tab By Mouth Two Times A Day 9)  Effexor Xr 75 Mg  Cp24 (Venlafaxine Hcl) .... Take One By Mouth Once A Day 10)   Calcium Carbonate-Vitamin D 600-400 Mg-Unit  Tabs (Calcium Carbonate-Vitamin D) .... Take 1 Tablet By Mouth Two Times A Day 11)  Magnesium 250 Mg Tabs (Magnesium) .... Take 1 Tablet By Mouth Once A Day 12)  Multivitamins   Tabs (Multiple Vitamin) .... Take 1 Tablet By Mouth Once A Day 13)  Cyclobenzaprine Hcl 10 Mg Tabs (Cyclobenzaprine Hcl) .... Take 1 Tablet By Mouth Two Times A Day 14)  Tylenol Pm Extra Strength 500-25 Mg Tabs (Diphenhydramine-Apap (Sleep)) .... Take 1 Tab By Mouth At Bedtime 15)  Synthroid 137 Mcg  Tabs (Levothyroxine Sodium) .... One By Mouth Once Daily 16)  Miralax   Powd (Polyethylene Glycol 3350) .Marland Kitchen.. 1 Packet Daily As Needed 17)  Lasix 20 Mg Tabs (Furosemide) .Marland Kitchen.. 1 By Mouth Once Daily As Needed Edema 18)  Aleve 220 Mg Tabs (Naproxen Sodium) .... Per Bottle 19)  Vicodin 5-500 Mg Tabs (Hydrocodone-Acetaminophen) .... Take 1 Tablet By Mouth Every 4-6 Hours As Needed 20)  Tessalon 200 Mg Caps (Benzonatate) .Marland Kitchen.. 1 By Mouth Three Times A Day As Needed 21)  Albuterol Sulfate (2.5 Mg/23ml) 0.083% Nebu (Albuterol Sulfate) .Marland Kitchen.. 1 Vial Every 4-6 Hours As Needed 22)  Tussionex Pennkinetic Er 8-10 Mg/65ml Lqcr (Chlorpheniramine-Hydrocodone) .Marland Kitchen.. 1 Tsp Every 12 Hr As Needed Cough, May  Make You Sleepy 23)  Famotidine 20 Mg Tabs (Famotidine) .... Take 1 Tab By Mouth At Bedtime 24)  Zyrtec Allergy 10 Mg Tabs (Cetirizine Hcl) .... Take 1 Tab By Mouth At Bedtime 25)  Probiotic  Caps (Probiotic Product) .... Take 1 Tablet By Mouth Two Times A Day  Allergies (verified): 1)  ! Avelox  Past History:  Past medical, surgical, family and social histories (including risk factors) reviewed, and no changes noted (except as noted below).  Past Medical History: Reviewed history from 07/05/2007 and no changes required. [name] (ICD-695.3) FATTY LIVER DISEASE VIA CT (ICD-571.8) GERD (ICD-530.81) DIABETES MELLITUS, TYPE II (ICD-250.00) POSITIVE PPD (ICD-795.5) INVOLUNTARY MOVEMENTS, ABNORMAL  (ICD-781.0) HYPERCHOLESTEROLEMIA / TRIG  (235/374) (ICD-272.0) HYPOTHYROIDISM (ICD-244.9) BRONCHITIS, ACUTE (ICD-466.0) URINARY INCONTINENCE (ICD-788.30) SCLEROSIS, MULTIPLE (ICD-340) ADENOCARCINOMA, BREAST (ICD-174.9) VOCAL CORD DISORDER (ICD-478.5) HYPERTENSION (ICD-401.9) DEPRESSION (ICD-311) CORONARY ARTERY DISEASE, MINIMAL (ICD-414.00) COPD (ICD-496) ASTHMA (ICD-493.90)  Past Surgical History: Reviewed history from 07/05/2007 and no changes required. Appy 1942 Tonsillectomy 1951 D&C 1964/5 NSVD x 5 BTL 1970 Head CT- chronic sinusitis 02/99 Enoscopy normal except hiatal hernia 1980's Barium swallow- wnl 1980's MRI Head- sm vess dz, , not type of M.S. 01/99 Sinus Surgery 11/1997 Bone density wnl 01/12/98 [facility] hospitalization 05/15-22/01 Head MRI ([name]) wnl 06/01 Adenosine Cardiolite wnl 07/01 Radiologic renal cyst aspiration , left 06/04/00 CT Pelvis, left renal cyst, right sm renal cyst, fatty liver 04/02 Colonoscopy, polyps 04/02 EGD, [facility].H. 04/02 CT Pelvis (via Charlann Boxer) left renal cyst 02/04 Holter wnl 07/14/99 Cath, wnl (Dr. Katrinka Blazing) 12/03/02 Colonoscopy 3mm polyp destroyed 10/22/03 Right breast lumpectomy, ductal cancer 01/06 MRI of bilat breast, wnl 03/07/06  Family History: Reviewed history from 05/31/2007 and no changes required. Father: Died at age 69 of coronary disease Mother: Died at age 10 of cancer of the pancreas with diabetes and emphysema in a nonsmoker Siblings: 2 brothers, twins, both with high blood pressure and diabetes, Leukemia. 3 sisters, all deceased, one with nonalcoholic cirrhosis, one with cancer of the pancreas, one with lymphoma CV - none HBP + 2 sisters, 2 brothers DM + sisters, 2 brothers Lymphoma + sister Thyroid cancer + daughter Cancer pancr + mother Liver and pancr cancer + sister Cirrhosis + sister  Social History: Reviewed history from 10/05/2008 and no changes required. Marital Status: Married Children: 5 Occupation:  Housewife, former Engineer, building services.  Lives with her husband at [facility] Daughter is [name], Charity fundraiser (former Engineer, civil (consulting) for [employer]) former smoker - quit in 1986, x17yrs, 1-1.5ppd  Review of Systems       The patient complains of shortness of breath with activity and non-productive cough.  The patient denies shortness of breath at rest, productive cough, coughing up blood, chest pain, irregular heartbeats, acid heartburn, indigestion, loss of appetite, weight change, abdominal pain, difficulty swallowing, sore throat, tooth/dental problems, headaches, nasal congestion/difficulty breathing through nose, sneezing, itching, ear ache, anxiety, depression, hand/feet swelling, joint stiffness or pain, rash, change in color of mucus, and fever.    Vital Signs:  Patient profile:   77 year old female Height:      64 inches Weight:      239 pounds O2 Sat:      98 % on Room air Temp:     97.8 degrees F oral Pulse rate:   90 / minute BP sitting:   110 / 60  (left arm) Cuff size:   large  Vitals Entered By: Zackery Barefoot CMA (May 06, 2009 11:05 AM)  O2 Flow:  Room  air CC: Pt here for hospital follow up. Pt states breathing overall is better. Pt c/o some wheezing, intermittent productive cough with small amounts of white to pale yellow mucus Comments Medications reviewed with patient Verified contact number and pharmacy with patient Zackery Barefoot CMA  May 06, 2009 11:06 AM    Physical Exam  Additional Exam:  GEN: A/Ox3; pleasant , NAD HEENT:  [place]/AT, , EACs-clear, TMs-wnl, NOSE-clear, THROAT-clear NECK:  Supple w/ fair ROM; no JVD; normal carotid impulses w/o bruits; no thyromegaly or nodules palpated; no lymphadenopathy. CHEST: Coarse BS w/ improved  psuedowheezing.  HEART:  RRR, no m/r/g ABDOMEN:  Soft & nt; nml bowel sounds; no organomegaly or masses detected, no guarding, or rebound, sl distended EXT: Warm bil,  no calf pain, tr-edema- improved, clubbing, pulses intact     Impression  & Recommendations:  Problem # 1:  PNEUMONIA (ICD-486) Assessment Improved resolved Cap plan no further abx  Orders: Est. Patient Level III (29562)  Problem # 2:  COPD (ICD-496) Assessment: Improved  Improved copd/AB plan cont inhaled medications as prescribed  Medications Added to Medication List This Visit: 1)  Tessalon 200 Mg Caps (Benzonatate) .Marland Kitchen.. 1 by mouth three times a day as needed 2)  Famotidine 20 Mg Tabs (Famotidine) .... Take 1 tab by mouth at bedtime 3)  Zyrtec Allergy 10 Mg Tabs (Cetirizine hcl) .... Take 1 tab by mouth at bedtime 4)  Probiotic Caps (Probiotic product) .... Take 1 tablet by mouth two times a day  Complete Medication List: 1)  Spiriva Handihaler 18 Mcg Caps (Tiotropium bromide monohydrate) .... Inhale contents of 1 capsule once a day 2)  Zetia 10 Mg Tabs (Ezetimibe) .Marland Kitchen.. 1 tablet at bedtime 3)  Aspirin 81 Mg Tbec (Aspirin) .... Take one by mouth once a day 4)  Arimidex 1 Mg Tabs (Anastrozole) .... Take 1 tablet by mouth once a day 5)  Protonix 40 Mg Tbec (Pantoprazole sodium) .... One by mouth two times a day 6)  Metformin Hcl 500 Mg Tb24 (Metformin hcl) .Marland Kitchen.. 1 at bedtime 7)  Cozaar 50 Mg Tabs (Losartan potassium) .... Take 1 tablet by mouth once a day 8)  Catapres 0.3 Mg Tabs (Clonidine hcl) .... One tab by mouth two times a day 9)  Effexor Xr 75 Mg Cp24 (Venlafaxine hcl) .... Take one by mouth once a day 10)  Calcium Carbonate-vitamin D 600-400 Mg-unit Tabs (Calcium carbonate-vitamin d) .... Take 1 tablet by mouth two times a day 11)  Magnesium 250 Mg Tabs (Magnesium) .... Take 1 tablet by mouth once a day 12)  Multivitamins Tabs (Multiple vitamin) .... Take 1 tablet by mouth once a day 13)  Cyclobenzaprine Hcl 10 Mg Tabs (Cyclobenzaprine hcl) .... Take 1 tablet by mouth two times a day 14)  Tylenol Pm Extra Strength 500-25 Mg Tabs (Diphenhydramine-apap (sleep)) .... Take 1 tab by mouth at bedtime 15)  Synthroid 137 Mcg Tabs (Levothyroxine sodium)  .... One by mouth once daily 16)  Miralax Powd (Polyethylene glycol 3350) .Marland Kitchen.. 1 packet daily as needed 17)  Lasix 20 Mg Tabs (Furosemide) .Marland Kitchen.. 1 by mouth once daily as needed edema 18)  Aleve 220 Mg Tabs (Naproxen sodium) .... Per bottle 19)  Vicodin 5-500 Mg Tabs (Hydrocodone-acetaminophen) .... Take 1 tablet by mouth every 4-6 hours as needed 20)  Tessalon 200 Mg Caps (Benzonatate) .Marland Kitchen.. 1 by mouth three times a day as needed 21)  Albuterol Sulfate (2.5 Mg/17ml) 0.083% Nebu (Albuterol sulfate) .Marland Kitchen.. 1 vial every 4-6 hours  as needed 22)  Tussionex Pennkinetic Er 8-10 Mg/28ml Lqcr (Chlorpheniramine-hydrocodone) .Marland Kitchen.. 1 tsp every 12 hr as needed cough, may make you sleepy 23)  Famotidine 20 Mg Tabs (Famotidine) .... Take 1 tab by mouth at bedtime 24)  Zyrtec Allergy 10 Mg Tabs (Cetirizine hcl) .... Take 1 tab by mouth at bedtime 25)  Probiotic Caps (Probiotic product) .... Take 1 tablet by mouth two times a day  Patient Instructions: 1)  No change in medications 2)  Return  two months [place]

## 2010-03-08 NOTE — Progress Notes (Signed)
Summary: EGD dili appt   Phone Note Outgoing Call   Summary of Call: See if we can have her do EGD/dili (no fluoro needed0  noonish-oneish 12/14.  Iva Boop MD, [facility]  December 28, 2009 5:40 PM   Follow-up for Phone Call        appt is scheduled with Noreene Larsson for 12/14 @ 11:45am.  Pt daughter is aware of time/date and this is OK with their schedule. Follow-up by: Francee Piccolo CMA Duncan Dull),  December 29, 2009 10:34 AM

## 2010-03-08 NOTE — Letter (Signed)
Summary: Diabetic Instructions  [facility]  [address] [place], [place] [address]   Phone: [phone]  Fax: [phone]    Ana Nunez January 16, 1930 MRN: [id number]   _x_   ORAL DIABETIC MEDICATION INSTRUCTIONS  The day before your procedure:   Take your diabetic pill as you do normally  The day of your procedure:   Do not take your diabetic pill    We will check your blood sugar levels during the admission process and again in Recovery before discharging you home  ________________________________________________________________________

## 2010-03-08 NOTE — Progress Notes (Signed)
Summary: cyclobenzaprine   Phone Note Refill Request Message from:  Fax from Pharmacy on December 07, 2009 2:47 PM  Refills Requested: Medication #1:  CYCLOBENZAPRINE HCL 10 MG TABS Take 1 tablet by mouth two times a day   Last Refilled: 02/01/2009 Refill request from [facility]. [phone]  Initial call taken by: Melody Comas,  December 07, 2009 2:48 PM    Prescriptions: CYCLOBENZAPRINE HCL 10 MG TABS (CYCLOBENZAPRINE HCL) Take 1 tablet by mouth two times a day  #180 x 3   Entered and Authorized by:   [name] MD   Signed by:   [name] MD on 12/07/2009   Method used:   Electronically to        [facility] [address] (retail)       [address] [place], [place]  [address]       Ph: [phone]       Fax: [phone]   RxID:   [id number]

## 2010-03-08 NOTE — Assessment & Plan Note (Signed)
Summary: Pulmonary OV   Primary Provider/Referring Provider:  Joycelyn Man  CC:  sob with exertion, whezzing, and cough non production.  History of Present Illness: 77  yo WF with hx of AB/COPD, VCD , and MS  April 15, 2009 11:48 AM Worse since end of last week over two week.  Started ?viral illness.  Skin hurts to touch, chills, low grade fever,  cough is productive yellow and green.  Uses tussionex/tessalon three times a day.   Feels congested and chest felt heavy.  Notes foul odor to urine and dark urine with dysuria. CXR in the office reveals PNA bilateral LL.  Pt admitted for inpatient care .  May 06, 2009 11:15 AM  Post hosp ov: was in hosp 3/10 -3/15 bilateral LL PNA on CXR. Occ mucous with cough occ tinted yellow or white.  Dyspnea is back to baseline.  No at bedtime cough.  No special diet.  Will occ choke on food.  Not incontinent at home.   on probiotic now.  now off ceftin and pred.  September 09, 2009 12:01 PM Developed UTI,  went to SNF on site at [facility] for 2.5 months. had foley in ,  took foley out but still got Klebsiella UTI.   Now back home PT three times per week. Now starting periph nerve stim for bladder issue.  [facility] wants a note in their chart.  Foley out for two weeks.   Still incontinent.  Wears depends and pull ups.  Foley two months and sphincter control issues.   ? nerve stim will help. Had some more cough since home.  Dyspnea is the same.  Cough now is productive yellow and green. No chest pain.   No fever.December 09, 2009 12:16 PM Bladder issues slightly better.   Cough is sl worse,  not severe.  No mucus.  Notes more wheezing.  Had flu vaccine already   Current Medications (verified): 1)  Spiriva Handihaler 18 Mcg Caps (Tiotropium Bromide Monohydrate) .... Inhale Contents of 1 Capsule Once A Day 2)  Zetia 10 Mg  Tabs (Ezetimibe) .Marland Kitchen.. 1 Tablet At Bedtime 3)  Aspirin 81 Mg  Tbec (Aspirin) .... Take One By Mouth Once A Day 4)  Protonix 40 Mg   Tbec (Pantoprazole Sodium) .... One By Mouth Two Times A Day 5)  Metformin Hcl 500 Mg  Tb24 (Metformin Hcl) .Marland Kitchen.. 1 At Bedtime 6)  Cozaar 50 Mg  Tabs (Losartan Potassium) .... Take 1 Tablet By Mouth Once A Day 7)  Catapres 0.3 Mg Tabs (Clonidine Hcl) .... One Tab By Mouth Two Times A Day 8)  Effexor Xr 75 Mg  Cp24 (Venlafaxine Hcl) .... Take One By Mouth Once A Day 9)  Calcium Carbonate-Vitamin D 600-400 Mg-Unit  Tabs (Calcium Carbonate-Vitamin D) .... Take 1 Tablet By Mouth Two Times A Day 10)  Magnesium 250 Mg Tabs (Magnesium) .... Take 1 Tablet By Mouth Once A Day 11)  Multivitamins   Tabs (Multiple Vitamin) .... Take 1 Tablet By Mouth Once A Day 12)  Cyclobenzaprine Hcl 10 Mg Tabs (Cyclobenzaprine Hcl) .... Take 1 Tablet By Mouth Two Times A Day 13)  Tylenol Pm Extra Strength 500-25 Mg Tabs (Diphenhydramine-Apap (Sleep)) .... Take 1 Tab By Mouth At Bedtime 14)  Synthroid 137 Mcg  Tabs (Levothyroxine Sodium) .... One By Mouth Once Daily 15)  Miralax   Powd (Polyethylene Glycol 3350) .Marland Kitchen.. 1 Packet Daily As Needed 16)  Lasix 20 Mg Tabs (Furosemide) .Marland Kitchen.. 1 By Mouth  Once Daily As Needed Edema 17)  Aleve 220 Mg Tabs (Naproxen Sodium) .... Per Bottle 18)  Vicodin 5-500 Mg Tabs (Hydrocodone-Acetaminophen) .... Take 1 Tablet By Mouth Every 4-6 Hours As Needed 19)  Tessalon 200 Mg Caps (Benzonatate) .Marland Kitchen.. 1 By Mouth Three Times A Day As Needed 20)  Albuterol Sulfate (2.5 Mg/74ml) 0.083% Nebu (Albuterol Sulfate) .Marland Kitchen.. 1 Vial Every 4-6 Hours As Needed 21)  Tussionex Pennkinetic Er 8-10 Mg/15ml Lqcr (Chlorpheniramine-Hydrocodone) .Marland Kitchen.. 1 Tsp Every 12 Hr As Needed Cough, May Make You Sleepy 22)  Famotidine 20 Mg Tabs (Famotidine) .... Take 1 Tab By Mouth At Bedtime 23)  Probiotic  Caps (Probiotic Product) .... Take 1 Tablet By Mouth Two Times A Day 24)  Toviaz 4 Mg Xr24h-Tab (Fesoterodine Fumarate) .Marland Kitchen.. 1 Tab in Am  Allergies: 1)  ! Avelox  Past History:  Past medical, surgical, family and social  histories (including risk factors) reviewed, and no changes noted (except as noted below).  Past Medical History: Reviewed history from 11/11/2009 and no changes required. [name] (ICD-695.3) FATTY LIVER DISEASE VIA CT (ICD-571.8) GERD (ICD-530.81) DIABETES MELLITUS, TYPE II (ICD-250.00) POSITIVE PPD (ICD-795.5) INVOLUNTARY MOVEMENTS, ABNORMAL (ICD-781.0) HYPERCHOLESTEROLEMIA / TRIG  (235/374) (ICD-272.0) HYPOTHYROIDISM (ICD-244.9) BRONCHITIS, ACUTE (ICD-466.0) URINARY INCONTINENCE (ICD-788.30) SCLEROSIS, MULTIPLE (ICD-340) ADENOCARCINOMA, BREAST (ICD-174.9) VOCAL CORD DISORDER (ICD-478.5) HYPERTENSION (ICD-401.9) DEPRESSION (ICD-311) CORONARY ARTERY DISEASE, MINIMAL (ICD-414.00) COPD (ICD-496) ASTHMA (ICD-493.90) Ankle pain per Dr. Dion Saucier  Past Surgical History: Reviewed history from 11/11/2009 and no changes required. Appy 1942 Tonsillectomy 1951 D&C 1964/5 NSVD x 5 BTL 1970 Head CT- chronic sinusitis 02/99 Enoscopy normal except hiatal hernia 1980's Barium swallow- wnl 1980's MRI Head- sm vess dz, , not type of M.S. 01/99 Sinus Surgery 11/1997 Bone density wnl 01/12/98 [facility] hospitalization 05/15-22/01 Head MRI ([name]) wnl 06/01 Adenosine Cardiolite wnl 07/01 Radiologic renal cyst aspiration , left 06/04/00 CT Pelvis, left renal cyst, right sm renal cyst, fatty liver 04/02 Colonoscopy, polyps 04/02 EGD, H.H. 04/02 CT Pelvis (via Charlann Boxer) left renal cyst 02/04 Holter wnl 07/14/99 Cath, wnl (Dr. Katrinka Blazing) 12/03/02 Colonoscopy 3mm polyp destroyed 10/22/03 Right breast lumpectomy, ductal cancer 01/06 MRI of bilat breast, wnl 03/07/06 HOSP R Bimalleolar Ankle Fx/ repair     L Hand 3&4 Metacarpal Fxs    5/16-5/19/2011  Family History: Reviewed history from 05/31/2007 and no changes required. Father: Died at age 33 of coronary disease Mother: Died at age 20 of cancer of the pancreas with diabetes and emphysema in a nonsmoker Siblings: 2 brothers, twins, both with high  blood pressure and diabetes, Leukemia. 3 sisters, all deceased, one with nonalcoholic cirrhosis, one with cancer of the pancreas, one with lymphoma CV - none HBP + 2 sisters, 2 brothers DM + sisters, 2 brothers Lymphoma + sister Thyroid cancer + daughter Cancer pancr + mother Liver and pancr cancer + sister Cirrhosis + sister  Social History: Reviewed history from 10/05/2008 and no changes required. Marital Status: Married Children: 5 Occupation: Housewife, former Engineer, building services.  Lives with her husband at [facility] Daughter is [name], Charity fundraiser (former Engineer, civil (consulting) for [employer]) former smoker - quit in 1986, x70yrs, 1-1.5ppd  Vital Signs:  Patient profile:   77 year old female Height:      64 inches Weight:      236 pounds BMI:     40.66 O2 Sat:      96 % on Room air Temp:     97.4 degrees F oral Pulse rate:   86 / minute BP sitting:  120 / 72  (right arm) Cuff size:   large  Vitals Entered By: Kandice Hams CMA (December 09, 2009 12:07 PM)  O2 Flow:  Room air CC: sob with exertion, whezzing, cough non production   Immunization History:  Influenza Immunization History:    Influenza:  fluvax 3+ (11/25/2009)   Physical Exam  Additional Exam:  GEN: A/Ox3; pleasant , NAD HEENT:  [place]/AT, , EACs-clear, TMs-wnl, NOSE-clear, THROAT-clear NECK:  Supple w/ fair ROM; no JVD; normal carotid impulses w/o bruits; no thyromegaly or nodules palpated; no lymphadenopathy. CHEST: Coarse BS w/ improved  psuedowheezing.  HEART:  RRR, no m/r/g ABDOMEN:  Soft & nt; nml bowel sounds; no organomegaly or masses detected, no guarding, or rebound, sl distended EXT: Warm bil,  no calf pain, tr-edema- improved, clubbing, pulses intact     Impression & Recommendations:  Problem # 1:  COPD (ICD-496) Assessment Unchanged  Improved copd/AB plan cont inhaled medications as prescribed  Medications Added to Medication List This Visit: 1)  Toviaz 4 Mg Xr24h-tab (Fesoterodine fumarate) .Marland Kitchen.. 1 tab in  am  Other Orders: Est. Patient Level III (73220)  Patient Instructions: 1)  I question why you are on magnesium and pepcid (famotidine) 2)  No othe medication changes 3)  Return 3 months  Prevention & Chronic Care Immunizations   Influenza vaccine: Fluvax 3+  (11/25/2009)    Tetanus booster: 09/06/1997: Td    Pneumococcal vaccine: Pneumovax  (10/29/2006)    H. zoster vaccine: Not documented  Colorectal Screening   Hemoccult: Negative  (04/25/2000)    Colonoscopy: 3 mm descending colon polyp cauterized to destruction  Very tortuous redundant colon, otherwise normal  Exam performed for surveillance of adenomatous polyps  Instructed to have another colonoscopy in 7 years per Dr. Victorino Dike  (10/22/2003)  Other Screening   Pap smear: Done/GYN   (02/07/2002)    Mammogram: Normal Bilateral  (03/09/2009)   Mammogram due: 03/2010    DXA bone density scan: Not documented   Smoking status: quit > 6 months  (09/09/2009)  Diabetes Mellitus   HgbA1C: 7.4  (02/24/2009)    Eye exam: diabetic retinopathy, minimal  (11/24/2009)   Eye exam due: 12/2010    Foot exam: Not documented   High risk foot: Not documented   Foot care education: Not documented    Urine microalbumin/creatinine ratio: 10.2  (05/28/2007)  Lipids   Total Cholesterol: 154  (02/24/2009)   LDL: 41  (02/24/2009)   LDL Direct: Not documented   HDL: 72.90  (02/24/2009)   Triglycerides: 200.0  (02/24/2009)    SGOT (AST): 23  (02/24/2009)   SGPT (ALT): 44  (02/24/2009)   Alkaline phosphatase: 92  (02/24/2009)   Total bilirubin: 0.9  (02/24/2009)  Hypertension   Last Blood Pressure: 120 / 72  (12/09/2009)   Serum creatinine: 1.2  (02/24/2009)   Serum potassium 4.6  (02/24/2009)  Self-Management Support :    Diabetes self-management support: Not documented    Hypertension self-management support: Not documented    Lipid self-management support: Not documented

## 2010-03-08 NOTE — Letter (Signed)
Summary: [name]  [name]   Imported By: Sherian Rein 01/11/2010 11:06:20  _____________________________________________________________________  External Attachment:    Type:   Image     Comment:   External Document

## 2010-03-08 NOTE — Letter (Signed)
Summary: [facility]  [facility]   Imported By: Sherian Rein 11/09/2009 15:06:52  _____________________________________________________________________  External Attachment:    Type:   Image     Comment:   External Document

## 2010-03-08 NOTE — Assessment & Plan Note (Signed)
Summary: 1 week/ mbw   Primary Provider/Referring Provider:  Joycelyn Man  CC:  1 wk follow up. states breathing is a little bit better but still having "extreme SOB with any movement, "  still using nebs 1-2 times/day with little difference after using them, and and still fatigued.  cough is minimal now-dry..  History of Present Illness: 77  yo WF with hx of AB/COPD, VCD , and MS  January 20, 2008 10:21 AM  Better since last OV 11/09 with NP Monique Gift.     August 06, 2008  Currently the pt feels no energy, has poor balance though dyspnea and cough are better.  Had RUL PNA but now less cough.  Still pain on R side with deep breath.  Pt stopped all inhalers now back on same.   August 25, 2008 --This week is better. Not using symbicort regularly.  No cough.  No chest pain.  Dyspnea much better. CT chest did not reveal any chronic abnormalities. No CA seen on CT scan.  PNA had cleared on CT.  September 08, 2008--Returns for follow up and med review. Doing much better and cough/dyspnea are much improved. Brought all her meds today. Wants to known if any she can stop. Discussed all meds. Symbicort was stopped last visit w/o flare.   October 05, 2008--Presents for an acute office visit. Complains of increased SOB, wheezing x3-4days,    October 15, 2008--At last ov pt saw NP, Levaquin 750 and steroid taper.  December 17, 2008  Cough ok,  has a tickle in the throat.  some mucous but not much.  still with pseudowheeze.No neuro changes.  Had sleep study done:  narcolepsy.  no sleep apnea seen achilles tendon is an issue and needs to be lengthened.  needs surgery with a block.  minimal conscious sedation as outpatient dr troxler at [facility].  February 03, 2009--Presents for an acute office viist. Complains of increased SOB, wheezing, prod cough with yellow mucus, some chills x2weeks - increased neb use x1week.  OTC not helping.    February 15, 2009--Presents for an acute office visit  for persistent symptoms. Complains of increased SOB, wheezing, prod cough with yellow/white sputum, f/c/s, increased fatigue, increased use of albuterol neb - worse since last OV. Last visit tx w/ zpack and steroid burst. Daughter with her today. Eating good. Cough is wearing her out. Wheezing is no better.  February 22, 2009 --Returns for follow up . Last visit with [facility] with flare of VCD. Pt treated wtih Levaquin x 7 d, steroids, and aggressive cough, GERD  and rhinits prevention. Unfortunately she only took abx and steroids , did not add pepcid, zrtec and not using muciinex or tessalon.  She is feeliing some better with less wheeizng and cough. She continues to have loud upper airway psuedowheezing. Denies chest pain, orthopnea, hemoptysis, fever, n/v/d, edema, headache. Her daughter-Kim is with her today, we discussed importance of pt taking meds as directed. CXR last visit showed chronic changes with resolved infiltates   Current Medications (verified): 1)  Spiriva Handihaler 18 Mcg Caps (Tiotropium Bromide Monohydrate) .... Inhale Contents of 1 Capsule Once A Day 2)  Zetia 10 Mg  Tabs (Ezetimibe) .Marland Kitchen.. 1 Tablet At Bedtime 3)  Aspirin 81 Mg  Tbec (Aspirin) .... Take One By Mouth Once A Day 4)  Arimidex 1 Mg  Tabs (Anastrozole) .... Take 1 Tablet By Mouth Once A Day 5)  Protonix 40 Mg  Tbec (Pantoprazole Sodium) .Marland KitchenMarland KitchenMarland Kitchen  One By Mouth Two Times A Day 6)  Metformin Hcl 500 Mg  Tb24 (Metformin Hcl) .Marland Kitchen.. 1 At Bedtime 7)  Cozaar 50 Mg  Tabs (Losartan Potassium) .... Take 1 Tablet By Mouth Once A Day 8)  Catapres 0.3 Mg Tabs (Clonidine Hcl) .... One Tab By Mouth Two Times A Day 9)  Effexor Xr 75 Mg  Cp24 (Venlafaxine Hcl) .... Take One By Mouth Once A Day 10)  Calcium Carbonate-Vitamin D 600-400 Mg-Unit  Tabs (Calcium Carbonate-Vitamin D) .... Take 1 Tablet By Mouth Two Times A Day 11)  Magnesium 250 Mg Tabs (Magnesium) .... Take 1 Tablet By Mouth Once A Day 12)  Multivitamins   Tabs (Multiple Vitamin) ....  Take 1 Tablet By Mouth Once A Day 13)  Cyclobenzaprine Hcl 10 Mg Tabs (Cyclobenzaprine Hcl) .... Take 1 Tablet By Mouth Two Times A Day 14)  Tylenol Pm Extra Strength 500-25 Mg Tabs (Diphenhydramine-Apap (Sleep)) .... Take 1 Tab By Mouth At Bedtime 15)  Synthroid 137 Mcg  Tabs (Levothyroxine Sodium) .... One By Mouth Once Daily 16)  Miralax   Powd (Polyethylene Glycol 3350) .Marland Kitchen.. 1 Packet Daily As Needed 17)  Lasix 20 Mg Tabs (Furosemide) .Marland Kitchen.. 1 By Mouth Once Daily As Needed Edema 18)  Aleve 220 Mg Tabs (Naproxen Sodium) .... Per Bottle 19)  Vicodin 5-500 Mg Tabs (Hydrocodone-Acetaminophen) .... Take 1 Tablet By Mouth Every 4-6 Hours As Needed 20)  Tessalon 200 Mg Caps (Benzonatate) .Marland Kitchen.. 1 By Mouth Three Times A Day As Needed Cough 21)  Albuterol Sulfate (2.5 Mg/51ml) 0.083% Nebu (Albuterol Sulfate) .Marland Kitchen.. 1 Vial Every 4-6 Hours As Needed 22)  Prednisone 10 Mg Tabs (Prednisone) .... 4 Tabs For 4 Days, Then 3 Tabs For 4 Days, 2 Tabs For 4 Days, Then 1 Tab For 4 Days, Then Stop 23)  Tussionex Pennkinetic Er 8-10 Mg/61ml Lqcr (Chlorpheniramine-Hydrocodone) .Marland Kitchen.. 1 Tsp Every 12 Hr As Needed Cough, May Make You Sleepy  Allergies (verified): 1)  ! Avelox  Past History:  Past Medical History: Last updated: 07/05/2007 [name] (ICD-695.3) FATTY LIVER DISEASE VIA CT (ICD-571.8) GERD (ICD-530.81) DIABETES MELLITUS, TYPE II (ICD-250.00) POSITIVE PPD (ICD-795.5) INVOLUNTARY MOVEMENTS, ABNORMAL (ICD-781.0) HYPERCHOLESTEROLEMIA / TRIG  (235/374) (ICD-272.0) HYPOTHYROIDISM (ICD-244.9) BRONCHITIS, ACUTE (ICD-466.0) URINARY INCONTINENCE (ICD-788.30) SCLEROSIS, MULTIPLE (ICD-340) ADENOCARCINOMA, BREAST (ICD-174.9) VOCAL CORD DISORDER (ICD-478.5) HYPERTENSION (ICD-401.9) DEPRESSION (ICD-311) CORONARY ARTERY DISEASE, MINIMAL (ICD-414.00) COPD (ICD-496) ASTHMA (ICD-493.90)  Past Surgical History: Last updated: 07/05/2007 Appy 1942 Tonsillectomy 1951 D&C 1964/5 NSVD x 5 BTL 1970 Head CT- chronic  sinusitis 02/99 Enoscopy normal except hiatal hernia 1980's Barium swallow- wnl 1980's MRI Head- sm vess dz, , not type of M.S. 01/99 Sinus Surgery 11/1997 Bone density wnl 01/12/98 [facility] hospitalization 05/15-22/01 Head MRI ([name]) wnl 06/01 Adenosine Cardiolite wnl 07/01 Radiologic renal cyst aspiration , left 06/04/00 CT Pelvis, left renal cyst, right sm renal cyst, fatty liver 04/02 Colonoscopy, polyps 04/02 EGD, H.H. 04/02 CT Pelvis (via Charlann Boxer) left renal cyst 02/04 Holter wnl 07/14/99 Cath, wnl (Dr. Katrinka Blazing) 12/03/02 Colonoscopy 3mm polyp destroyed 10/22/03 Right breast lumpectomy, ductal cancer 01/06 MRI of bilat breast, wnl 03/07/06  Family History: Last updated: Jun 18, 2007 Father: Died at age 42 of coronary disease Mother: Died at age 71 of cancer of the pancreas with diabetes and emphysema in a nonsmoker Siblings: 2 brothers, twins, both with high blood pressure and diabetes, Leukemia. 3 sisters, all deceased, one with nonalcoholic cirrhosis, one with cancer of the pancreas, one with lymphoma CV - none HBP + 2 sisters, 2  brothers DM + sisters, 2 brothers Lymphoma + sister Thyroid cancer + daughter Cancer pancr + mother Liver and pancr cancer + sister Cirrhosis + sister  Social History: Last updated: 10/05/2008 Marital Status: Married Children: 5 Occupation: Housewife, former Engineer, building services.  Lives with her husband at [facility] Daughter is [name], Charity fundraiser (former Engineer, civil (consulting) for [employer]) former smoker - quit in 1986, x26yrs, 1-1.5ppd  Risk Factors: Alcohol Use: 2 (05/31/2007) Caffeine Use: 1 (05/31/2007) Exercise: no (05/31/2007)  Risk Factors: Smoking Status: quit > 6 months (12/17/2008) Passive Smoke Exposure: no (05/31/2007)  Review of Systems      See HPI  Vital Signs:  Patient profile:   77 year old female Height:      64 inches Weight:      247.44 pounds O2 Sat:      95 % on Room air Temp:     96.8 degrees F oral Pulse rate:   99 / minute BP  sitting:   134 / 60  (left arm) Cuff size:   large  Vitals Entered By: Gweneth Dimitri RN (February 22, 2009 3:27 PM)  O2 Flow:  Room air CC: 1 wk follow up. states breathing is a little bit better but still having "extreme SOB with any movement,"  still using nebs 1-2 times/day with little difference after using them, and still fatigued.  cough is minimal now-dry. Is Patient Diabetic? Yes Comments Medications reviewed with patient phone number verified with pt. Gweneth Dimitri RN  February 22, 2009 3:25 PM    Physical Exam  Additional Exam:  GEN: A/Ox3; pleasant , NAD HEENT:  [place]/AT, , EACs-clear, TMs-wnl, NOSE-clear, THROAT-clear NECK:  Supple w/ fair ROM; no JVD; normal carotid impulses w/o bruits; no thyromegaly or nodules palpated; no lymphadenopathy. CHEST: Coarse BS w/ exp wheezing. she has loud psuedowheezing.  HEART:  RRR, no m/r/g ABDOMEN:  Soft & nt; nml bowel sounds; no organomegaly or masses detected, no guarding, or rebound, sl distended EXT: Warm bil,  no calf pain, tr-edema- improved, clubbing, pulses intact     Impression & Recommendations:  Problem # 1:  ASTHMA (ICD-493.90) Slow to resolve exacerbation REC:  Finish Prednsione taper as directed.  Please start these meds:  Mcuinex DM two times a day  Tessalon 200mg  three times a day  Tussionex 1 tsp two times a day as needed severe cough.  Brush. rinse and gargle after inhaler use.  Add pepcid 20mg  at bedtime  Add zyrtec 10mg  at bedtime  Sugarless candy, NO MINTS, to help avoid throat clearing/coughing.  follow up 2  week Dr. Delford Field as scheduled.  Please contact office for sooner follow up if symptoms do not improve or worsen   Problem # 2:  VOCAL CORD DISORDER (ICD-478.5)  cont aggressive trigger prevention with gerd/rhinitis prevention meds.   Orders: Est. Patient Level IV (28413)  Complete Medication List: 1)  Spiriva Handihaler 18 Mcg Caps (Tiotropium bromide monohydrate) .... Inhale contents of 1  capsule once a day 2)  Zetia 10 Mg Tabs (Ezetimibe) .Marland Kitchen.. 1 tablet at bedtime 3)  Aspirin 81 Mg Tbec (Aspirin) .... Take one by mouth once a day 4)  Arimidex 1 Mg Tabs (Anastrozole) .... Take 1 tablet by mouth once a day 5)  Protonix 40 Mg Tbec (Pantoprazole sodium) .... One by mouth two times a day 6)  Metformin Hcl 500 Mg Tb24 (Metformin hcl) .Marland Kitchen.. 1 at bedtime 7)  Cozaar 50 Mg Tabs (Losartan potassium) .... Take 1 tablet by mouth once a  day 8)  Catapres 0.3 Mg Tabs (Clonidine hcl) .... One tab by mouth two times a day 9)  Effexor Xr 75 Mg Cp24 (Venlafaxine hcl) .... Take one by mouth once a day 10)  Calcium Carbonate-vitamin D 600-400 Mg-unit Tabs (Calcium carbonate-vitamin d) .... Take 1 tablet by mouth two times a day 11)  Magnesium 250 Mg Tabs (Magnesium) .... Take 1 tablet by mouth once a day 12)  Multivitamins Tabs (Multiple vitamin) .... Take 1 tablet by mouth once a day 13)  Cyclobenzaprine Hcl 10 Mg Tabs (Cyclobenzaprine hcl) .... Take 1 tablet by mouth two times a day 14)  Tylenol Pm Extra Strength 500-25 Mg Tabs (Diphenhydramine-apap (sleep)) .... Take 1 tab by mouth at bedtime 15)  Synthroid 137 Mcg Tabs (Levothyroxine sodium) .... One by mouth once daily 16)  Miralax Powd (Polyethylene glycol 3350) .Marland Kitchen.. 1 packet daily as needed 17)  Lasix 20 Mg Tabs (Furosemide) .Marland Kitchen.. 1 by mouth once daily as needed edema 18)  Aleve 220 Mg Tabs (Naproxen sodium) .... Per bottle 19)  Vicodin 5-500 Mg Tabs (Hydrocodone-acetaminophen) .... Take 1 tablet by mouth every 4-6 hours as needed 20)  Tessalon 200 Mg Caps (Benzonatate) .Marland Kitchen.. 1 by mouth three times a day as needed cough 21)  Albuterol Sulfate (2.5 Mg/26ml) 0.083% Nebu (Albuterol sulfate) .Marland Kitchen.. 1 vial every 4-6 hours as needed 22)  Prednisone 10 Mg Tabs (Prednisone) .... 4 tabs for 4 days, then 3 tabs for 4 days, 2 tabs for 4 days, then 1 tab for 4 days, then stop 23)  Tussionex Pennkinetic Er 8-10 Mg/58ml Lqcr (Chlorpheniramine-hydrocodone) .Marland Kitchen.. 1  tsp every 12 hr as needed cough, may make you sleepy  Patient Instructions: 1)  Finish Prednsione taper as directed.  2)  Please start these meds:  3)  Mcuinex DM two times a day  4)  Tessalon 200mg  three times a day  5)  Tussionex 1 tsp two times a day as needed severe cough.  6)  Brush. rinse and gargle after inhaler use.  7)  Add pepcid 20mg  at bedtime  8)  Add zyrtec 10mg  at bedtime  9)  Sugarless candy, NO MINTS, to help avoid throat clearing/coughing.  10)  follow up 2  week Dr. Delford Field as scheduled.  11)  Please contact office for sooner follow up if symptoms do not improve or worsen   Appended Document: 1 week/ mbw I agree with this plan of care pw

## 2010-03-10 ENCOUNTER — Ambulatory Visit
Admission: RE | Admit: 2010-03-10 | Discharge: 2010-03-10 | Disposition: A | Discharge status: Home / Self Care | Payer: Self-pay | Source: Ambulatory Visit | Attending: Family Medicine | Admitting: Family Medicine

## 2010-03-10 DIAGNOSIS — Z0000 Encounter for general adult medical examination without abnormal findings: Secondary | ICD-10-CM

## 2010-03-10 NOTE — Assessment & Plan Note (Addendum)
Summary: Pulmonary OV   Copy to:  Joycelyn Man Primary Provider/Referring Provider:  Joycelyn Man  CC:  Acute Visit.  progressive worsening cough, intermittant low grade fever, increased difficulty breathing at times, and wheezing x 2 wks.  Cough is prod - now with tinted yellow mucus..  History of Present Illness: 77  yo WF with hx of AB/COPD, VCD , and MS  April 15, 2009 11:48 AM Worse since end of last week over two week.  Started ?viral illness.  Skin hurts to touch, chills, low grade fever,  cough is productive yellow and green.  Uses tussionex/tessalon three times a day.   Feels congested and chest felt heavy.  Notes foul odor to urine and dark urine with dysuria. CXR in the office reveals PNA bilateral LL.  Pt admitted for inpatient care .  May 06, 2009 11:15 AM  Post hosp ov: was in hosp 3/10 -3/15 bilateral LL PNA on CXR. Occ mucous with cough occ tinted yellow or white.  Dyspnea is back to baseline.  No at bedtime cough.  No special diet.  Will occ choke on food.  Not incontinent at home.   on probiotic now.  now off ceftin and pred.  September 09, 2009 12:01 PM Developed UTI,  went to SNF on site at [facility] for 2.5 months. had foley in ,  took foley out but still got Klebsiella UTI.   Now back home PT three times per week. Now starting periph nerve stim for bladder issue.  [facility] wants a note in their chart.  Foley out for two weeks.   Still incontinent.  Wears depends and pull ups.  Foley two months and sphincter control issues.   ? nerve stim will help. Had some more cough since home.  Dyspnea is the same.  Cough now is productive yellow and green. No chest pain.   No fever.December 09, 2009 12:16 PM Bladder issues slightly better.   Cough is sl worse,  not severe.  No mucus.  Notes more wheezing.  Had flu vaccine already   February 24, 2010 2:02 PM Pt notes more coughing more and wheezing for two weeks.  Low grade fever.  No pndrip.  No chest pain.   More  dyspnea. Pt with sl pseudowheeze    Current Medications (verified): 1)  Spiriva Handihaler 18 Mcg Caps (Tiotropium Bromide Monohydrate) .... Inhale Contents of 1 Capsule Once A Day 2)  Zetia 10 Mg  Tabs (Ezetimibe) .Marland Kitchen.. 1 Tablet At Bedtime 3)  Aspirin 81 Mg  Tbec (Aspirin) .... Take One By Mouth Once A Day 4)  Protonix 40 Mg  Tbec (Pantoprazole Sodium) .... One By Mouth Two Times A Day 5)  Metformin Hcl 500 Mg  Tb24 (Metformin Hcl) .Marland Kitchen.. 1 At Bedtime 6)  Cozaar 50 Mg  Tabs (Losartan Potassium) .... Take 1 Tablet By Mouth Once A Day 7)  Catapres 0.3 Mg Tabs (Clonidine Hcl) .... One Tab By Mouth Two Times A Day 8)  Effexor Xr 75 Mg  Cp24 (Venlafaxine Hcl) .... Take One By Mouth Once A Day 9)  Calcium Carbonate-Vitamin D 600-400 Mg-Unit  Tabs (Calcium Carbonate-Vitamin D) .... Take 1 Tablet By Mouth Two Times A Day 10)  Magnesium 250 Mg Tabs (Magnesium) .... Take 1 Tablet By Mouth Once A Day 11)  Multivitamins   Tabs (Multiple Vitamin) .... Take 1 Tablet By Mouth Once A Day 12)  Cyclobenzaprine Hcl 10 Mg Tabs (Cyclobenzaprine Hcl) .... Take 1 Tablet By Mouth  Two Times A Day 13)  Synthroid 137 Mcg  Tabs (Levothyroxine Sodium) .... One By Mouth Once Daily 14)  Miralax   Powd (Polyethylene Glycol 3350) .Marland Kitchen.. 1 Packet Daily As Needed 15)  Lasix 20 Mg Tabs (Furosemide) .Marland Kitchen.. 1 By Mouth Once Daily As Needed Edema 16)  Aleve 220 Mg Tabs (Naproxen Sodium) .... Take 1 Tab By Mouth At Bedtime and As Needed 17)  Vicodin 5-500 Mg Tabs (Hydrocodone-Acetaminophen) .... Take 1 Tablet By Mouth Every 4-6 Hours As Needed 18)  Tessalon 200 Mg Caps (Benzonatate) .Marland Kitchen.. 1 By Mouth Three Times A Day As Needed 19)  Albuterol Sulfate (2.5 Mg/95ml) 0.083% Nebu (Albuterol Sulfate) .Marland Kitchen.. 1 Vial Every 4-6 Hours As Needed 20)  Tussionex Pennkinetic Er 8-10 Mg/44ml Lqcr (Chlorpheniramine-Hydrocodone) .Marland Kitchen.. 1 Tsp Every 12 Hr As Needed Cough, May Make You Sleepy 21)  Famotidine 20 Mg Tabs (Famotidine) .... Take 1 Tab By Mouth At  Bedtime 22)  Probiotic  Caps (Probiotic Product) .... Take 1 Tablet By Mouth Two Times A Day When Taking Antibiotics 23)  Toviaz 4 Mg Xr24h-Tab (Fesoterodine Fumarate) .Marland Kitchen.. 1 Tab in Am  Allergies (verified): 1)  ! Avelox  Past History:  Past medical, surgical, family and social histories (including risk factors) reviewed, and no changes noted (except as noted below).  Past Medical History: Reviewed history from 12/24/2009 and no changes required. [name] (ICD-695.3) OBESITY FATTY LIVER DISEASE VIA CT (ICD-571.8) GERD and UES STENOSIS (dilated in past) DIABETES MELLITUS, TYPE II (ICD-250.00) POSITIVE PPD (ICD-795.5) INVOLUNTARY MOVEMENTS, ABNORMAL (ICD-781.0) HYPERCHOLESTEROLEMIA / TRIG  (235/374) (ICD-272.0) HYPOTHYROIDISM (ICD-244.9) URINARY INCONTINENCE (ICD-788.30) SCLEROSIS, MULTIPLE (ICD-340) ADENOCARCINOMA, BREAST (ICD-174.9) VOCAL CORD DISORDER (ICD-478.5) HYPERTENSION (ICD-401.9) DEPRESSION (ICD-311) CORONARY ARTERY DISEASE, MINIMAL (ICD-414.00) COPD (ICD-496) ASTHMA (ICD-493.90) Ankle pain per Dr. Dion Saucier COMMUNITY ACQUIRED PNEUMONIA 3/11  Past Surgical History: Reviewed history from 11/11/2009 and no changes required. Appy 1942 Tonsillectomy 1951 D&C 1964/5 NSVD x 5 BTL 1970 Head CT- chronic sinusitis 02/99 Enoscopy normal except hiatal hernia 1980's Barium swallow- wnl 1980's MRI Head- sm vess dz, , not type of M.S. 01/99 Sinus Surgery 11/1997 Bone density wnl 01/12/98 [facility] hospitalization 05/15-22/01 Head MRI ([name]) wnl 06/01 Adenosine Cardiolite wnl 07/01 Radiologic renal cyst aspiration , left 06/04/00 CT Pelvis, left renal cyst, right sm renal cyst, fatty liver 04/02 Colonoscopy, polyps 04/02 EGD, H.H. 04/02 CT Pelvis (via Charlann Boxer) left renal cyst 02/04 Holter wnl 07/14/99 Cath, wnl (Dr. Katrinka Blazing) 12/03/02 Colonoscopy 3mm polyp destroyed 10/22/03 Right breast lumpectomy, ductal cancer 01/06 MRI of bilat breast, wnl 03/07/06 HOSP R Bimalleolar  Ankle Fx/ repair     L Hand 3&4 Metacarpal Fxs    5/16-5/19/2011  Family History: Reviewed history from 05/31/2007 and no changes required. Father: Died at age 46 of coronary disease Mother: Died at age 57 of cancer of the pancreas with diabetes and emphysema in a nonsmoker Siblings: 2 brothers, twins, both with high blood pressure and diabetes, Leukemia. 3 sisters, all deceased, one with nonalcoholic cirrhosis, one with cancer of the pancreas, one with lymphoma CV - none HBP + 2 sisters, 2 brothers DM + sisters, 2 brothers Lymphoma + sister Thyroid cancer + daughter Cancer pancr + mother Liver and pancr cancer + sister Cirrhosis + sister  Social History: Reviewed history from 10/05/2008 and no changes required. Marital Status: Married Children: 5 Occupation: Housewife, former Engineer, building services.  Lives with her husband at [facility] Daughter is [name], Charity fundraiser (former Engineer, civil (consulting) for [employer]) former smoker - quit in 1986, x88yrs, 1-1.5ppd  Review of Systems       The patient complains of shortness of breath with activity, productive cough, and non-productive cough.  The patient denies shortness of breath at rest, coughing up blood, chest pain, irregular heartbeats, acid heartburn, indigestion, loss of appetite, weight change, abdominal pain, difficulty swallowing, sore throat, tooth/dental problems, headaches, nasal congestion/difficulty breathing through nose, sneezing, itching, ear ache, anxiety, depression, hand/feet swelling, joint stiffness or pain, rash, change in color of mucus, and fever.    Vital Signs:  Patient profile:   77 year old female Height:      64 inches Weight:      241 pounds BMI:     41.52 O2 Sat:      99 % on Room air Temp:     98.0 degrees F oral Pulse rate:   84 / minute BP sitting:   160 / 90  (left arm) Cuff size:   large  Vitals Entered By: Gweneth Dimitri RN (February 24, 2010 1:53 PM)  O2 Flow:  Room air CC: Acute Visit.  progressive worsening cough,  intermittant low grade fever, increased difficulty breathing at times, wheezing x 2 wks.  Cough is prod - now with tinted yellow mucus. Comments Medications reviewed with patient Daytime contact number verified with patient. Gweneth Dimitri RN  February 24, 2010 1:53 PM    Physical Exam  Additional Exam:  GEN: A/Ox3; pleasant , NAD HEENT:  [place]/AT, , EACs-clear, TMs-wnl, NOSE-clear, THROAT-clear NECK:  Supple w/ fair ROM; no JVD; normal carotid impulses w/o bruits; no thyromegaly or nodules palpated; no lymphadenopathy. CHEST: Coarse BS w/ improved  psuedowheezing.  HEART:  RRR, no m/r/g ABDOMEN:  Soft & nt; nml bowel sounds; no organomegaly or masses detected, no guarding, or rebound, sl distended EXT: Warm bil,  no calf pain, tr-edema- improved, clubbing, pulses intact     Impression & Recommendations:  Problem # 1:  BRONCHITIS, ACUTE (ICD-466.0) Assessment Deteriorated acute tracheobronchitis with flare plan levaquin x 7days pulse pred   Medications Added to Medication List This Visit: 1)  Aleve 220 Mg Tabs (Naproxen sodium) .... Take 1 tab by mouth at bedtime and as needed 2)  Probiotic Caps (Probiotic product) .... Take 1 tablet by mouth two times a day when taking antibiotics 3)  Levaquin 750 Mg Tabs (Levofloxacin) .... One tablet by mouth daily 4)  Prednisone 10 Mg Tabs (Prednisone) .... Take as directed take 4 daily for two days, then 3 daily for two days, then two daily for two days then one daily for two days then stop  Complete Medication List: 1)  Spiriva Handihaler 18 Mcg Caps (Tiotropium bromide monohydrate) .... Inhale contents of 1 capsule once a day 2)  Zetia 10 Mg Tabs (Ezetimibe) .Marland Kitchen.. 1 tablet at bedtime 3)  Aspirin 81 Mg Tbec (Aspirin) .... Take one by mouth once a day 4)  Protonix 40 Mg Tbec (Pantoprazole sodium) .... One by mouth two times a day 5)  Metformin Hcl 500 Mg Tb24 (Metformin hcl) .Marland Kitchen.. 1 at bedtime 6)  Cozaar 50 Mg Tabs (Losartan potassium) .... Take  1 tablet by mouth once a day 7)  Catapres 0.3 Mg Tabs (Clonidine hcl) .... One tab by mouth two times a day 8)  Effexor Xr 75 Mg Cp24 (Venlafaxine hcl) .... Take one by mouth once a day 9)  Calcium Carbonate-vitamin D 600-400 Mg-unit Tabs (Calcium carbonate-vitamin d) .... Take 1 tablet by mouth two times a day 10)  Magnesium 250 Mg Tabs (Magnesium) .... Take  1 tablet by mouth once a day 11)  Multivitamins Tabs (Multiple vitamin) .... Take 1 tablet by mouth once a day 12)  Cyclobenzaprine Hcl 10 Mg Tabs (Cyclobenzaprine hcl) .... Take 1 tablet by mouth two times a day 13)  Synthroid 137 Mcg Tabs (Levothyroxine sodium) .... One by mouth once daily 14)  Miralax Powd (Polyethylene glycol 3350) .Marland Kitchen.. 1 packet daily as needed 15)  Lasix 20 Mg Tabs (Furosemide) .Marland Kitchen.. 1 by mouth once daily as needed edema 16)  Aleve 220 Mg Tabs (Naproxen sodium) .... Take 1 tab by mouth at bedtime and as needed 17)  Vicodin 5-500 Mg Tabs (Hydrocodone-acetaminophen) .... Take 1 tablet by mouth every 4-6 hours as needed 18)  Tessalon 200 Mg Caps (Benzonatate) .Marland Kitchen.. 1 by mouth three times a day as needed 19)  Albuterol Sulfate (2.5 Mg/21ml) 0.083% Nebu (Albuterol sulfate) .Marland Kitchen.. 1 vial every 4-6 hours as needed 20)  Tussionex Pennkinetic Er 8-10 Mg/34ml Lqcr (Chlorpheniramine-hydrocodone) .Marland Kitchen.. 1 tsp every 12 hr as needed cough, may make you sleepy 21)  Famotidine 20 Mg Tabs (Famotidine) .... Take 1 tab by mouth at bedtime 22)  Probiotic Caps (Probiotic product) .... Take 1 tablet by mouth two times a day when taking antibiotics 23)  Toviaz 4 Mg Xr24h-tab (Fesoterodine fumarate) .Marland Kitchen.. 1 tab in am 24)  Levaquin 750 Mg Tabs (Levofloxacin) .... One tablet by mouth daily 25)  Prednisone 10 Mg Tabs (Prednisone) .... Take as directed take 4 daily for two days, then 3 daily for two days, then two daily for two days then one daily for two days then stop  Other Orders: Est. Patient Level IV (45409)  Patient Instructions: 1)  Prednisone  10mg  Take 4 daily for two days, then 3 daily for two days, then two daily for two days then one daily for two days then stop 2)  Levaquin one daily for 7days 3)  No other medication changes 4)  Return [place] two months Prescriptions: TESSALON 200 MG CAPS (BENZONATATE) 1 by mouth three times a day as needed  #90 x 6   Entered and Authorized by:   Storm Frisk MD   Signed by:   Storm Frisk MD on 02/24/2010   Method used:   Electronically to        [facility] [address]* (retail)       [address] [place], [place]  [address]       Ph: [phone]       Fax: [phone]   RxID:   [id number] PREDNISONE 10 MG  TABS (PREDNISONE) Take as directed Take 4 daily for two days, then 3 daily for two days, then two daily for two days then one daily for two days then stop  #20 x 0   Entered and Authorized by:   Storm Frisk MD   Signed by:   Storm Frisk MD on 02/24/2010   Method used:   Electronically to        [facility] [address]* (retail)       [address] [place], [place]  [address]       Ph: [phone]       Fax: [phone]   RxID:   [id number] LEVAQUIN 750 MG  TABS (LEVOFLOXACIN) One tablet by mouth daily  #7  x 0   Entered and Authorized by:   Storm Frisk MD   Signed by:   Storm Frisk MD on 02/24/2010   Method used:   Electronically to        [facility] [address]* (retail)       [address] [place], [place]  [address]       Ph: [phone]       Fax: [phone]   RxID:   [id number]

## 2010-03-10 NOTE — Procedures (Signed)
Summary: Upper Endoscopy w/DIL  Patient: Ana Nunez Note: All result statuses are Final unless otherwise noted.  Tests: (1) Upper Endoscopy w/DIL (UED)  UED Upper Endoscopy w/DIL                             DONE     [facility]     [address] [place], [place]  [address]           ENDOSCOPY PROCEDURE REPORT           PATIENT:  [name], [name]  MR#:  [id number]     BIRTHDATE:  Aug 05, 1929, 80 yrs. old  GENDER:  female           ENDOSCOPIST:  Iva Boop, MD, [facility]           PROCEDURE DATE:  01/19/2010     PROCEDURE:  EGD, diagnostic, Maloney Dilation of the Esophagus     ASA CLASS:  Class III     INDICATIONS:  1) dysphagia that has responded to maloney dilation     in past with suspected upper esophageal stenosis           MEDICATIONS:   Fentanyl 75 mcg, Versed 7 mg     TOPICAL ANESTHETIC:  Cetacaine Spray           DESCRIPTION OF PROCEDURE:   After the risks benefits and     alternatives of the procedure were thoroughly explained, informed     consent was obtained.  The  endoscope was introduced through the     mouth and advanced to the second portion of the duodenum, without     limitations.  The instrument was slowly withdrawn as the mucosa     was carefully examined.     <<PROCEDUREIMAGES>>           Stenosis in the proximal esophagus. (Upper esophageal sphincter     seemed stenotic.)  Polyps in the fundus and body of the stomach.     Multiple subcentimeter polyps known to be fundic gland polyps.     The examination was otherwise normal.    Dilation was then     performed at the proximal esophagus           1) Dilator:  Elease Hashimoto  Size(s):  54 French     Resistance:  minimal  Heme:  yes     Appearance:  satisfactory     Reinspection showed heme to be from pharynx due to initial     positioning of Maloney. Dilation effect with tear was seen in the     upper esophageal sphincter.           COMPLICATIONS:  None           ENDOSCOPIC IMPRESSION:     1)  Stenosis in the upper esophageal sphincter dilated to 54     French     2) Fundic gland polyps (previously proven)  in the body of the     stomach     3) Otherwise normal examination.     RECOMMENDATIONS:     Clear liquids only until 130 PM then soft foods today.     Normal foods (for patient) tomorrow.           REPEAT EXAM:  In for as needed.           Iva Boop, MD, [name]  CC:  The Patient           n.     eSIGNED:   Iva Boop at 01/19/2010 12:10 PM           Alger Memos, [id number]  Note: An exclamation mark (!) indicates a result that was not dispersed into the flowsheet. Document Creation Date: 01/19/2010 12:11 PM _______________________________________________________________________  (1) Order result status: Final Collection or observation date-time: 01/19/2010 12:01 Requested date-time:  Receipt date-time:  Reported date-time:  Referring Physician:   Ordering Physician: Stan Head [phone]) Specimen Source:  Source: Launa Grill Order Number: [id number] Lab site:

## 2010-03-31 ENCOUNTER — Encounter: Payer: Self-pay | Admitting: Critical Care Medicine

## 2010-03-31 ENCOUNTER — Other Ambulatory Visit: Payer: Self-pay | Admitting: Critical Care Medicine

## 2010-03-31 ENCOUNTER — Ambulatory Visit (HOSPITAL_BASED_OUTPATIENT_CLINIC_OR_DEPARTMENT_OTHER)
Admission: RE | Admit: 2010-03-31 | Discharge: 2010-03-31 | Disposition: A | Discharge status: Home / Self Care | Payer: Medicare Other | Source: Ambulatory Visit | Attending: Critical Care Medicine | Admitting: Critical Care Medicine

## 2010-03-31 ENCOUNTER — Ambulatory Visit (INDEPENDENT_AMBULATORY_CARE_PROVIDER_SITE_OTHER): Payer: Medicare Other | Admitting: Critical Care Medicine

## 2010-03-31 DIAGNOSIS — J209 Acute bronchitis, unspecified: Secondary | ICD-10-CM

## 2010-03-31 DIAGNOSIS — R05 Cough: Secondary | ICD-10-CM | POA: Insufficient documentation

## 2010-03-31 DIAGNOSIS — J449 Chronic obstructive pulmonary disease, unspecified: Secondary | ICD-10-CM

## 2010-03-31 DIAGNOSIS — R059 Cough, unspecified: Secondary | ICD-10-CM | POA: Insufficient documentation

## 2010-03-31 NOTE — Progress Notes (Unsigned)
  Subjective:    Patient ID: Ana Nunez, female    DOB: October 22, 1929, 77 y.o.   MRN: [id number]  HPI    Review of Systems     Objective:   Physical Exam        Assessment & Plan:

## 2010-04-05 NOTE — Assessment & Plan Note (Signed)
Summary: Pulmonary OV   Copy to:  Joycelyn Man Primary Provider/Referring Provider:  Joycelyn Man  CC:  Acute Visit.  prod cough with yellow/gray/green mucus - worse at night, intermitant fever, wheezing, increased SOB, chest tightness - onset x 1 wk.  Using mucinex dm, tessalon pearles, and tussionex qhs..  History of Present Illness: 77  yo WF with hx of AB/COPD, VCD , and MS  April 15, 2009 11:48 AM Worse since end of last week over two week.  Started ?viral illness.  Skin hurts to touch, chills, low grade fever,  cough is productive yellow and green.  Uses tussionex/tessalon three times a day.   Feels congested and chest felt heavy.  Notes foul odor to urine and dark urine with dysuria. CXR in the office reveals PNA bilateral LL.  Pt admitted for inpatient care .  May 06, 2009 11:15 AM  Post hosp ov: was in hosp 3/10 -3/15 bilateral LL PNA on CXR. Occ mucous with cough occ tinted yellow or white.  Dyspnea is back to baseline.  No at bedtime cough.  No special diet.  Will occ choke on food.  Not incontinent at home.   on probiotic now.  now off ceftin and pred.  September 09, 2009 12:01 PM Developed UTI,  went to SNF on site at [facility] for 2.5 months. had foley in ,  took foley out but still got Klebsiella UTI.   Now back home PT three times per week. Now starting periph nerve stim for bladder issue.  [facility] wants a note in their chart.  Foley out for two weeks.   Still incontinent.  Wears depends and pull ups.  Foley two months and sphincter control issues.   ? nerve stim will help. Had some more cough since home.  Dyspnea is the same.  Cough now is productive yellow and green. No chest pain.   No fever.December 09, 2009 12:16 PM Bladder issues slightly better.   Cough is sl worse,  not severe.  No mucus.  Notes more wheezing.  Had flu vaccine already   February 24, 2010 2:02 PM Pt notes more coughing more and wheezing for two weeks.  Low grade fever.  No pndrip.  No  chest pain.   More dyspnea. Pt with sl pseudowheeze    March 31, 2010 9:38 AM Got better then worse.  Cough is productive, Notes some fever.  Notes more dyspnea.   Mucus yellow to gray green   CXR  Procedure date:  03/31/2010  Findings:      IMPRESSION: No evidence of acute cardiopulmonary disease.   Current Medications (verified): 1)  Spiriva Handihaler 18 Mcg Caps (Tiotropium Bromide Monohydrate) .... Inhale Contents of 1 Capsule Once A Day 2)  Zetia 10 Mg  Tabs (Ezetimibe) .Marland Kitchen.. 1 Tablet At Bedtime 3)  Aspirin 81 Mg  Tbec (Aspirin) .... Take One By Mouth Once A Day 4)  Protonix 40 Mg  Tbec (Pantoprazole Sodium) .... One By Mouth Two Times A Day 5)  Metformin Hcl 500 Mg  Tb24 (Metformin Hcl) .Marland Kitchen.. 1 At Bedtime 6)  Cozaar 50 Mg  Tabs (Losartan Potassium) .... Take 1 Tablet By Mouth Once A Day 7)  Catapres 0.3 Mg Tabs (Clonidine Hcl) .... One Tab By Mouth Two Times A Day 8)  Effexor Xr 75 Mg  Cp24 (Venlafaxine Hcl) .... Take One By Mouth Once A Day 9)  Calcium Carbonate-Vitamin D 600-400 Mg-Unit  Tabs (Calcium Carbonate-Vitamin D) .... Take 1 Tablet  By Mouth Two Times A Day 10)  Magnesium 250 Mg Tabs (Magnesium) .... Take 1 Tablet By Mouth Once A Day 11)  Multivitamins   Tabs (Multiple Vitamin) .... Take 1 Tablet By Mouth Once A Day 12)  Cyclobenzaprine Hcl 10 Mg Tabs (Cyclobenzaprine Hcl) .... Take 1 Tablet By Mouth Two Times A Day 13)  Synthroid 137 Mcg  Tabs (Levothyroxine Sodium) .... One By Mouth Once Daily 14)  Miralax   Powd (Polyethylene Glycol 3350) .Marland Kitchen.. 1 Packet Daily As Needed 15)  Lasix 20 Mg Tabs (Furosemide) .Marland Kitchen.. 1 By Mouth Once Daily As Needed Edema 16)  Aleve 220 Mg Tabs (Naproxen Sodium) .... Take 1 Tab By Mouth At Bedtime and As Needed 17)  Vicodin 5-500 Mg Tabs (Hydrocodone-Acetaminophen) .... Take 1 Tablet By Mouth Every 4-6 Hours As Needed 18)  Tessalon 200 Mg Caps (Benzonatate) .Marland Kitchen.. 1 By Mouth Three Times A Day As Needed 19)  Albuterol Sulfate (2.5 Mg/48ml)  0.083% Nebu (Albuterol Sulfate) .Marland Kitchen.. 1 Vial Every 4-6 Hours As Needed 20)  Tussionex Pennkinetic Er 8-10 Mg/74ml Lqcr (Chlorpheniramine-Hydrocodone) .Marland Kitchen.. 1 Tsp Every 12 Hr As Needed Cough, May Make You Sleepy 21)  Famotidine 20 Mg Tabs (Famotidine) .... Take 1 Tab By Mouth At Bedtime 22)  Probiotic  Caps (Probiotic Product) .... Take 1 Tablet By Mouth Two Times A Day When Taking Antibiotics 23)  Toviaz 4 Mg Xr24h-Tab (Fesoterodine Fumarate) .Marland Kitchen.. 1 Tab in Am 24)  Mucinex Dm 30-600 Mg Xr12h-Tab (Dextromethorphan-Guaifenesin) .... Take 1 Tablet By Mouth Two Times A Day  Allergies (verified): 1)  ! Avelox  Past History:  Past medical, surgical, family and social histories (including risk factors) reviewed, and no changes noted (except as noted below).  Past Medical History: Reviewed history from 12/24/2009 and no changes required. [name] (ICD-695.3) OBESITY FATTY LIVER DISEASE VIA CT (ICD-571.8) GERD and UES STENOSIS (dilated in past) DIABETES MELLITUS, TYPE II (ICD-250.00) POSITIVE PPD (ICD-795.5) INVOLUNTARY MOVEMENTS, ABNORMAL (ICD-781.0) HYPERCHOLESTEROLEMIA / TRIG  (235/374) (ICD-272.0) HYPOTHYROIDISM (ICD-244.9) URINARY INCONTINENCE (ICD-788.30) SCLEROSIS, MULTIPLE (ICD-340) ADENOCARCINOMA, BREAST (ICD-174.9) VOCAL CORD DISORDER (ICD-478.5) HYPERTENSION (ICD-401.9) DEPRESSION (ICD-311) CORONARY ARTERY DISEASE, MINIMAL (ICD-414.00) COPD (ICD-496) ASTHMA (ICD-493.90) Ankle pain per Dr. Dion Saucier COMMUNITY ACQUIRED PNEUMONIA 3/11  Past Surgical History: Reviewed history from 11/11/2009 and no changes required. Appy 1942 Tonsillectomy 1951 D&C 1964/5 NSVD x 5 BTL 1970 Head CT- chronic sinusitis 02/99 Enoscopy normal except hiatal hernia 1980's Barium swallow- wnl 1980's MRI Head- sm vess dz, , not type of M.S. 01/99 Sinus Surgery 11/1997 Bone density wnl 01/12/98 [facility] hospitalization 05/15-22/01 Head MRI ([name]) wnl 06/01 Adenosine Cardiolite wnl 07/01 Radiologic  renal cyst aspiration , left 06/04/00 CT Pelvis, left renal cyst, right sm renal cyst, fatty liver 04/02 Colonoscopy, polyps 04/02 EGD, H.H. 04/02 CT Pelvis (via Charlann Boxer) left renal cyst 02/04 Holter wnl 07/14/99 Cath, wnl (Dr. Katrinka Blazing) 12/03/02 Colonoscopy 3mm polyp destroyed 10/22/03 Right breast lumpectomy, ductal cancer 01/06 MRI of bilat breast, wnl 03/07/06 HOSP R Bimalleolar Ankle Fx/ repair     L Hand 3&4 Metacarpal Fxs    5/16-5/19/2011  Family History: Reviewed history from 05/31/2007 and no changes required. Father: Died at age 83 of coronary disease Mother: Died at age 86 of cancer of the pancreas with diabetes and emphysema in a nonsmoker Siblings: 2 brothers, twins, both with high blood pressure and diabetes, Leukemia. 3 sisters, all deceased, one with nonalcoholic cirrhosis, one with cancer of the pancreas, one with lymphoma CV - none HBP + 2 sisters, 2 brothers DM +  sisters, 2 brothers Lymphoma + sister Thyroid cancer + daughter Cancer pancr + mother Liver and pancr cancer + sister Cirrhosis + sister  Social History: Reviewed history from 10/05/2008 and no changes required. Marital Status: Married Children: 5 Occupation: Housewife, former Engineer, building services.  Lives with her husband at [facility] Daughter is [name], Charity fundraiser (former Engineer, civil (consulting) for [employer]) former smoker - quit in 1986, x18yrs, 1-1.5ppd  Review of Systems       The patient complains of shortness of breath with activity, shortness of breath at rest, productive cough, non-productive cough, nasal congestion/difficulty breathing through nose, and change in color of mucus.  The patient denies coughing up blood, chest pain, irregular heartbeats, acid heartburn, indigestion, loss of appetite, weight change, abdominal pain, difficulty swallowing, sore throat, tooth/dental problems, headaches, sneezing, itching, ear ache, anxiety, depression, hand/feet swelling, joint stiffness or pain, rash, and fever.    Vital  Signs:  Patient profile:   77 year old female Height:      64 inches Weight:      243 pounds BMI:     41.86 O2 Sat:      98 % on Room air Temp:     97.8 degrees F oral Pulse rate:   102 / minute BP sitting:   140 / 78  (left arm) Cuff size:   large  Vitals Entered By: Gweneth Dimitri RN (March 31, 2010 9:15 AM)  O2 Flow:  Room air CC: Acute Visit.  prod cough with yellow/gray/green mucus - worse at night, intermitant fever, wheezing, increased SOB, chest tightness - onset x 1 wk.  Using mucinex dm, tessalon pearles, tussionex qhs. Comments Medications reviewed with patient Daytime contact number verified with patient. Gweneth Dimitri RN  March 31, 2010 9:18 AM    Physical Exam  Additional Exam:  GEN: A/Ox3; pleasant , NAD HEENT:  [place]/AT, , EACs-clear, TMs-wnl, NOSE-clear, THROAT-clear NECK:  Supple w/ fair ROM; no JVD; normal carotid impulses w/o bruits; no thyromegaly or nodules palpated; no lymphadenopathy. CHEST: Coarse BS w/ improved  psuedowheezing.  HEART:  RRR, no m/r/g ABDOMEN:  Soft & nt; nml bowel sounds; no organomegaly or masses detected, no guarding, or rebound, sl distended EXT: Warm bil,  no calf pain, tr-edema- improved, clubbing, pulses intact     Impression & Recommendations:  Problem # 1:  ACUTE BRONCHITIS (ICD-466.0) Assessment Deteriorated recurrent tracheobronchitis with flare.  no pna on cxr  plan Levaquin one daily for 7days Prednisone 10mg  Take 4 daily for two days, then 3 daily for two days, then two daily for two days then one daily for two days then stop No other medication changes Return [place] office one month Her updated medication list for this problem includes:    Spiriva Handihaler 18 Mcg Caps (Tiotropium bromide monohydrate) ..... Inhale contents of 1 capsule once a day    Benzonatate 200 Mg Caps (Benzonatate) .Marland Kitchen... Take one every 4 hours as needed cough    Albuterol Sulfate (2.5 Mg/80ml) 0.083% Nebu (Albuterol sulfate) .Marland Kitchen... 1  vial every 4-6 hours as needed    Tussionex Pennkinetic Er 8-10 Mg/89ml Lqcr (Chlorpheniramine-hydrocodone) .Marland Kitchen... 1 tsp every 12 hr as needed cough, may make you sleepy    Mucinex Dm 30-600 Mg Xr12h-tab (Dextromethorphan-guaifenesin) .Marland Kitchen... Take 1 tablet by mouth two times a day    Levaquin 750 Mg Tabs (Levofloxacin) ..... One tablet by mouth daily  Orders: T-2 View CXR (71020TC) Est. Patient Level IV (16109)  Medications Added to Medication List This Visit:  1)  Benzonatate 200 Mg Caps (Benzonatate) .... Take one every 4 hours as needed cough 2)  Mucinex Dm 30-600 Mg Xr12h-tab (Dextromethorphan-guaifenesin) .... Take 1 tablet by mouth two times a day 3)  Prednisone 10 Mg Tabs (Prednisone) .... Take as directed take 4 daily for two days, then 3 daily for two days, then two daily for two days then one daily for two days then stop 4)  Levaquin 750 Mg Tabs (Levofloxacin) .... One tablet by mouth daily  Complete Medication List: 1)  Spiriva Handihaler 18 Mcg Caps (Tiotropium bromide monohydrate) .... Inhale contents of 1 capsule once a day 2)  Zetia 10 Mg Tabs (Ezetimibe) .Marland Kitchen.. 1 tablet at bedtime 3)  Aspirin 81 Mg Tbec (Aspirin) .... Take one by mouth once a day 4)  Protonix 40 Mg Tbec (Pantoprazole sodium) .... One by mouth two times a day 5)  Metformin Hcl 500 Mg Tb24 (Metformin hcl) .Marland Kitchen.. 1 at bedtime 6)  Cozaar 50 Mg Tabs (Losartan potassium) .... Take 1 tablet by mouth once a day 7)  Catapres 0.3 Mg Tabs (Clonidine hcl) .... One tab by mouth two times a day 8)  Effexor Xr 75 Mg Cp24 (Venlafaxine hcl) .... Take one by mouth once a day 9)  Calcium Carbonate-vitamin D 600-400 Mg-unit Tabs (Calcium carbonate-vitamin d) .... Take 1 tablet by mouth two times a day 10)  Magnesium 250 Mg Tabs (Magnesium) .... Take 1 tablet by mouth once a day 11)  Multivitamins Tabs (Multiple vitamin) .... Take 1 tablet by mouth once a day 12)  Cyclobenzaprine Hcl 10 Mg Tabs (Cyclobenzaprine hcl) .... Take 1 tablet  by mouth two times a day 13)  Synthroid 137 Mcg Tabs (Levothyroxine sodium) .... One by mouth once daily 14)  Miralax Powd (Polyethylene glycol 3350) .Marland Kitchen.. 1 packet daily as needed 15)  Lasix 20 Mg Tabs (Furosemide) .Marland Kitchen.. 1 by mouth once daily as needed edema 16)  Aleve 220 Mg Tabs (Naproxen sodium) .... Take 1 tab by mouth at bedtime and as needed 17)  Vicodin 5-500 Mg Tabs (Hydrocodone-acetaminophen) .... Take 1 tablet by mouth every 4-6 hours as needed 18)  Benzonatate 200 Mg Caps (Benzonatate) .... Take one every 4 hours as needed cough 19)  Albuterol Sulfate (2.5 Mg/60ml) 0.083% Nebu (Albuterol sulfate) .Marland Kitchen.. 1 vial every 4-6 hours as needed 20)  Tussionex Pennkinetic Er 8-10 Mg/57ml Lqcr (Chlorpheniramine-hydrocodone) .Marland Kitchen.. 1 tsp every 12 hr as needed cough, may make you sleepy 21)  Famotidine 20 Mg Tabs (Famotidine) .... Take 1 tab by mouth at bedtime 22)  Probiotic Caps (Probiotic product) .... Take 1 tablet by mouth two times a day when taking antibiotics 23)  Toviaz 4 Mg Xr24h-tab (Fesoterodine fumarate) .Marland Kitchen.. 1 tab in am 24)  Mucinex Dm 30-600 Mg Xr12h-tab (Dextromethorphan-guaifenesin) .... Take 1 tablet by mouth two times a day 25)  Prednisone 10 Mg Tabs (Prednisone) .... Take as directed take 4 daily for two days, then 3 daily for two days, then two daily for two days then one daily for two days then stop 26)  Levaquin 750 Mg Tabs (Levofloxacin) .... One tablet by mouth daily  Patient Instructions: 1)  Levaquin one daily for 7days 2)  Prednisone 10mg  Take 4 daily for two days, then 3 daily for two days, then two daily for two days then one daily for two days then stop 3)  No other medication changes 4)  Return [place] office one month Prescriptions: BENZONATATE 200 MG CAPS (BENZONATATE) Take one every  4 hours as needed cough  #90 x 6   Entered and Authorized by:   Storm Frisk MD   Signed by:   Storm Frisk MD on 03/31/2010   Method used:   Electronically to        [facility] [address] (retail)       [address] [place], [place]  [address]       Ph: [phone]       Fax: [phone]   RxID:   [id number] LEVAQUIN 750 MG  TABS (LEVOFLOXACIN) One tablet by mouth daily  #7 x 0   Entered and Authorized by:   Storm Frisk MD   Signed by:   Storm Frisk MD on 03/31/2010   Method used:   Electronically to        [facility] [address] (retail)       [address] [place], [place]  [address]       Ph: [phone]       Fax: [phone]   RxID:   [id number] PREDNISONE 10 MG  TABS (PREDNISONE) Take as directed Take 4 daily for two days, then 3 daily for two days, then two daily for two days then one daily for two days then stop  #20 x 0   Entered and Authorized by:   Storm Frisk MD   Signed by:   Storm Frisk MD on 03/31/2010   Method used:   Electronically to        [facility] [address] (retail)       [address] [place], [place]  [address]       Ph: [phone]       Fax: [phone]   RxID:   [id number]

## 2010-04-21 ENCOUNTER — Ambulatory Visit: Payer: Self-pay | Admitting: Critical Care Medicine

## 2010-04-25 LAB — GLUCOSE, CAPILLARY
Glucose-Capillary: 110 mg/dL — ABNORMAL HIGH (ref 70–99)
Glucose-Capillary: 119 mg/dL — ABNORMAL HIGH (ref 70–99)
Glucose-Capillary: 152 mg/dL — ABNORMAL HIGH (ref 70–99)
Glucose-Capillary: 159 mg/dL — ABNORMAL HIGH (ref 70–99)
Glucose-Capillary: 165 mg/dL — ABNORMAL HIGH (ref 70–99)
Glucose-Capillary: 183 mg/dL — ABNORMAL HIGH (ref 70–99)
Glucose-Capillary: 218 mg/dL — ABNORMAL HIGH (ref 70–99)

## 2010-04-25 LAB — PROTIME-INR
INR: 1.13 (ref 0.00–1.49)
Prothrombin Time: 14.4 seconds (ref 11.6–15.2)
Prothrombin Time: 16.1 seconds — ABNORMAL HIGH (ref 11.6–15.2)

## 2010-04-25 LAB — BASIC METABOLIC PANEL
BUN: 19 mg/dL (ref 6–23)
CO2: 27 mEq/L (ref 19–32)
CO2: 27 mEq/L (ref 19–32)
Calcium: 8.9 mg/dL (ref 8.4–10.5)
Chloride: 103 mEq/L (ref 96–112)
Creatinine, Ser: 0.95 mg/dL (ref 0.4–1.2)
Creatinine, Ser: 1.09 mg/dL (ref 0.4–1.2)
GFR calc Af Amer: 59 mL/min — ABNORMAL LOW (ref >60)
GFR calc Af Amer: >60 mL/min (ref >60)
Potassium: 4.2 mEq/L (ref 3.5–5.1)
Sodium: 137 mEq/L (ref 135–145)

## 2010-04-25 LAB — DIFFERENTIAL
Eosinophils Absolute: 0.9 10*3/uL — ABNORMAL HIGH (ref 0.0–0.7)
Eosinophils Relative: 11 % — ABNORMAL HIGH (ref 0–5)
Lymphs Abs: 1.6 10*3/uL (ref 0.7–4.0)
Monocytes Absolute: 0.7 10*3/uL (ref 0.1–1.0)
Monocytes Relative: 9 % (ref 3–12)

## 2010-04-25 LAB — URINALYSIS, ROUTINE W REFLEX MICROSCOPIC
Glucose, UA: NEGATIVE mg/dL
Leukocytes, UA: NEGATIVE
Specific Gravity, Urine: 1.01 (ref 1.005–1.030)
pH: 5.5 (ref 5.0–8.0)

## 2010-04-25 LAB — CBC
HCT: 34.6 % — ABNORMAL LOW (ref 36.0–46.0)
Hemoglobin: 11.8 g/dL — ABNORMAL LOW (ref 12.0–15.0)
MCHC: 34.1 g/dL (ref 30.0–36.0)
MCHC: 34.7 g/dL (ref 30.0–36.0)
MCHC: 34.8 g/dL (ref 30.0–36.0)
MCV: 101.7 fL — ABNORMAL HIGH (ref 78.0–100.0)
MCV: 102.6 fL — ABNORMAL HIGH (ref 78.0–100.0)
MCV: 102.7 fL — ABNORMAL HIGH (ref 78.0–100.0)
Platelets: 224 10*3/uL (ref 150–400)
Platelets: 233 10*3/uL (ref 150–400)
RBC: 3.4 MIL/uL — ABNORMAL LOW (ref 3.87–5.11)
RBC: 3.56 MIL/uL — ABNORMAL LOW (ref 3.87–5.11)
WBC: 6.3 10*3/uL (ref 4.0–10.5)
WBC: 8.1 10*3/uL (ref 4.0–10.5)
WBC: 9.1 10*3/uL (ref 4.0–10.5)

## 2010-04-25 LAB — COMPREHENSIVE METABOLIC PANEL
AST: 32 U/L (ref 0–37)
Albumin: 3.4 g/dL — ABNORMAL LOW (ref 3.5–5.2)
Calcium: 9 mg/dL (ref 8.4–10.5)
Creatinine, Ser: 1.22 mg/dL — ABNORMAL HIGH (ref 0.4–1.2)
GFR calc Af Amer: 52 mL/min — ABNORMAL LOW (ref >60)
Total Protein: 6.6 g/dL (ref 6.0–8.3)

## 2010-04-25 LAB — URINE MICROSCOPIC-ADD ON

## 2010-04-29 LAB — BASIC METABOLIC PANEL
BUN: 14 mg/dL (ref 6–23)
BUN: 15 mg/dL (ref 6–23)
BUN: 15 mg/dL (ref 6–23)
BUN: 20 mg/dL (ref 6–23)
CO2: 28 mEq/L (ref 19–32)
Calcium: 8.8 mg/dL (ref 8.4–10.5)
Chloride: 101 mEq/L (ref 96–112)
Chloride: 102 mEq/L (ref 96–112)
Creatinine, Ser: 0.93 mg/dL (ref 0.4–1.2)
Creatinine, Ser: 1.09 mg/dL (ref 0.4–1.2)
GFR calc Af Amer: 58 mL/min — ABNORMAL LOW (ref >60)
GFR calc non Af Amer: 48 mL/min — ABNORMAL LOW (ref >60)
GFR calc non Af Amer: 49 mL/min — ABNORMAL LOW (ref >60)
GFR calc non Af Amer: 58 mL/min — ABNORMAL LOW (ref >60)
Glucose, Bld: 128 mg/dL — ABNORMAL HIGH (ref 70–99)
Glucose, Bld: 137 mg/dL — ABNORMAL HIGH (ref 70–99)
Glucose, Bld: 141 mg/dL — ABNORMAL HIGH (ref 70–99)
Potassium: 4.1 mEq/L (ref 3.5–5.1)
Potassium: 4.9 mEq/L (ref 3.5–5.1)
Potassium: 5 mEq/L (ref 3.5–5.1)
Potassium: 5.7 mEq/L — ABNORMAL HIGH (ref 3.5–5.1)
Sodium: 135 mEq/L (ref 135–145)

## 2010-04-29 LAB — COMPREHENSIVE METABOLIC PANEL
ALT: 34 U/L (ref 0–35)
AST: 34 U/L (ref 0–37)
Albumin: 3.3 g/dL — ABNORMAL LOW (ref 3.5–5.2)
CO2: 24 mEq/L (ref 19–32)
Calcium: 8.3 mg/dL — ABNORMAL LOW (ref 8.4–10.5)
GFR calc Af Amer: 50 mL/min — ABNORMAL LOW (ref >60)
GFR calc non Af Amer: 42 mL/min — ABNORMAL LOW (ref >60)
Sodium: 128 mEq/L — ABNORMAL LOW (ref 135–145)
Total Protein: 6.4 g/dL (ref 6.0–8.3)

## 2010-04-29 LAB — CULTURE, BLOOD (ROUTINE X 2): Culture: NO GROWTH

## 2010-04-29 LAB — CBC
HCT: 34.3 % — ABNORMAL LOW (ref 36.0–46.0)
HCT: 34.4 % — ABNORMAL LOW (ref 36.0–46.0)
HCT: 35.4 % — ABNORMAL LOW (ref 36.0–46.0)
HCT: 36 % (ref 36.0–46.0)
HCT: 38.5 % (ref 36.0–46.0)
Hemoglobin: 11.6 g/dL — ABNORMAL LOW (ref 12.0–15.0)
MCHC: 33.8 g/dL (ref 30.0–36.0)
MCV: 101.6 fL — ABNORMAL HIGH (ref 78.0–100.0)
MCV: 102.1 fL — ABNORMAL HIGH (ref 78.0–100.0)
Platelets: 175 10*3/uL (ref 150–400)
Platelets: 196 10*3/uL (ref 150–400)
Platelets: 219 10*3/uL (ref 150–400)
Platelets: 237 10*3/uL (ref 150–400)
Platelets: 341 10*3/uL (ref 150–400)
RBC: 3.79 MIL/uL — ABNORMAL LOW (ref 3.87–5.11)
RDW: 14.5 % (ref 11.5–15.5)
RDW: 14.9 % (ref 11.5–15.5)
RDW: 14.9 % (ref 11.5–15.5)
RDW: 15.2 % (ref 11.5–15.5)
WBC: 10.5 10*3/uL (ref 4.0–10.5)
WBC: 10.9 10*3/uL — ABNORMAL HIGH (ref 4.0–10.5)
WBC: 7.5 10*3/uL (ref 4.0–10.5)

## 2010-04-29 LAB — URINALYSIS, ROUTINE W REFLEX MICROSCOPIC
Glucose, UA: NEGATIVE mg/dL
Hgb urine dipstick: NEGATIVE
Protein, ur: NEGATIVE mg/dL
Urobilinogen, UA: 1 mg/dL (ref 0.0–1.0)

## 2010-04-29 LAB — URINE CULTURE
Colony Count: NO GROWTH
Culture: NO GROWTH

## 2010-04-29 LAB — LEGIONELLA ANTIGEN, URINE: Legionella Antigen, Urine: NEGATIVE

## 2010-04-29 LAB — OSMOLALITY: Osmolality: 280 mOsm/kg (ref 275–300)

## 2010-04-29 LAB — URINE MICROSCOPIC-ADD ON

## 2010-04-29 LAB — STREP PNEUMONIAE URINARY ANTIGEN: Strep Pneumo Urinary Antigen: NEGATIVE

## 2010-04-29 LAB — APTT: aPTT: 32 seconds (ref 24–37)

## 2010-04-29 LAB — SODIUM, URINE, RANDOM: Sodium, Ur: 53 mEq/L

## 2010-04-29 LAB — OSMOLALITY, URINE: Osmolality, Ur: 362 mOsm/kg — ABNORMAL LOW (ref 390–1090)

## 2010-04-29 LAB — CARDIAC PANEL(CRET KIN+CKTOT+MB+TROPI): Troponin I: <0.01 ng/mL (ref 0.00–0.06)

## 2010-05-16 ENCOUNTER — Other Ambulatory Visit: Payer: Self-pay | Admitting: *Deleted

## 2010-05-16 MED ORDER — LOSARTAN POTASSIUM 50 MG PO TABS: 50.0000 mg | ORAL_TABLET | Freq: Every day | ORAL | Status: DC

## 2010-05-18 ENCOUNTER — Encounter: Payer: Self-pay | Admitting: Critical Care Medicine

## 2010-05-19 ENCOUNTER — Ambulatory Visit (INDEPENDENT_AMBULATORY_CARE_PROVIDER_SITE_OTHER): Payer: Medicare Other | Admitting: Critical Care Medicine

## 2010-05-19 ENCOUNTER — Encounter: Payer: Self-pay | Admitting: Critical Care Medicine

## 2010-05-19 VITALS — BP 144/80 | HR 80 | Ht 64.0 in | Wt 238.0 lb

## 2010-05-19 DIAGNOSIS — J449 Chronic obstructive pulmonary disease, unspecified: Secondary | ICD-10-CM

## 2010-05-19 DIAGNOSIS — G35 Multiple sclerosis: Secondary | ICD-10-CM

## 2010-05-19 NOTE — Patient Instructions (Signed)
We will try to get you in with Dr Greer Pickerel al. No change in medications. Return in     3 months

## 2010-05-19 NOTE — Progress Notes (Signed)
Subjective:    Patient ID: Ana Nunez, female    DOB: Nov 19, 1929, 77 y.o.   MRN: [id number]  HPI :  77 yo WF with hx of AB/COPD, VCD , and MS  March 31, 2010 9:38 AM  Got better then worse. Cough is productive, Notes some fever. Notes more dyspnea. Mucus yellow to gray green   05/19/2010 At last ov 2/12 we rec pred and levaquin Pt did get better then worse tan mucus not always productive and doe. Cough seems worse three weeks.    Past Medical History  Diagnosis Date  . [name]   . Fatty liver   . GERD (gastroesophageal reflux disease)   . Type II or unspecified type diabetes mellitus without mention of complication, not stated as uncontrolled   . Positive PPD   . Abnormal involuntary movements   . Hypercholesteremia   . Hypothyroidism   . Unspecified urinary incontinence   . Adenocarcinoma, breast   . Vocal cord dysfunction   . Multiple sclerosis   . Hypertension   . Depression   . CAD (coronary artery disease)   . COPD (chronic obstructive pulmonary disease)   . Asthma   . Community acquired pneumonia      Family History  Problem Relation Age of Onset  . Coronary artery disease Father   . Pancreatic cancer Mother   . Emphysema Mother   . Diabetes Mother   . Pancreatic cancer Sister   . Lymphoma Sister   . Diabetes Sister   . Thyroid cancer Daughter   . Hypertension Sister   . Hypertension Brother   . Liver cancer Sister   . Cirrhosis Sister      History   Social History  . Marital Status: Married    Spouse Name: N/A    Number of Children: N/A  . Years of Education: N/A   Occupational History  . Housewife     former nurse   Social History Main Topics  . Smoking status: Former Smoker -- 1.5 packs/day for 25 years    Types: Cigarettes    Quit date: 02/07/1984  . Smokeless tobacco: Never Used  . Alcohol Use: Yes     1-2 glaases of wine daily  . Drug Use: No  . Sexually Active: Not on file   Other Topics Concern  . Not on file   Social  History Narrative  . No narrative on file     Allergies  Allergen Reactions  . Moxifloxacin     REACTION: rash, pruritis     Outpatient Prescriptions Prior to Visit  Medication Sig Dispense Refill  . albuterol (PROVENTIL) (2.5 MG/3ML) 0.083% nebulizer solution Take 2.5 mg by nebulization every 4 (four) hours as needed.        Marland Kitchen aspirin 81 MG tablet Take 81 mg by mouth daily.        . benzonatate (TESSALON) 200 MG capsule Take 200 mg by mouth 3 (three) times daily as needed.        . Calcium Carbonate-Vitamin D (RA CALCIUM PLUS VITAMIN D) 600-400 MG-UNIT per tablet Take 1 tablet by mouth 2 (two) times daily.        . chlorpheniramine-HYDROcodone (TUSSIONEX PENNKINETIC ER) 10-8 MG/5ML LQCR Take 5 mLs by mouth every 12 (twelve) hours as needed.        . cloNIDine (CATAPRES) 0.3 MG tablet Take 0.3 mg by mouth 2 (two) times daily.        . cyclobenzaprine (FLEXERIL) 10 MG tablet Twice  daily and as needed      . dextromethorphan-guaiFENesin (MUCINEX DM) 30-600 MG per 12 hr tablet Take 1 tablet by mouth 2 (two) times daily as needed.       . ezetimibe (ZETIA) 10 MG tablet Take 10 mg by mouth at bedtime.        . famotidine (PEPCID) 20 MG tablet Take 20 mg by mouth at bedtime.        . fesoterodine (TOVIAZ) 4 MG TB24 Take 4 mg by mouth daily.        . furosemide (LASIX) 20 MG tablet Take 20 mg by mouth daily as needed.        Marland Kitchen HYDROcodone-acetaminophen (VICODIN) 5-500 MG per tablet Take 1 tablet by mouth every 6 (six) hours as needed.        Marland Kitchen levothyroxine (SYNTHROID, LEVOTHROID) 137 MCG tablet Take 137 mcg by mouth daily.        Marland Kitchen losartan (COZAAR) 50 MG tablet Take 1 tablet (50 mg total) by mouth daily. Please schedule follow up appointment for further refills  30 tablet  0  . metFORMIN (GLUMETZA) 500 MG (MOD) 24 hr tablet Take 500 mg by mouth at bedtime.        . Multiple Vitamin (MULTIVITAMIN) capsule Take 1 capsule by mouth daily.        . Naproxen Sodium (ALEVE) 220 MG CAPS Take 1  capsule by mouth at bedtime as needed.        . pantoprazole (PROTONIX) 40 MG tablet Take 40 mg by mouth 2 (two) times daily.        . polyethylene glycol (MIRALAX / GLYCOLAX) packet Take 17 g by mouth as needed.        . Probiotic Product (PROBIOTIC PO) Take 1 capsule by mouth daily. As needed with antibiotics      . tiotropium (SPIRIVA) 18 MCG inhalation capsule Place 18 mcg into inhaler and inhale daily.        Marland Kitchen venlafaxine (EFFEXOR) 75 MG tablet Take 75 mg by mouth daily.        Marland Kitchen losartan (COZAAR) 50 MG tablet Take 50 mg by mouth daily.        . Magnesium 250 MG TABS Take 1 tablet by mouth daily.            Review of Systems Constitutional:   No  weight loss, night sweats,  Fevers, chills, fatigue, lassitude. HEENT:   No headaches,  Difficulty swallowing,  Tooth/dental problems,  Sore throat,                No sneezing, itching, ear ache, nasal congestion, post nasal drip,   CV:  No chest pain,  Orthopnea, PND, swelling in lower extremities, anasarca, dizziness, palpitations  GI  No heartburn, indigestion, abdominal pain, nausea, vomiting, diarrhea, change in bowel habits, loss of appetite  Resp: Notes  shortness of breath with exertion not    at rest.  Notes some  mucus, notes  productive cough,  Notes  non-productive cough,  No coughing up of blood.  No major change in color of mucus.  No wheezing.  No chest wall deformity  Skin: no rash or lesions.  GU: no dysuria, change in color of urine, no urgency or frequency.  No flank pain.  MS:  No joint pain or swelling.  No decreased range of motion.  No back pain.  Psych:  No change in mood or affect. No depression or anxiety.  No memory loss.  Objective:   Physical Exam Gen: Pleasant, well-nourished, in no distress,  normal affect  ENT: No lesions,  mouth clear,  oropharynx clear, no postnasal drip  Neck: No JVD, no TMG, no carotid bruits  Lungs: No use of accessory muscles, no dullness to percussion, clear without  rales or rhonchi  Cardiovascular: RRR, heart sounds normal, no murmur or gallops, no peripheral edema  Abdomen: soft and NT, no HSM,  BS normal  Musculoskeletal: No deformities, no cyanosis or clubbing  Neuro: alert, non focal  Skin: Warm, no lesions or rashes        Assessment & Plan:   COPD S/p bronchitis flare 2/12, now stable No change in medications. Return in       4 mo            Updated Medication List Outpatient Encounter Prescriptions as of 05/19/2010  Medication Sig Dispense Refill  . albuterol (PROVENTIL) (2.5 MG/3ML) 0.083% nebulizer solution Take 2.5 mg by nebulization every 4 (four) hours as needed.        Marland Kitchen aspirin 81 MG tablet Take 81 mg by mouth daily.        . benzonatate (TESSALON) 200 MG capsule Take 200 mg by mouth 3 (three) times daily as needed.        . Calcium Carbonate-Vitamin D (RA CALCIUM PLUS VITAMIN D) 600-400 MG-UNIT per tablet Take 1 tablet by mouth 2 (two) times daily.        . chlorpheniramine-HYDROcodone (TUSSIONEX PENNKINETIC ER) 10-8 MG/5ML LQCR Take 5 mLs by mouth every 12 (twelve) hours as needed.        . cloNIDine (CATAPRES) 0.3 MG tablet Take 0.3 mg by mouth 2 (two) times daily.        . cyclobenzaprine (FLEXERIL) 10 MG tablet Twice daily and as needed      . dextromethorphan-guaiFENesin (MUCINEX DM) 30-600 MG per 12 hr tablet Take 1 tablet by mouth 2 (two) times daily as needed.       . ezetimibe (ZETIA) 10 MG tablet Take 10 mg by mouth at bedtime.        . famotidine (PEPCID) 20 MG tablet Take 20 mg by mouth at bedtime.        . fesoterodine (TOVIAZ) 4 MG TB24 Take 4 mg by mouth daily.        . furosemide (LASIX) 20 MG tablet Take 20 mg by mouth daily as needed.        Marland Kitchen HYDROcodone-acetaminophen (VICODIN) 5-500 MG per tablet Take 1 tablet by mouth every 6 (six) hours as needed.        Marland Kitchen levothyroxine (SYNTHROID, LEVOTHROID) 137 MCG tablet Take 137 mcg by mouth daily.        Marland Kitchen losartan (COZAAR) 50 MG tablet Take 1 tablet (50 mg  total) by mouth daily. Please schedule follow up appointment for further refills  30 tablet  0  . magnesium oxide (MAG-OX) 400 MG tablet Take 400 mg by mouth daily.        . metFORMIN (GLUMETZA) 500 MG (MOD) 24 hr tablet Take 500 mg by mouth at bedtime.        . Multiple Vitamin (MULTIVITAMIN) capsule Take 1 capsule by mouth daily.        . Naproxen Sodium (ALEVE) 220 MG CAPS Take 1 capsule by mouth at bedtime as needed.        . pantoprazole (PROTONIX) 40 MG tablet Take 40 mg by mouth 2 (two) times daily.        Marland Kitchen  polyethylene glycol (MIRALAX / GLYCOLAX) packet Take 17 g by mouth as needed.        . Probiotic Product (PROBIOTIC PO) Take 1 capsule by mouth daily. As needed with antibiotics      . tiotropium (SPIRIVA) 18 MCG inhalation capsule Place 18 mcg into inhaler and inhale daily.        Marland Kitchen venlafaxine (EFFEXOR) 75 MG tablet Take 75 mg by mouth daily.        Marland Kitchen DISCONTD: losartan (COZAAR) 50 MG tablet Take 50 mg by mouth daily.        Marland Kitchen DISCONTD: Magnesium 250 MG TABS Take 1 tablet by mouth daily.

## 2010-05-20 NOTE — Assessment & Plan Note (Signed)
S/p bronchitis flare 2/12, now stable No change in medications. Return in       4 mo

## 2010-06-09 ENCOUNTER — Other Ambulatory Visit: Payer: Self-pay | Admitting: *Deleted

## 2010-06-09 MED ORDER — CLONIDINE HCL 0.3 MG PO TABS: 0.3000 mg | ORAL_TABLET | Freq: Two times a day (BID) | ORAL | Status: DC

## 2010-06-09 MED ORDER — LEVOTHYROXINE SODIUM 137 MCG PO TABS: 137.0000 ug | ORAL_TABLET | Freq: Every day | ORAL | Status: DC

## 2010-06-10 ENCOUNTER — Other Ambulatory Visit: Payer: Self-pay | Admitting: Critical Care Medicine

## 2010-06-14 ENCOUNTER — Other Ambulatory Visit: Payer: Self-pay | Admitting: *Deleted

## 2010-06-14 MED ORDER — LOSARTAN POTASSIUM 50 MG PO TABS: ORAL_TABLET | ORAL | Status: DC

## 2010-06-15 MED ORDER — CLONIDINE HCL 0.3 MG PO TABS: 0.3000 mg | ORAL_TABLET | Freq: Two times a day (BID) | ORAL | Status: DC

## 2010-06-21 NOTE — Assessment & Plan Note (Signed)
 HEALTHCARE                             PULMONARY OFFICE NOTE   Ana Nunez, Ana Nunez                         MRN:          [id number]  DATE:09/27/2006                            DOB:          [birth year]    Ana Nunez is a 77 year old white female with a history of asthmatic  bronchitis, chronic obstructive lung disease, recently hospitalized for  bilateral pneumonia, community acquired, atypical, between the 30th of  July and 8th of August.  Upon discharge, she has remained weak but her  level of dyspnea has improved.  She is still having considerable pseudo  wheeze.  She maintains Cozaar at 50 mg daily, clonidine 0.2 mg b.i.d.,  Synthroid 0.137 mg daily, Singulair 10 mg daily, Protonix 40 mg b.i.d.,  Zetia 10 mg daily, Spiriva 2 puffs daily, Effexor 75 mg daily, Serevent  1 spray b.i.d., Pulmicort 2 sprays b.i.d., Avandia 4 mg daily, baby  aspirin daily.   PHYSICAL EXAMINATION:  Temp is 96, blood pressure 124/78, pulse 97,  saturation was 94% on room air.  CHEST:  Showed prominent pseudo wheeze but clear peripherally.  CARDIAC EXAM:  Showed a regular rate and rhythm without S3, normal S1,  S2.  ABDOMEN:  Soft, nontender.  EXTREMITIES:  Showed no edema or clubbing.  SKIN:  Clear.   IMPRESSION:  Chronic obstructive lung disease with recent bilateral  pneumonia, community acquired.  Followup results form this show no  evidence of active pneumonitis.  Plan for this patient is to increase  Cozaar to 50 mg b.i.d.  She has finished her course of Ceftin,  prednisone and Diflucan, and we will see the patient back in return  followup in 1 month.  She is to continue her rehab efforts.     Charlcie Cradle Delford Field, MD, [facility]  Electronically Signed    PEW/MedQ  DD: 09/28/2006  DT: 09/29/2006  Job #: [id number]   cc:   Arta Silence, MD

## 2010-06-21 NOTE — Discharge Summary (Signed)
Ana Nunez, Ana Nunez                  ACCOUNT NO.:  [id number]   MEDICAL RECORD NO.:  [id number]          PATIENT TYPE:  INP   LOCATION:  1434                         [facility]ACILITY[facility]  [facility]   PHYSICIAN:  Charlcie Cradle. Delford Field, MD, FCCPDATE OF BIRTH:  10-14-31   DATE OF ADMISSION:  09/05/2006  DATE OF DISCHARGE:  09/14/2006                               DISCHARGE SUMMARY   DISCHARGE DIAGNOSES:  1. Bilateral interstitial fibrosis and alveolitis with community-      acquired pneumonia, no organism specified.  2. Asthmatic bronchitis flare in the setting of vocal cord      dysfunction.  3. Gastroesophageal reflux disease.  4. Chronic __________ .  5. Diabetes mellitus type 2 exacerbations from steroids.   HISTORY OF PRESENT ILLNESS:  Ana Nunez is a 77 year old white female  normally seen by Dr. Delford Field for her pulmonary conditions.  She was seen  in the office for acute evaluation. She had been having problems with  coughing, wheezing, and worsened dyspnea for several days.  She proved  refractory to outpatient treatment and was noted to be febrile with a  fever of 102, and was having yellow sputum.  For that reason, she was  admitted for further evaluation and treatment.   LABORATORY DATA:  Sodium 131, potassium 3.8, chloride 106, CO2 39,  glucose 81, BUN 26, creatinine 0.96.  Calcium 8.1.  WBC 12.2,  hemoglobin 10.5, hematocrit 30.7, platelets 406,000.  Arterial blood gas  on 2 L nasal cannula:  PH 7.42, pCO2 34, pO2 70 with a bicarb of 22.4.  ESR 93.  Total protein 5.5, albumin 2.3, AST 40, ALT 34, ALP 84.  Total  bilirubin 0.6.  B natriuretic peptide peak at 362. TSH 0.479.  Blood  cultures showed no growth.   RADIOGRAPHIC DATA:  Chest x-ray shows slight improvement of bilateral  infiltrates, mild bilateral effusions.   A 12-lead EKG shows normal sinus rhythm, ventricular rate of 87 beats  per minute.   HOSPITAL COURSE BY DISCHARGE DIAGNOSIS:  1. Bilateral interstitial infiltrates,  questionable alveolitis, most      like secondary to a pneumonia process.  She was treated with IV      steroids, IV antibiotics, nebulized bronchodilators.  She did      require oxygen for a short period of time but now on room air is      able to ambulate with saturations in the 92% range.  She reached      maximal hospital benefit by September 14, 2006.  She will be continued      on seven more days of antimicrobial therapy consisting of Ceftin.      She did complete antimicrobial therapy with Zithromax. She was      treated with steroids.  She was noted to have an elevated ESR.      Again she has reached hospital benefit and was discharged on September 14, 2006.  2. Asthmatic bronchitis and chronic obstructive pulmonary disease with      history of vocal cord dysfunction and gastroesophageal reflux  disease.  She was treated with the usual pharmaceutical      intervention.  She was continued on her proton pump inhibitors and      will be evaluated continually on an outpatient basis.  3. Steroid-induced hyperglycemia.  She was started on sliding scale      insulin for a short period of time.  Once she was taken off Solu-      Medrol and placed on prednisone, her glucose returned to more      manageable range.  Therefore, no further Lantus will be required.      She will remain on oral hypoglycemics.  4. Thrush.  She was started on Diflucan and Magic mouth wash which      will be continued for another seven days post discharge.   DISCHARGE MEDICATIONS:  1. Diflucan 100 mg one tablet until gone.  2. Magic mouth wash 15 mL three times a day for five days with repeat      if needed.  3. Ceftin 500 mg one tablet two times a day until gone.  4. Prednisone 10 mg two tablets for four days, one tab for four days      and then stop.  5. Cozaar 50 mg two tabs a day.  6. Clonidine 0.2 mg two times a day.  7. Synthroid 0.137 mg daily.  8. Singulair 10 mg daily.  9. Protonix __________  mg  three times a day.  10.Zetia 10 mg once a day.  11.Spiriva inhalation once a day.  12.Effexor XR 75 mg one daily.  13.Pulmicort 180 mcg two puffs daily.  14.Avandia 4 mg once a day.  15.Aspirin 81 mg once a day.  16.Magnesium 250 mg once a day.  17.Glucosamine one tablet three times a day.  18.Calcium with vitamin D two tablets a day.  19.Fish oil as before.  20.Albuterol 2.5 mg handheld nebulizer q.6h. as a p.r.n. rescue      medication.  21.Serevent one puff two times a day.   SPECIAL INSTRUCTIONS:  1. Mouth care as instructed.  2. She should be on no concentrated sweets.  3. Heart-healthy weight reduction diet.   FOLLOW UP:  She has a followup appointment with Dr. Shan Levans  August 21.   CONDITION ON DISCHARGE:  She is discharged in improved condition.      Devra Dopp, MSN, ACNP      Charlcie Cradle. Delford Field, MD, [facility]  Electronically Signed    SM/MEDQ  D:  09/14/2006  T:  09/14/2006  Job:  [id number]

## 2010-06-21 NOTE — Procedures (Signed)
DUPLEX DEEP VENOUS EXAM - LOWER EXTREMITY   INDICATION:  Left leg venous swelling.   HISTORY:  Edema:  Yes.  Trauma/Surgery:  No.  Pain:  Yes.  PE:  No.  Previous DVT:  No.  Anticoagulants:  None.  Other:   DUPLEX EXAM:                CFV   SFV   PopV  PTV    GSV                R  L  R  L  R  L  R   L  R  L  Thrombosis    0  0     0     0      0     0  Spontaneous   +  +     +     +      +     +  Phasic        +  +     +     +      +     +  Augmentation  +  +     +     +      +     +  Compressible  +  +     +     +      +     +  Competent     +  +     +     +      +     +   Legend:  + - yes  o - no  p - partial  D - decreased   IMPRESSION:  1. No evidence of deep vein thrombosis noted in the left leg.  2. Felix Pacini with results.    _____________________________  Janetta Hora Fields, MD   MG/MEDQ  D:  10/19/2008  T:  10/20/2008  Job:  [id number]

## 2010-06-21 NOTE — H&P (Signed)
NAMELANNIE, [name]                  ACCOUNT NO.:  [id number]   MEDICAL RECORD NO.:  [id number]          PATIENT TYPE:  INP   LOCATION:  1222                         FACILITY:  [facility]   PHYSICIAN:  Coralyn Helling, MD        DATE OF BIRTH:  May 28, 1929   DATE OF ADMISSION:  09/05/2006  DATE OF DISCHARGE:                              HISTORY & PHYSICAL   ADMITTING DIAGNOSIS:  Acute chronic obstructive pulmonary disease  exacerbation and hypoxemia.   [name] is a 77 year old female who is normally seen by Dr. Delford Field for  her pulmonary condition.  She was seen in the office today for an acute  evaluation.  She had been having problems with coughing, wheezing and  worsening dyspnea over the last several days.  Her husband says that she  had been having fevers and sweats.  He believes her temperature was up  to 102 but has not actually checked it.  She has been having a cough  productive of brownish-yellow sputum.  She does complain of chest  tightness as well.  She had not had any abdominal pain, nausea or  vomiting.  She has not had any diarrhea.  She also denies any skin  rashes or swelling.   PAST MEDICAL HISTORY:  Significant for:  1. Colon polyps.  2. Multiple sclerosis.  3. Stress incontinence.  4. COPD with asthmatic bronchitis.  5. Vocal cord dysfunction.  6. Hypothyroidism.  7. Questionable TIA.  8. Gastroesophageal reflux disease.  9. Hypertension.  10.Pneumonia breast cancer in 2006.  11.She has also had tubal ligation, tonsillectomy and adenoidectomy,      and appendectomy.   She has no known drug allergies.   CURRENT MEDICATIONS:  1. Cozaar 50 mg b.i.d.  2. Clonidine 0.2 mg b.i.d.  3. Synthroid 0.137 mg daily.  4. Singulair 10 mg daily.  5. Protonix 40 mg b.i.d.  6. Zetia 10 mg daily.  7. Spiriva two puffs daily.  8. Effexor XR 75 mg daily.  9. Serevent one puff b.i.d.  10.Pulmicort 180 mcg two puffs b.i.d.  11.Avandia 4 mg daily.  12.Aspirin 81 mg daily.  13.Magnesium 250 mg daily.  14.Glucosamine b.i.d.  15.Calcium with vitamin D b.i.d.  16.Fish oil.   FAMILY HISTORY:  Significant for her mother who has COPD and pancreatic  cancer, a sister has pancreatic cancer and another sister has stomach  cancer.   SOCIAL HISTORY:  She is a retired Engineer, civil (consulting).  She is married and lives with  husband.  She quit smoking several years ago and used to smoked two  packs of cigarettes a day for 40 years.  There is no history of alcohol  use.   REVIEW OF SYSTEMS:  Unremarkable except for stated above.   PHYSICAL EXAMINATION:  She is seen in the office.  She appears to be in  some degree of respiratory distress and appears to be flushed but is  able to speak in brief sentences.  Temperature is 98, blood pressure is  108/60, heart rate is 114, oxygen saturation is 84% after exertion but  88% at rest.  HEENT:  Pupils reactive.  There is no sinus tenderness.  No oral  lesions.  No lymphadenopathy.  HEART:  S1, S2, tachycardiac.  CHEST:  There was decreased aeration bilaterally with fine wheezes heard  but no rales.  ABDOMEN:  Soft, nontender.  EXTREMITIES:  No edema, cyanosis or clubbing.  NEUROLOGIC:  No focal deficits were appreciated.   IMPRESSION:  1. Acute chronic obstructive pulmonary disease exacerbation.  She is      to be admitted to telemetry.  I will start her on Solu-Medrol as      well as Rocephin and Zithromax.  I would start her on nebulizer      treatments with Atrovent and Ventolin around-the-clock, as well as      continue her on her Singulair.  I will follow up on her chest x-ray      and arterial blood gas.  I will also have blood culture sent.  2. Hypoxemia related to her chronic obstructive pulmonary disease      exacerbation.  I will place her on supplemental oxygen to keep her      oxygen saturation greater than 92% and follow up on her arterial      blood gas.  3. Diabetes.  I will start her on the sliding scale insulin  protocol      as well as keeping her on a modified carbohydrate diet.  4. Hypertension.  I will continue her on her Cozaar and clonidine with      hold parameters.  5. Hypothyroidism.  I will continue her on her Synthroid.  6. Gastroesophageal reflux disease.  I will continue her on Protonix      and Nexium.  7. Deep venous thrombosis prophylaxis.  I will start her on Lovenox.  8. History of breast cancer.  9. History of multiple sclerosis.      Coralyn Helling, MD  Electronically Signed     VS/MEDQ  D:  09/05/2006  T:  09/06/2006  Job:  [id number]

## 2010-06-21 NOTE — Assessment & Plan Note (Signed)
[facility]                             PULMONARY OFFICE NOTE   Ana Nunez, Ana Nunez                         MRN:          [id number]  DATE:10/29/2006                            DOB:          1929/06/16    Ana Nunez returns in followup, a 77 year old female with a history of  recent pneumonia this past summer and hospitalized for the same.  She  had a debilitation.  She is now recovered from this, has decreased  cough, decreased shortness of breath.  She has graduated from physical  therapy.  Her maintenance medication profile is unchanged.  She  specifically maintains Spiriva daily, Serevent b.i.d., Pulmicort two  sprays b.i.d.   PHYSICAL EXAMINATION:  This is an obese female in no distress.  Temperature 97, blood pressure 118/74, pulse 71, saturation 96% on room  air.  CHEST:  Diminished breath sounds with prolonged expiratory phase.  No  wheeze or rhonchi noted.  CARDIAC:  Regular rate and rhythm without S3.  Normal S1 and S2.  ABDOMEN:  Soft, nontender.  EXTREMITIES:  No edema or clubbing.   IMPRESSION:  1. Chronic obstructive lung disease.  2. Asthmatic bronchitis, stable at this time.  3. Recent pneumonia.   PLAN:  Maintain inhaled medicines as is.  Refills are given.  We will  see the patient back in two months.     Charlcie Cradle Delford Field, MD, [facility]  Electronically Signed    PEW/MedQ  DD: 10/29/2006  DT: 10/29/2006  Job #: [id number]   cc:   Arta Silence, MD

## 2010-06-21 NOTE — Assessment & Plan Note (Signed)
[facility]                             PULMONARY OFFICE NOTE   Ana Nunez, Ana Nunez                         MRN:          [id number]  DATE:07/26/2006                            DOB:          Jun 16, 1929    Ana Nunez returns today in followup. A 77 year old white female with a  history of chronic obstructive lung disease and asthma, vocal cord  dysfunction and reflux disease. She is at a stable baseline, minimal  wheezing.   MEDICATIONS:  1. Spiriva daily.  2. Serevent 1 spray b.i.d.  3. Pulmicort 2 sprays b.i.d.   PHYSICAL EXAMINATION:  VITAL SIGNS:  Temperature 97, blood pressure  120/70, pulse 89, saturation 98% on room air.  CHEST:  Showed distant breath sounds, there was upper airway prominent  pseudowheeze noted.  CARDIAC:  Showed a regular rate and rhythm without S3, normal S1, S2.  ABDOMEN:  Soft, nontender.  EXTREMITIES:  Showed no edema or clubbing or venous disease.  SKIN:  Clear.   IMPRESSION:  Chronic obstructive lung disease asthmatic bronchitic  component stable at this time.   PLAN:  Maintain inhaled medicines as is and will see the patient back in  3 months.     Charlcie Cradle Delford Field, MD, [facility]  Electronically Signed    PEW/MedQ  DD: 07/26/2006  DT: 07/27/2006  Job #: [id number]   cc:   Arta Silence, MD

## 2010-06-24 NOTE — Op Note (Signed)
[facility]  Patient:    Ana Nunez, Ana Nunez Visit Number: [id number] MRN: [id number]          Service Type: MED Location: 3W [phone] Attending Physician:  Caleb Popp Dictated by:   Charlcie Cradle Delford Field, M.D. [facility] Proc. Date: 06/17/01 Admit Date:  06/13/2001                             Operative Report  PROCEDURE:  Bronchoscopy.  INDICATION:  Evaluate for upper airway obstruction.  OPERATOR:  Charlcie Cradle. Delford Field, M.D.  ANESTHESIA:  1% Xylocaine local.  PREOPERATIVE MEDICATIONS:  Fentanyl 100 mcg IV push, Versed 10 mg IV push.  DESCRIPTION OF PROCEDURE:  The Pentax bronchoscope was introduced to the right naris.  The upper airways were visualized and showed no evidence of vocal cord dysfunction.  The cords were both widely patent and did not show paradoxic obstruction or closure during either inspiration or expiration.  Attention was then paid to the lower airway.  The posterior membranous trachea showed some dynamic collapse with coughing but not to the point of complete closure of the airway.  No other evidence of tracheomalacia was seen.  The lower airway showed mucus plugging which were suctioned free.  Mild tracheobronchitis was identified.  No other endobronchial lesions were seen.  No evidence of upper airway obstruction was identified.  COMPLICATIONS:  None.  SPECIMENS OBTAINED:  None.  IMPRESSION:  Mucus plugging lower lobe secondary to asthmatic bronchitic flare without evidence of upper airway obstruction or significant vocal cord dysfunction.  RECOMMENDATIONS:  Continue present inpatient hospital therapy, including high-dose IV steroids. Dictated by:   Charlcie Cradle Delford Field, M.D. [facility] Attending Physician:  Caleb Popp DD:  06/17/01 TD:  06/17/01 Job: [id number]

## 2010-06-24 NOTE — H&P (Signed)
[facility]  Patient:    Ana Nunez, Ana Nunez Visit Number: [id number] MRN: [id number]          Service Type: MED Location: 3W [phone] Attending Physician:  Caleb Popp Dictated by:   Earley Favor, RN, MSN, ACNP Admit Date:  06/13/2001                           History and Physical  DATE OF BIRTH:  12-10-1929  CHIEF COMPLAINT:  Dyspnea.  HISTORY OF PRESENT ILLNESS:  The patient is a 77 year old white female with extrinsic asthma, questionable vocal cord dysfunction, gastroesophageal reflux disease, increasing stridor recently, with an ENT evaluation negative for vocal cord dysfunction.  There is a questionable tracheal obstruction versus persistent air flow obstruction with increasing asthmatic flare.  She will be admitted for the asthmatic flare at this time.  PAST MEDICAL HISTORY:  1. Multiple sclerosis.  2. Stress incontinence.  3. Multiple episodes of bronchitis.  4. History of hypothyroidism.  5. Presumed history of transient ischemic attacks.  6. Status post tubal ligation.  7. Tonsillectomy and adenoidectomy.  8. Appendectomy.  CURRENT MEDICATIONS:  1. Norvasc 10 mg q.d.  2. Cozaar 50 mg b.i.d.  3. Clonidine 0.2 mg 1 q.d.  4. Synthroid 0.15 mg q.d.  5. Clonazepam 1 mg b.i.d.  6. Singulair 10 mg q.d.  7. Flexeril 10 mg 1/2 b.i.d. and q.h.s.  8. Multivitamin 1 q.d.  9. Calcium plus vitamin D b.i.d. 10. Ginkgo biloba 4 tablets q.d. 11. Vitamin E 400 IU 1 q.d. 12. Tylenol P.M. 1 q.h.s. 13. St. John wort 3 tablets q.d. 14. Serevent 2 puffs b.i.d. 15. Protonix 40 mg 1 b.i.d. 16. Vitamin C 500 mg q.d. 17. Neurontin 300 mg 1 t.i.d. 18. Glucophage XR p.r.n. for blood sugars greater than 100. 19. Albuterol hand-held nebulizer p.r.n.  ALLERGIES:  No known allergies.  SOCIAL HISTORY, FAMILY HISTORY:  Are well documented on previous admissions. She currently is a nonsmoker.  She is married, with a daughter who is a  Engineer, civil (consulting). Family history was taken in detail and is noncontributory to this admission.  REVIEW OF SYSTEMS:  As noted in the HPI.  Without other changes.  PHYSICAL EXAMINATION:  VITAL SIGNS:  Afebrile, vital signs stable.  Height 67 inches, weight 226 pounds.  The O2 saturations are 98% on room air.  GENERAL:  Morbidly obese white female in no acute distress.  HEENT:  Possible wheeze is noted.  Oropharynx is unremarkable.  CHEST:  Wheezes and rhonchi bilaterally in all lung fields.  HEART:  Heart sounds are difficult to distinguish due to the volume of her breath sounds, but it appears regular rate and rhythm.  ABDOMEN:  Obese, soft, nontender.  GENITOURINARY:  She voids.  EXTREMITIES:  Without edema.  She is able to ambulate with mild exertion.  IMPRESSION AND PLAN:  Status asthmaticus.  Will admit for IV steroids, IV antibiotics, nebulized bronchodilators, and O2.  For the esophageal reflux disease reflux disease she will continue on b.i.d. proton pump inhibitor.  For her known history of hypertension she will be continued on her antihypertensive.  For her chronic pain she will be continued on Neurontin. Her gastroesophageal reflux disease is being addressed with Protonix 40 mg b.i.d.  She is having a CT of the sinuses without contrast to evaluate sinusitis. Dictated by:   Earley Favor, RN, MSN, ACNP Attending Physician:  Caleb Popp DD:  06/13/01 TD:  06/14/01 Job: [id number]

## 2010-06-24 NOTE — Discharge Summary (Signed)
[place]  Patient:    Ana Nunez, Ana Nunez                         MRN: [id number] Adm. Date:  [id number] Disc. Date: [id number] Attending:  Caleb Popp                           Discharge Summary  DISCHARGE DIAGNOSES:  1. Status asthmaticus, resolving.  2. Vocal cord dysfunction with pseudo wheeze.  3. Gastroesophageal reflux disease.  4. Pansinusitis.  5. Hyperglycemia secondary to steroid usage.  HISTORY OF PRESENT ILLNESS:  A 77 year old white female with history of longstanding asthma, difficult to control, with associated chronic and recurrent sinus infection and high level gastroesophageal reflux, associated vocal cord dysfunction, underlying multiple sclerosis, admitted from home today with increasing bronchospasm, increased dyspnea, increased cough, dropping peak flow rate.  She is on maximum outpatient therapy, thus admitted for inpatient care.  Past medical history and physical exam are in the history and physical already on the chart.  HOSPITAL COURSE:  Patient was admitted to a regular room, placed on IV steroids, IV antibiotics, and intensive nebulizer treatments.  Admission labs show a sodium of 135, potassium 4.5, chloride 102, CO2 28, BUN 23, creatinine 0.9.  CBC:  White count 12,000, hemoglobin 13.5.  Room air blood gas - pH 7.45, PCO2 34, PO2 63.  Chest x-ray showed no active disease.  Sinus CT scan showed a thickening of the mucosal membranes and air fluid levels associated with frontoethmoidal complexes bilaterally, right worse than left.  The patient was gradually tapered to oral medications and ready for discharge by Jun 28, 1999.  She is now ambulatory taking a low-calorie diet, low-salt diet, and in no acute distress, still having some minimal vocal cord dysfunction.  DISCHARGE MEDICATIONS:  1. Norvasc 10 mg daily.  2. Cozaar 50 mg b.i.d.  3. Clonidine 0.2 mg b.i.d.  4. Synthroid 0.175 mg daily.  5. Protonix  40 mg daily.  6. Klonopin 1 mg b.i.d.  7. Singulair 10 mg daily.  8. Flexeril 1 mg b.i.d.  9. Albuterol inhaler p.r.n. 10. Nasonex two sprays each nostril b.i.d. 11. Serevent two puffs b.i.d. 12. Pulmicort four puffs b.i.d. 13. Augmentin 875 mg b.i.d. for another seven days, then stop. 14. Prednisone 60 mg a day tapering down by 20 mg every three days until off.  FOLLOW-UP:  Patient will return to see me in one weeks time as an outpatient to follow up.  She will monitor her blood sugars as an outpatient.  Her sugar this morning was 55. DD:  06/28/99 TD:  06/30/99 Job: [id number]

## 2010-06-24 NOTE — Assessment & Plan Note (Signed)
[facility]                             PULMONARY OFFICE NOTE   LAYLONIE, MARZEC                         MRN:          [id number]  DATE:03/21/2006                            DOB:          12-15-1929    Mrs. Braziel returns today in followup and is less short of breath,  coughing less. She is improved compared to her visit in January.   MEDICATIONS:  1. Singulair 10 mg daily.  2. Protonix 40 mg b.i.d.  3. Spiriva daily.  4. Serevent one spray b.i.d.  5. Pulmicort two sprays b.i.d.   Other maintenance medicines are correct as reviewed and unchanged in the  chart.   PHYSICAL EXAMINATION:  VITAL SIGNS:  Temperature 97, blood pressure  130/76, pulse 103, saturation 96% on room air.  CHEST:  Clear bilaterally without evidence of wheeze, rales or rhonchi.  CARDIAC:  Regular rate and rhythm without S3.  Normal S1 and S2.  ABDOMEN:  Soft, nontender.  EXTREMITIES:  No edema or clubbing.  SKIN:  Clear.   IMPRESSION:  1. Stable, moderate persistent asthma.  2. Chronic obstructive lung disease.  3. Vocal cord dysfunction syndrome.  4. Reflux disease.   PLAN:  Maintain inhaled medicines at the current dose.  Will see the  patient back in followup in three months.     Charlcie Cradle Delford Field, MD, [facility]  Electronically Signed    PEW/MedQ  DD: 03/21/2006  DT: 03/21/2006  Job #: [id number]   cc:   Arta Silence, MD

## 2010-06-24 NOTE — Assessment & Plan Note (Signed)
[facility]                               PULMONARY OFFICE NOTE   Ana, Nunez                         MRN:          [id number]  DATE:08/18/2005                            DOB:          04/19/1929    Ana Nunez returns today in followup.  This is a 77 year old white female,  history of extrinsic asthma, vocal cord dysfunction syndrome, and reflux  disease.  Overall, the patient is considerably better with no active  respiratory complaints with no cough or congestion maintaining Pulmicort 2  sprays twice daily, Spiriva daily, Singulair 10 mg daily, Serevent 1 spray  b.i.d.  Pulmicort is supposedly at 4 sprays b.i.d., but the patient is not compliant  with this.   PHYSICAL EXAMINATION:  GENERAL: This is an obese white female in no  distress.  VITAL SIGNS: Temp 97, blood pressure 120/82, pulse 69, saturation 96% on  room air.  CHEST:  Showed diminished breath sounds with prolonged expiratory phase, no  wheeze or rhonchi noted.  CARDIAC:  Exam showed a regular rate and rhythm without S3, normal S1, S2.  ABDOMEN:  Soft, protuberant.  Bowel sounds active.  EXTREMITIES:  Showed no edema or clubbing.   IMPRESSION:  1.  Stable asthmatic bronchitis.  2.  Chronic obstructive lung disease.  3.  Vocal cord dysfunction syndrome.  4.  Reflux disease.   PLAN:  1.  Maintain all inhaled medicines as is with the exception of reducing      Pulmicort to 2 sprays b.i.d.  2.  We will see the patient back in followup in 2 months.                                   Charlcie Cradle Delford Field, MD, [facility]   PEW/MedQ  DD:  08/18/2005  DT:  08/19/2005  Job #:  [id number]   cc:   Arta Silence, MD

## 2010-06-24 NOTE — Assessment & Plan Note (Signed)
[facility]                         GASTROENTEROLOGY OFFICE NOTE   JERLYN, PAIN                         MRN:          [id number]  DATE:02/08/2006                            DOB:          1929-08-31    This is the mother of  my previous nurse, Haze Justin, who has multiple  medical problems with the primary problem being her multiple sclerosis.  She says she has no GI symptoms at this time.  She wondered whether  colonoscopy needed to be repeated but it was done in 2005 and I  indicated that she could have a recall probably in 7 years unless there  was really a problem.  She did have a small 3 mm polyp which was removed  but nothing else of significance.  She has a very tortuous, redundant  colon, and I could not get in to the cecum because of that.  But, in any  case, she denies any other GI symptomatology.   OTHER MEDICAL PROBLEMS:  Include:  1. Significant COPD with asthma.  2. Hyperlipidemia.  3. Hypothyroidism.  4. Arthritis.  5. Breast cancer.  6. Hypertension.  7. She is status post tubal ligation.  8. Surgery for breast cancer.  9. Appendectomy.   Her social history is really noncontributory;  nor is her family  history.   REVIEW OF SYSTEMS:  Noncontributory.   PHYSICAL EXAMINATION:  She is 5 feet 5-1/2 inches, weighs 256.  Blood  pressure 140/66.  Pulse 84 and regular.  Oropharynx was negative.  Neck was negative.  Chest was clear.  Heart revealed a regular rhythm without a significant murmur.  ABDOMEN:  Soft. No masses or organomegaly.  Supraclavicular area was within normal limits.  EXTREMITIES:  Unremarkable.  RECTAL:  Deferred.   IMPRESSION:  1. Status post colon polyps in a patient who has a redundant colon      and, otherwise, has no real gastrointestinal symptoms at this time.  2. History of asthma and chronic obstructive pulmonary disease.  3. Vocal cord dysfunction.  4. Multiple sclerosis.  5. Gastroesophageal  reflux disease.  6. Moderate obesity.   RECOMMENDATIONS:  To continue her Protonix 40 mg taking it b.i.d.  I  told her she did not need any further studies at this time.     Ulyess Mort, MD  Electronically Signed    SML/MedQ  DD: 02/08/2006  DT: 02/08/2006  Job #: [id number]

## 2010-06-24 NOTE — Discharge Summary (Signed)
. [facility]  Patient:    Ana Nunez, Ana Nunez                         MRN: [id number] Adm. Date:  [id number] Disc. Date: [id number] Attending:  Caleb Popp                           Discharge Summary  DISCHARGE DIAGNOSES:  1. Status asthmaticus, now improved.  2. Vocal cord dysfunction.  3. Gastroesophageal reflux disease.  4. Multiple sclerosis with motor dysfunction.  OPERATIONS AND PROCEDURES:  None.  HISTORY OF PRESENT ILLNESS:  See history and physical already dictated on the chart.  HOSPITAL COURSE:  The patient was admitted to a regular room.  Initial lab values obtained showed a white count of 7.2, hemoglobin of 13.1, platelet count of 203.  Room air blood gas pH 7.45, PCO2 34, PO2 62.  Sodium 133, potassium 4.3, chloride 102, CO2 23, BUN 15, creatinine 1.0.  Chest x-ray showed no active disease.  Sinus CT scan showed thickening of mucosal membranes and air fluid level associated with frontal ethmoidal complexes bilaterally, right worse than left, consistent with sinusitis.  The patient was given IV antibiotics, intensive nebulizer treatments, IV Solu-Medrol with this, and the patient gradually improved and was ready for release by Jun 28, 1999.  At the time of release, the patient is in improved status.  DISCHARGE MEDICATIONS:  1. Augmentin 875 mg p.o. b.i.d. for another seven days and stop.  2. Prednisone 60 mg a day with a rapid taper.  3. Pulmicort 4 sprays b.i.d.  4. Serevent 2 sprays b.i.d.  5. Norvasc 10 mg daily.  6. Cozaar 100 mg daily.  7. Clonidine 0.2 mg daily.  8. Synthroid 0.175 mg daily.  9. Prilosec 20 mg daily. 10. Clonazepam 1 mg daily. 11. Singulair 10 mg daily. 12. Flexeril 10 mg b.i.d. 13. Centrum daily. 14. Multiple vitamin supplements. 15. Nasonex 2 sprays each nostril b.i.d. 16. Protonix 40 mg daily. 17. Albuterol spray p.r.n.  FOLLOW-UP:  The patient will return to see me in two weeks time in  follow-up.  CONDITION ON DISCHARGE:  The patient is discharged in improved and stable condition taking a low salt diet. DD:  07/05/99 TD:  07/06/99 Job: [id number]

## 2010-06-24 NOTE — Op Note (Signed)
NAMEEDMUND, RICK                  ACCOUNT NO.:  [id number]   MEDICAL RECORD NO.:  [id number]          PATIENT TYPE:  OIB   LOCATION:  2550                         FACILITY:  [facility]   PHYSICIAN:  Rose Phi. Maple Hudson, M.D.   DATE OF BIRTH:  Oct 31, 1929   DATE OF PROCEDURE:  03/07/2004  DATE OF DISCHARGE:                                 OPERATIVE REPORT   PREOPERATIVE DIAGNOSIS:  Stage I carcinoma of the right breast.   POSTOPERATIVE DIAGNOSIS:  Stage I carcinoma of the right breast.   OPERATION:  1.  Blue dye injection.  2.  Right sentinel lymph node biopsy.  Right partial mastectomy.   SURGEON:  Rose Phi. Maple Hudson, M.D.   ANESTHESIA:  General.   OPERATIVE PROCEDURE:  Prior to going to the operating room, 1 mCi of  technetium sulfur colloid was injected intradermally.   After suitable general anesthesia was induced the patient was placed in  supine position with the arms extended on the arm board.  Five milliliters  of a mixture of 2 mL of methylene blue and 3 mL of injectable saline was  injected in the subareolar breast tissue and the breast gently massaged for  three minutes.  We then prepped and draped the breast and axilla in a  standard fashion.   A short right axillary incision was made with dissection through  subcutaneous tissue to the clavipectoral fascia.  Just deep to it was a  lymph node with some radioactivity but no blue dye.  I removed that as  a  sentinel node.  Careful inspection revealed no other blue, hot or palpable  nodes.  That node was then submitted to the pathologist for evaluation.   While that was being done, an elliptical incision incorporating the previous  radial biopsy incision at the 9 o'clock position of the right breast was  then outlined and then made, and a wide excision of the previous biopsy site  was carried out.  Hemostasis obtained with cautery.  The specimen was  oriented for the pathologist and submitted for touch prep for margins.   The  evaluation of the lymph node showed no evidence of metastatic disease,  and the margins on the lumpectomy site looked clean.   Both incisions were then closed with 3-0 Vicryl and subcuticular 4-0  Monocryl and Steri-Strips.   Dressings were then applied and the patient transferred to the recovery room  in satisfactory condition, having tolerated procedure well.      PRY/MEDQ  D:  03/07/2004  T:  03/07/2004  Job:  [id number]

## 2010-06-24 NOTE — H&P (Signed)
NAME:  Ana Nunez, Ana Nunez                            ACCOUNT NO.:  [id number]   MEDICAL RECORD NO.:  [id number]                   PATIENT TYPE:  INP   LOCATION:  0374                                 [facility]ACILITY:  [facility]   PHYSICIAN:  Shan Levans, M.D. [facility]            DATE OF BIRTH:  [birth year]   DATE OF ADMISSION:  05/12/2002  DATE OF DISCHARGE:                                HISTORY & PHYSICAL   CHIEF COMPLAINT:  Respiratory distress.   HISTORY OF PRESENT ILLNESS:  This is a 77 year old white female with history  of extrinsic asthma, upper airway obstruction, vocal cord dysfunction  syndrome comes to the office today in a considerable amount of respiratory  distress, increasing shortness of breath for two weeks, increasing cough  with a thick yellow mucus, increased difficulty talking and breathing at the  same time.  The patient's symptoms have gotten progressively worse, she is  bringing up thick yellow mucus and now is admitted for further inpatient  care.   PAST MEDICAL HISTORY:  1. Multiple sclerosis.  2. Stress incontinence.  3. Multiple episodes of bronchitis.  4. History of hypothyroidism.  5. History of transient ischemic attacks.  6. Post tubal ligation.  7. Tonsillectomy.  8. Adenoidectomy.  9. Appendectomy.   ALLERGIES:  None.   CURRENT MEDICATIONS:  1. Clonidine 0.2 mg b.i.d.  2. Synthroid 0.15 mg daily.  3. Clonazepam 1 mg b.i.d.  4. Singulair 10 mg daily.  5. Flexeril 10 mg b.i.d.  6. Nasonex two sprays b.i.d. each nostril.  7. Serevent Diskus two sprays b.i.d.  8. Protonix 40 mg b.i.d.  9. Pulmicort four sprays b.i.d.  10.      Prednisone 10 mg daily.  11.      Topamax 100 mg daily.  12.      Effexor XR 75 mg daily.  13.      Avandia 4 mg daily.  14.      Cozaar 50 mg daily.   SOCIAL HISTORY AND FAMILY HISTORY:  Well documented in previous admissions.  No change.  She is a current nonsmoker.  Married with a daughter who is a  Engineer, civil (consulting), works at [employer].  Family history is noncontributory.   REVIEW OF SYSTEMS:  Noncontributory.   PHYSICAL EXAMINATION:  VITAL SIGNS:  Temp 97, blood pressure 98/70, pulse  101.  Saturation 99% room air.  HEENT:  Oropharynx clear, nares clear.  NECK:  Supple.  No jugular venous distention, no lymphadenopathy.  CHEST:  Inspiratory and expiratory wheeze with poor air movement.  CARDIAC:  Resting tachycardia without S3, normal S1 & S2.  ABDOMEN:  Protuberant, bowel sounds active.  EXTREMITIES:  No edema or clubbing.  NEUROLOGICAL:  Intact.   LABORATORY DATA:  Pending at the time of this dictation.   IMPRESSION:  Asthmatic bronchitic exacerbation with mucus plugging and  respiratory distress.   RECOMMENDATIONS:  Admit to a  regular room, give IV corticosteroids, obtain a  sputum C&S and gram-stain, intensive neb treatments, administer oxygen, IV  Rocephin.                                               Shan Levans, M.D. [facility]    PW/MEDQ  D:  05/12/2002  T:  05/12/2002  Job:  [id number]

## 2010-06-24 NOTE — H&P (Signed)
[facility]  Patient:    Ana Nunez, Ana Nunez                           MRN: [id number] Attending:  Charlcie Cradle. Delford Field, M.D. [facility]                         History and Physical  CHIEF COMPLAINT:  Status asthmaticus.  HISTORY OF PRESENT ILLNESS:  This is a 77 year old white female with history of long standing asthma, difficult to control, with associated chronic and recurrent sinus infection and also high level gastroesophageal reflux disease with associated vocal cord dysfunction and underlying multiple sclerosis. Patient is admitted today from home with increasing bronchospasm, increased dyspnea, increased cough, peak flow rate down to 200.  Patient is on maximum outpatient therapy, thus admitted for inpatient care.  PAST MEDICAL HISTORY:  ALLERGIES:  None.  OPERATIONS:  Appendectomy, tubal ligation, tonsillectomy.  MAJOR MEDICAL ILLNESSES:  Asthma for many years, hypertensive crisis in the past now with current hypertension, history of multiple sclerosis where she gets muscle spasms, loss of balance, loss of vision and memory disturbance, history of pneumonia previously, multiple admissions for asthma in the past two to three years.  SOCIAL HISTORY:  Patient has not smoked in seven years.  Did smoke two packs a day for 40 years.  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  Moved here from [place] two years ago.  Has five children who are alive and well.  FAMILY HISTORY:  Emphysema in a mother, pancreatic cancer in the mother and sister, and stomach cancer in sister.  CURRENT MEDICATIONS:  Norvasc 10 mg daily, Cozaar 50 mg b.i.d., clonidine 0.2 mg daily, Synthroid 0.175 mg daily, Prilosec 20 mg daily, clonazepam 1 mg b.i.d., Singular 10 mg daily, cyclobenzaprine one tablet b.i.d., Centrum one daily, vitamin C 500 mg daily, calcium, vitamin B, and ginkgo biloba supplements daily, vitamin E supplements, St. Johns wort supplements, Nasonex two sprays each  nostril b.i.d., Protonix 40 mg daily, Serevent two puffs b.i.d., Pulmicort four puffs b.i.d., albuterol spray p.r.n.  PHYSICAL EXAMINATION:  GENERAL:  She is a well-developed, well-nourished white female in mild respiratory distress.  CHEST:  Shows inspiratory and expiratory wheeze.  Very poor air movement in upper airway.  Inspiratory stridor.  CARDIAC:  Shows regular rate and rhythm without an S3.  Normal S1, S2.  No murmur, rub, or gallop.  ABDOMEN:  Soft, nontender.  Bowel sounds active.  EXTREMITIES:  Show no edema, clubbing, or venous disease.  NEUROLOGIC EXAM:  Grossly intact.  SKIN:  Clear.  HEENT:  No jugular venous distension.  NECK:  No lymphadenopathy.  LABORATORY DATA:  All pending at the time of this dictation.  IMPRESSION:  Status asthmaticus with associated vocal cord dysfunction and participating factors including allergic rhinitis, sinusitis, recurrent, and high level gastroesophageal reflux disease in a patient with underlying multiple sclerosis and hypertension.  RECOMMENDATIONS: 1. Give IV steroids. 2. Give intensive nebulizer treatments. 3. Check CT scan of the sinus for sinusitis. 4. Give IV Rocephin. 5. Give mucolytics. DD:  06/21/99 TD:  06/21/99 Job: [id number]

## 2010-06-24 NOTE — Discharge Summary (Signed)
NAME:  Ana Nunez, Ana Nunez                            ACCOUNT NO.:  [id number]   MEDICAL RECORD NO.:  [id number]                   PATIENT TYPE:  INP   LOCATION:  0374                                 FACILITY:  [facility]   PHYSICIAN:  Shan Levans, M.D. [facility]            DATE OF BIRTH:  12-30-1929   DATE OF ADMISSION:  05/12/2002  DATE OF DISCHARGE:  05/16/2002                                 DISCHARGE SUMMARY   DISCHARGE DIAGNOSES:  1. Acute exacerbation of asthma.  2. Gastroesophageal reflux disease with vocal cord dysfunction.  3. Diabetes mellitus type 2 with steroid exacerbation.  4. Morbid obesity.  5. Multiple sclerosis.   HISTORY OF PRESENT ILLNESS:  The patient is a very complex 77 year old white  female patient of Dr. Shan Levans who presents with vocal cord  dysfunction syndrome with increasing respiratory distress, shortness of  breath, increasing cough with mucous, and decompensation due to her complex  past medical history which included multiple sclerosis, stress incontinence,  multiple bronchitis episodes, hypothyroidism, transient ischemic attacks,  tonsillectomy, adenoidectomy, and appendectomy.  She was admitted for  further evaluation and treatment.   LABORATORY DATA:  Throat cultures with normal oropharyngeal flora.  Sodium  134, potassium 4.8, chloride 102, CO2 27, glucose 169, BUN 23, creatinine  0.9, calcium 8.4.  Wbc 8.9, hemoglobin 13.2, hematocrit 38.0, platelets 181.  AST 19, ALT 26, ALP 20, total bilirubin 0.6.   Radiographic data:  Chest x-ray shows no evidence of acute abnormality.   HOSPITAL COURSE BY DISCHARGE DIAGNOSIS:  #1 - ACUTE EXACERBATION OF  ASTHMATIC BRONCHITIS.  She was admitted and treated with the usual  pharmaceutical interventions of steroids, IV antibiotics, nebulized  bronchodilators.  She reached maximal hospital benefit by May 16, 2002 and  was ready for discharge home after having become less bronchospastic.   #2 - GASTROESOPHAGEAL  REFLUX DISEASE.  Treated with continuation of proton  pump inhibitors.   #3 - MORBID OBESITY.  Her weight was noted to be 230 pounds.  She was given  instructions on weight loss.   #4 - STEROID-INDUCED HYPERGLYCEMIA.  Her steroids were decreased.  Her  glucose went from 99 to 199.  Therefore, steroids were decreased.  Her  Avandia was increased from 4 mg to 8 mg a day while in the hospital and  returned to 4 mg a day post discharge.   #5 - MULTIPLE SCLEROSIS.  No change.   #6 - HYPERTENSION.  Controlled on current medications.   DISCHARGE MEDICATIONS:  1. Clonidine 0.2 mg b.i.d.  2. Synthroid 0.15 mg daily.  3. Lorazepam 1 mg b.i.d.  4. Singulair 10 mg daily.  5. Flexeril 10 mg b.i.d.  6. Nasonex two puffs b.i.d.  7. Serevent two puffs b.i.d.  8. Protonix 40 mg b.i.d.  9. Pulmicort four puffs b.i.d.  10.      Topamax 100 mg daily.  11.  Effexor XR 75 mg daily.  12.      Avandia 4 mg daily.  13.      Cozaar 50 mg daily.  14.      Prednisone on taper with 10 mg tablets:  40 mg for four days, 30 mg     for four days, 20 mg for four days, 10 mg a day and then stay.  15.      Septra 250 mg one tablet b.i.d. until complete.   DIET:  Low salt/low fat/ no concentrated sweets.   SPECIAL INSTRUCTIONS:  To follow up with Brett Canales Minor, N.P. in one week.  Call [phone].   DISPOSITION/CONDITION ON DISCHARGE:  Improved.     Brett Canales Minor, A.C.N.P. [facility]                 Shan Levans, M.D. [facility]    SM/MEDQ  D:  05/16/2002  T:  05/16/2002  Job:  [id number]

## 2010-06-24 NOTE — Assessment & Plan Note (Signed)
Ana Nunez                               PULMONARY OFFICE NOTE   Ana Nunez, Ana Nunez                         MRN:          [id number]  DATE:12/04/2005                            DOB:          06-25-1929    [name] returns today in follow up.  This 77 year old white female with  history of extrinsic asthma, chronic obstructive lung disease, exsmoker from  40-pack-year smoking, vocal cord dysfunction, reflux disease and obesity.  She is still having wheezing with activity and shortness of breath that  continues.  The patient maintains Singulair 10 mg daily, Serevent 1 spray  b.i.d., Spiriva daily, Pulmicort 2 sprays b.i.d., Protonix 40 mg b.i.d.  Other maintenance medicines have been unchanged.   On exam, temperature 97, blood pressure 140/80, pulse 99, saturation 96% on  room air.  Chest showed no active wheezing. There was audible pseudowheeze  in the upper airways.  No rhonchi, no rales noted.  Cardiac exam showed a  regular rate and rhythm that without S3.  Normal S1-S2.  Abdomen was soft  and nontender.  Extremities showed no edema or clubbing.  Skin was clear.   IMPRESSION:  1. Moderate persistent asthma.  2. Chronic obstructive lung disease.  3. Vocal cord dysfunction syndrome.  4. Reflux disease.   Plan for the patient is to maintain inhaled medicines as currently dosed and  flu vaccine was administered.  We will see the patient back in follow up in  two months.     Charlcie Cradle Delford Field, MD, [facility]    PEW/MedQ  DD: 12/05/2005  DT: 12/05/2005  Job #: [id number]   cc:   Arta Silence, MD

## 2010-06-24 NOTE — Assessment & Plan Note (Signed)
[facility]                             PULMONARY OFFICE NOTE   TARANEH, METHENEY                         MRN:          [id number]  DATE:02/26/2006                            DOB:          May 16, 1929    Ms. Schlotzhauer comes in today with increased shortness of breath, wheezing,  cough, increased sinus drainage.  She is coughing up thick yellow mucus,  been dyspneic for a week.  She maintains:  1. Cozaar 50 mg b.i.d.  2. Clonidine 0.2 mg b.i.d.  3. Synthroid 0.137 mg daily.  4. Singulair 10 mg daily.  5. Protonix 40 mg b.i.d.  6. Zetia 10 mg daily.  7. Spiriva two puffs daily.  8. Effexor 75 mg daily.  9. Serevent one spray b.i.d.  10.Pulmicort two sprays b.i.d.  11.Avandia 4 mg daily.  12.Aspirin 81 mg daily.  13.Magnesium daily.  14.Glucosamine daily.  15.Calcium with Vitamin D b.i.d.  16.Fish oil daily.   EXAM:  Temp was 97, blood pressure 136/84, pulse 99, saturation 96% on  room air.  CHEST:  Inspiratory and expiratory wheeze with very poor air flow.  CARDIAC:  A regular rate and rhythm without S3, normal S1, S2.  ABDOMEN:  Soft, nontender, obese.  EXTREMITIES:  No edema or clubbing.  Skin was clear.  NARES:  Mild nasal inflammation, no purulence.   IMPRESSION:  1. Asthmatic bronchitic flare with acute bronchitis.  2. Ongoing reflux disease.   PLAN:  1. The patient to receive prednisone 40 mg a day, taper down by 10 mg      every 3 days until off.  2. Avelox 400 mg a day for 5 days.  3. She is to discontinue fish oil.  4. Maintain albuterol as needed.  5. Maintain Serevent, Pulmicort, Spiriva as is.  6. Will see the patient back in return followup in 3 weeks.     Charlcie Cradle Delford Field, MD, [facility]  Electronically Signed    PEW/MedQ  DD: 02/26/2006  DT: 02/26/2006  Job #: [id number]   cc:   Arta Silence, MD

## 2010-06-24 NOTE — Op Note (Signed)
Ana Nunez, Ana Nunez                  ACCOUNT NO.:  [id number]   MEDICAL RECORD NO.:  [id number]          PATIENT TYPE:  AMB   LOCATION:  DSC                          FACILITY:  [facility]   PHYSICIAN:  Rose Phi. Maple Hudson, M.D.   DATE OF BIRTH:  05/19/1929   DATE OF PROCEDURE:  02/17/2004  DATE OF DISCHARGE:                                 OPERATIVE REPORT   PREOPERATIVE DIAGNOSIS:  Abnormal right breast mammogram with  microcalcifications.   POSTOPERATIVE DIAGNOSIS:  Abnormal right breast mammogram with  microcalcifications.   OPERATION:  Right breast biopsy with needle localization and specimen  mammogram.   SURGEON:  Dr. Francina Ames.   ANESTHESIA:  MAC.   OPERATIVE PROCEDURE:  Prior to coming to the operating room, a localizing  wire had been placed in the lateral part of the right breast at about the 9  o'clock position through the area of suspicious microcalcifications.   The patient was placed on the operating table with the arms extended on the  arm board and the right breast prepped and draped in the usual fashion.  The  subcutaneous tissue was inverted fairly laterally in the 9 o'clock position,  the radial incision was outlined and the area thoroughly infiltrated with  the local anesthetic mixture.  The incision was made with the wire exposed  and a wide excision of the wire and surrounding tissue was carried out.   Specimen mammogram confirmed the removal of the microcalcifications.   With good hemostasis obtained by the cautery, the incision was closed with 3-  0 Vicryl and subcuticular 4-0 Monocryl and Steri-Strips.   Dressing applied and the patient transferred to the recovery room in  satisfactory condition having tolerated the procedure well.      Pete   PRY/MEDQ  D:  02/17/2004  T:  02/17/2004  Job:  [id number]

## 2010-06-24 NOTE — Discharge Summary (Signed)
[facility]  Patient:    Ana Nunez, Ana Nunez Visit Number: [id number] MRN: [id number]          Service Type: MED Location: 3W [phone] Attending Physician:  Caleb Popp Dictated by:   Earley Favor, RN, MSN, ACNP Admit Date:  06/13/2001 Discharge Date: 06/19/2001                             Discharge Summary  DATE OF BIRTH:  Jul 18, 1929  DISCHARGE DIAGNOSES: 1. Asthmatic bronchitic flare. 2. Gastroesophageal reflux disease. 3. Steroid induced hyperglycemia.  HISTORY OF PRESENT ILLNESS:  Ana Nunez is a complex 77 year old white female patient of Dr. Shan Levans.  She presents with extrinsic asthma, questionable vocal cord dysfunction with gastroesophageal reflux disease, and increase in steroids recently.  She had an ENT evaluation negative for vocal cord dysfunction.  There is a questionable tracheal obstruction versus persistent air flow obstruction that increases asthmatic flare.  Due to the fact that she is refractory to outpatient care, she is being admitted for further evaluation and treatment consistent with fiberoptic bronchoscopy along with cultures.  LABORATORY DATA:  Sodium 134, potassium 4.0, chloride 102, CO2 27, glucose 227, BUN 23, creatinine 1.1, calcium 8.9.  WBC 12.9, hemoglobin 14.7, hematocrit 42.4, platelets are 269,000.  Arterial blood gas on one liter nasal cannula, pH 7.39, pCO2 38, pO2 of 95.  Radiographic data:  Chest two views demonstrates no active disease.  CT of the sinuses shows mild mucosal thickening of the left maxillary sinus as well as right ethmoids and no air fluid level.  There are postoperative changes.  PROCEDURES: Fiberoptic bronchoscopy on Jun 17, 2001, by Dr. Shan Levans. Preoperative medications consisted of fentanyl and Versed. Complications none. Specimens obtained were none.  There was a question of mucous plug secondary to asthmatic bronchitic flare without evidence of upper  airway obstruction or significant vocal cord dysfunction.  Recommendations were to continue patient on present inpatient hospital therapy including high dose IV steroids.  HOSPITAL COURSE: #1 - ASTHMATIC BRONCHITIC FLARE:  She was placed on the usual pharmaceutical regimen consisting of IV steroids and IV antibiotics along with nebulized bronchodilators.  She reached maximal hospital benefit after having a fiberoptic bronchoscopy which demonstrated mucous plug.  She was discharged home in improved condition on Jun 19, 2001, on a prednisone taper along with continuation of anti-microbial therapy with 10-14 days of anti-microbial therapy.  #2 - GASTROESOPHAGEAL REFLUX DISEASE:  This was felt to be an important component of her asthmatic bronchitic flare.  She was continued on proton pump inhibitors on a b.i.d. basis.  #3 - STEROID INDUCED HYPOGLYCEMIA:  Glucose reached peak of 227.  She was placed on Avandia 4 mg q.d. to be titrated based on whether or not her capillary blood glucoses were greater or less than 110.  She will hold her medicine for her glucose less than 110.  #4 - OTHER MEDICAL PROBLEMS:  Her other medical problems are multiple sclerosis, stress incontinence, hypothyroidism, transient ischemic attacks essentially benign at this time.  A long discussion was held with her about the use of over-the-counter herbal supplements consistent of ginkgo biloba, and [employer].  There was some concern that these may all be reacting with all of her medications.  It was suggested that maybe she try not to utilize these medications until her pulmonary status is improved.  DISCHARGE MEDICATIONS:  1. Prednisone taper, 50 mg for  four days, 40 mg for four days,     30 mg for four days, 10 mg a day and then stay.  2. Tussionex one tsp b.i.d. p.r.n. for cough.  3. Septra 250 mg one twice a day.  4. Avandia 4 mg q.a.m. for blood glucose greater than 110.  5. Norvasc 10 mg q.d.  6.  Cozaar 50 mg b.i.d.  7. Catapres 0.2 mg q.d.  8. Singulair 10 mg q.d.  9. Nasonex two sprays q.d. 10. Neurontin 300 mg t.i.d. 11. Protonics 40 mg b.i.d. 12. Flexeril 10 mg q.h.s. 13. Synthroid 175 mcg q.d. 14. Albuterol nebulizer p.r.n. two puffs b.i.d. 15. Vitamins as before.  DIET:  Low salt, low fat diet.  DISCHARGE INSTRUCTIONS:  Call for any problems.  She has a followup appointment with Dr. Shan Levans on Jun 27, 2001, at 11 oclock.  DISPOSITION:  She is being discharged home in improved condition. Dictated by:   Earley Favor, RN, MSN, ACNP Attending Physician:  Caleb Popp DD:  06/19/01 TD:  06/19/01 Job: [id number]

## 2010-06-24 NOTE — Assessment & Plan Note (Signed)
[facility]                             PULMONARY OFFICE NOTE   NEALY, HICKMON                         MRN:          [id number]  DATE:05/02/2006                            DOB:          [birth year]    Ana Nunez returns today in follow-up, is actually back a bit sooner than  expected, doing well.  Has stable moderate persistent asthma, COPD,  vocal cord dysfunction, and reflux disease.  Her maintenance medicines  are unchanged from the last visit.  See the previous note in February.   PHYSICAL EXAMINATION:  VITAL SIGNS:  Temperature 97, blood pressure  118/78, pulse 89, saturation was 98% on room air.  CHEST:  Clear without evidence of wheeze, rales or rhonchi.  There was  some degree of pseudo-wheeze noted.  CARDIAC:  A regular rate and rhythm without S3, normal S1, S2.  ABDOMEN:  Soft, nontender.  EXTREMITIES:  No edema or clubbing.  SKIN:  Clear.  NEUROLOGIC:  Intact.  HEENT:  No jugular venous distention or lymphadenopathy.  Oropharynx  clear.  NECK:  Supple.   IMPRESSION:  1. Moderate persistent asthma.  2. Chronic obstructive lung disease.  3. Vocal cord dysfunction.   PLAN:  Maintain inhaled medicines as currently dosed.  No change in plan  of care is made.  We will see the patient back in 3 months.     Charlcie Cradle Delford Field, MD, [facility]  Electronically Signed    PEW/MedQ  DD: 05/02/2006  DT: 05/02/2006  Job #: [id number]   cc:   Arta Silence, MD

## 2010-06-29 ENCOUNTER — Other Ambulatory Visit: Payer: Self-pay | Admitting: *Deleted

## 2010-06-29 ENCOUNTER — Ambulatory Visit (INDEPENDENT_AMBULATORY_CARE_PROVIDER_SITE_OTHER): Payer: Medicare Other | Admitting: Family Medicine

## 2010-06-29 ENCOUNTER — Encounter: Payer: Self-pay | Admitting: Family Medicine

## 2010-06-29 DIAGNOSIS — R32 Unspecified urinary incontinence: Secondary | ICD-10-CM

## 2010-06-29 DIAGNOSIS — J449 Chronic obstructive pulmonary disease, unspecified: Secondary | ICD-10-CM

## 2010-06-29 DIAGNOSIS — E039 Hypothyroidism, unspecified: Secondary | ICD-10-CM

## 2010-06-29 DIAGNOSIS — J4489 Other specified chronic obstructive pulmonary disease: Secondary | ICD-10-CM

## 2010-06-29 DIAGNOSIS — C50919 Malignant neoplasm of unspecified site of unspecified female breast: Secondary | ICD-10-CM

## 2010-06-29 DIAGNOSIS — Z0000 Encounter for general adult medical examination without abnormal findings: Secondary | ICD-10-CM

## 2010-06-29 DIAGNOSIS — I10 Essential (primary) hypertension: Secondary | ICD-10-CM

## 2010-06-29 DIAGNOSIS — Z0110 Encounter for examination of ears and hearing without abnormal findings: Secondary | ICD-10-CM

## 2010-06-29 DIAGNOSIS — Z13220 Encounter for screening for lipoid disorders: Secondary | ICD-10-CM

## 2010-06-29 DIAGNOSIS — Z136 Encounter for screening for cardiovascular disorders: Secondary | ICD-10-CM

## 2010-06-29 DIAGNOSIS — E119 Type 2 diabetes mellitus without complications: Secondary | ICD-10-CM

## 2010-06-29 DIAGNOSIS — G35 Multiple sclerosis: Secondary | ICD-10-CM

## 2010-06-29 LAB — LIPID PANEL
HDL: 59.8 mg/dL (ref >39.00)
LDL Cholesterol: 71 mg/dL (ref 0–99)
Total CHOL/HDL Ratio: 3
VLDL: 39.6 mg/dL (ref 0.0–40.0)

## 2010-06-29 LAB — BASIC METABOLIC PANEL
Calcium: 9 mg/dL (ref 8.4–10.5)
GFR: 46.09 mL/min — ABNORMAL LOW (ref >60.00)
Potassium: 4.8 mEq/L (ref 3.5–5.1)
Sodium: 138 mEq/L (ref 135–145)

## 2010-06-29 MED ORDER — LOSARTAN POTASSIUM 50 MG PO TABS: ORAL_TABLET | ORAL | Status: DC

## 2010-06-29 MED ORDER — HYDROCODONE-ACETAMINOPHEN 5-500 MG PO TABS: ORAL_TABLET | ORAL | Status: DC

## 2010-06-29 MED ORDER — CLONIDINE HCL 0.3 MG PO TABS: 0.3000 mg | ORAL_TABLET | Freq: Two times a day (BID) | ORAL | Status: DC

## 2010-06-29 MED ORDER — FAMOTIDINE 20 MG PO TABS: 20.0000 mg | ORAL_TABLET | Freq: Every day | ORAL | Status: DC

## 2010-06-29 MED ORDER — VENLAFAXINE HCL 75 MG PO TABS: 75.0000 mg | ORAL_TABLET | Freq: Every day | ORAL | Status: DC

## 2010-06-29 MED ORDER — FUROSEMIDE 20 MG PO TABS: ORAL_TABLET | ORAL | Status: DC

## 2010-06-29 MED ORDER — LEVOTHYROXINE SODIUM 137 MCG PO TABS: 137.0000 ug | ORAL_TABLET | Freq: Every day | ORAL | Status: DC

## 2010-06-29 MED ORDER — METFORMIN HCL ER (MOD) 500 MG PO TB24: ORAL_TABLET | ORAL | Status: DC

## 2010-06-29 MED ORDER — METFORMIN HCL ER (MOD) 500 MG PO TB24: 500.0000 mg | ORAL_TABLET | Freq: Every day | ORAL | Status: DC

## 2010-06-29 NOTE — Assessment & Plan Note (Signed)
Stable

## 2010-06-29 NOTE — Assessment & Plan Note (Signed)
Recheck TSH today.  

## 2010-06-29 NOTE — Progress Notes (Signed)
Here for [name] visit with daughter, Ana Nunez.    I have personally reviewed the Medicare Annual Wellness questionnaire and have noted 1. The patient's medical and social history  COPD- followed by pulmonary, most recently last month. Overall, stable.  DM- only taking Metformin 500 mg daily.  Does not check her CBGs. Does endorse increased thirst lately. Lab Results  Component Value Date   HGBA1C 7.4* 02/24/2009    MS-  Saw [name] last week. No rx. Is developing mild dementia.  Will follow up with [name] in 6 months.  Hypothyroidism- on levothyroxine 147 mcg daily. Denies any symptoms of hypo or hyperthyroidism.   Past Medical History  Diagnosis Date  . [name]   . Fatty liver   . GERD (gastroesophageal reflux disease)   . Type II or unspecified type diabetes mellitus without mention of complication, not stated as uncontrolled   . Positive PPD   . Abnormal involuntary movements   . Hypercholesteremia   . Hypothyroidism   . Unspecified urinary incontinence   . Adenocarcinoma, breast   . Vocal cord dysfunction   . Multiple sclerosis   . Hypertension   . Depression   . CAD (coronary artery disease)   . COPD (chronic obstructive pulmonary disease)   . Asthma   . Community acquired pneumonia      Family History  Problem Relation Age of Onset  . Coronary artery disease Father   . Pancreatic cancer Mother   . Emphysema Mother   . Diabetes Mother   . Pancreatic cancer Sister   . Lymphoma Sister   . Diabetes Sister   . Thyroid cancer Daughter   . Hypertension Sister   . Hypertension Brother   . Liver cancer Sister   . Cirrhosis Sister      History   Social History  . Marital Status: Married    Spouse Name: N/A    Number of Children: N/A  . Years of Education: N/A   Occupational History  . Housewife     former nurse     2. Their use of alcohol, tobacco or illicit drugs- quit smoking 02/1984 3. Their current medications and supplements 4. The patient's  functional ability including ADL's, fall risks, home safety risks and hearing or visual             Impairment.- dependent for most AIDLs. 5. Diet and physical activities-not very physically active due to MS and COPD. 6. Evidence for depression or mood disorders-none  Review of Systems See HPI  Constitutional:   No  weight loss, night sweats,  Fevers, chills, fatigue, lassitude.  CV:  No chest pain,  Orthopnea, PND, swelling in lower extremities, anasarca, dizziness, palpitations  GI  No heartburn, indigestion, abdominal pain, nausea, vomiting, diarrhea, change in bowel habits, loss of appetite  Resp: Notes  shortness of breath with exertion not    at rest.  Notes some  mucus, notes  productive cough,  Notes  non-productive cough,  No coughing up of blood.  No major change in color of mucus.  No wheezing.  No chest wall deformity  Skin: no rash or lesions.  GU: no dysuria, change in color of urine,  No flank pain.  MS:  No joint pain or swelling.  No decreased range of motion.  No back pain.  Psych:  No change in mood or affect. No depression or anxiety.  No memory loss.     Objective:   Physical Exam BP 140/80  Pulse 92  Temp(Src) 97.8 F (36.6 C) (Oral)  Wt 242 lb (109.77 kg)  SpO2 95%  Gen: Pleasant, well-nourished, in no distress,  normal affect  ENT: No lesions,  mouth clear,  oropharynx clear, no postnasal drip  Neck: No JVD, no TMG, no carotid bruits  Lungs: No use of accessory muscles, no dullness to percussion, clear without rales or rhonchi + upper airway sounds  Cardiovascular: RRR, heart sounds normal, no murmur or gallops, no peripheral edema  Abdomen: soft and NT, no HSM,  BS normal  Musculoskeletal: No deformities, no cyanosis or clubbing  Neuro: alert, non focal  Skin: Warm, no lesions or rashes  Ext:  No edema

## 2010-06-29 NOTE — Assessment & Plan Note (Signed)
The patients weight, height, BMI and visual acuity have been recorded in the chart I have made referrals, counseling and provided education to the patient based review of the above and I have provided the pt with a written personalized care plan for preventive services.  

## 2010-06-29 NOTE — Assessment & Plan Note (Signed)
Neg mammogram 6 months ago.

## 2010-06-29 NOTE — Telephone Encounter (Signed)
Rx mailed to patient's home address.

## 2010-06-29 NOTE — Assessment & Plan Note (Signed)
Deteriorated. Recheck a1c today.

## 2010-07-06 ENCOUNTER — Other Ambulatory Visit: Payer: Self-pay | Admitting: *Deleted

## 2010-07-06 ENCOUNTER — Telehealth: Payer: Self-pay | Admitting: Critical Care Medicine

## 2010-07-06 MED ORDER — BENZONATATE 200 MG PO CAPS: 200.0000 mg | ORAL_CAPSULE | Freq: Three times a day (TID) | ORAL | Status: DC | PRN

## 2010-07-06 MED ORDER — METFORMIN HCL ER (MOD) 500 MG PO TB24: ORAL_TABLET | ORAL | Status: DC

## 2010-07-06 NOTE — Telephone Encounter (Signed)
Refills sent. Pt aware.  Jennifer Castillo, CMA  

## 2010-07-06 NOTE — Telephone Encounter (Signed)
Called spoke with pt's daughter, [name].  Verified the medications and dosages that pt is requesting be sent to [facility]: protonix 40mg  bid, spiriva qd, proventil hfa.  Pt/daughter is also requesting refills be sent to [facility] on tessalon.  Dr. Delford Field, please advise if okay to send 90 day supply of the tessalon for pt and number of refills, if any.  Thanks.  Last ov: 4.12.12.

## 2010-07-06 NOTE — Telephone Encounter (Signed)
Refill sent to [facility]

## 2010-07-06 NOTE — Telephone Encounter (Signed)
This is ok

## 2010-07-07 ENCOUNTER — Other Ambulatory Visit: Payer: Self-pay | Admitting: Neurology

## 2010-07-07 DIAGNOSIS — R413 Other amnesia: Secondary | ICD-10-CM

## 2010-07-07 DIAGNOSIS — M531 Cervicobrachial syndrome: Secondary | ICD-10-CM

## 2010-07-13 ENCOUNTER — Telehealth: Payer: Self-pay | Admitting: *Deleted

## 2010-07-13 NOTE — Telephone Encounter (Signed)
[facility] has faxed a form asking for clarification on script for venlafaxine, form is on your desk.

## 2010-07-14 NOTE — Telephone Encounter (Signed)
On my desk

## 2010-07-15 ENCOUNTER — Ambulatory Visit
Admission: RE | Admit: 2010-07-15 | Discharge: 2010-07-15 | Disposition: A | Discharge status: Home / Self Care | Payer: Medicare Other | Source: Ambulatory Visit | Attending: Neurology | Admitting: Neurology

## 2010-07-15 DIAGNOSIS — R413 Other amnesia: Secondary | ICD-10-CM

## 2010-07-15 DIAGNOSIS — M531 Cervicobrachial syndrome: Secondary | ICD-10-CM

## 2010-07-15 MED ORDER — GADOBENATE DIMEGLUMINE 529 MG/ML IV SOLN
10.0000 mL | Freq: Once | INTRAVENOUS | Status: AC | PRN
  Administered 2010-07-15: 10 mL via INTRAVENOUS

## 2010-07-19 ENCOUNTER — Telehealth: Payer: Self-pay | Admitting: Critical Care Medicine

## 2010-07-19 ENCOUNTER — Other Ambulatory Visit: Payer: Self-pay | Admitting: *Deleted

## 2010-07-19 MED ORDER — METFORMIN HCL ER (MOD) 500 MG PO TB24: ORAL_TABLET | ORAL | Status: DC

## 2010-07-19 MED ORDER — ALBUTEROL SULFATE HFA 108 (90 BASE) MCG/ACT IN AERS: 2.0000 | INHALATION_SPRAY | Freq: Four times a day (QID) | RESPIRATORY_TRACT | Status: DC | PRN

## 2010-07-19 MED ORDER — PANTOPRAZOLE SODIUM 40 MG PO TBEC: DELAYED_RELEASE_TABLET | ORAL | Status: DC

## 2010-07-19 MED ORDER — TIOTROPIUM BROMIDE MONOHYDRATE 18 MCG IN CAPS: 18.0000 ug | ORAL_CAPSULE | Freq: Every day | RESPIRATORY_TRACT | Status: DC

## 2010-07-19 NOTE — Telephone Encounter (Signed)
Spoke with pt's daughter. She states that [facility] only received rx for tessalon. Looks like this was the only med that was sent when she called on 5.30.12. I advised we will sent in rxs for proventil, protonix, and spiriva to [facility] now. She verbalized understanding.

## 2010-07-21 ENCOUNTER — Ambulatory Visit: Payer: Self-pay | Admitting: Ophthalmology

## 2010-07-21 DIAGNOSIS — I119 Hypertensive heart disease without heart failure: Secondary | ICD-10-CM

## 2010-07-25 ENCOUNTER — Ambulatory Visit: Payer: Self-pay | Admitting: Ophthalmology

## 2010-07-27 ENCOUNTER — Telehealth: Payer: Self-pay | Admitting: *Deleted

## 2010-07-27 NOTE — Telephone Encounter (Signed)
Fax from [facility] for new rx for generic metformin is on your desk.

## 2010-07-28 NOTE — Telephone Encounter (Signed)
On my desk

## 2010-07-28 NOTE — Telephone Encounter (Signed)
Form faxed back to [facility] at [phone].

## 2010-11-21 LAB — TSH: TSH: 0.479

## 2010-11-21 LAB — BASIC METABOLIC PANEL
BUN: 26 — ABNORMAL HIGH
BUN: 40 — ABNORMAL HIGH
BUN: 30 — ABNORMAL HIGH
BUN: 35 — ABNORMAL HIGH
CO2: 23
CO2: 23
CO2: 27
CO2: 29
CO2: 23
CO2: 25
Calcium: 8.1 — ABNORMAL LOW
Calcium: 8.6
Calcium: 8.5
Chloride: 101
Chloride: 94 — ABNORMAL LOW
Chloride: 97
Chloride: 91 — ABNORMAL LOW
Chloride: 92 — ABNORMAL LOW
Creatinine, Ser: 0.88
Creatinine, Ser: 0.92
Creatinine, Ser: 1.06
Creatinine, Ser: 1.1
Creatinine, Ser: 1.23 — ABNORMAL HIGH
Creatinine, Ser: 1.32 — ABNORMAL HIGH
Creatinine, Ser: 1.45 — ABNORMAL HIGH
GFR calc Af Amer: 52 — ABNORMAL LOW
GFR calc Af Amer: 59 — ABNORMAL LOW
GFR calc Af Amer: >60
GFR calc Af Amer: 48 — ABNORMAL LOW
GFR calc non Af Amer: 49 — ABNORMAL LOW
Glucose, Bld: 81
Potassium: 4.8
Potassium: 4.3
Sodium: 135
Sodium: 135

## 2010-11-21 LAB — CULTURE, BLOOD (ROUTINE X 2): Culture: NO GROWTH

## 2010-11-21 LAB — DIFFERENTIAL
Basophils Absolute: 0
Basophils Relative: 0
Basophils Relative: 0
Eosinophils Absolute: 0
Monocytes Absolute: 0.8 — ABNORMAL HIGH
Monocytes Relative: 5
Monocytes Relative: 6
Neutro Abs: 13.9 — ABNORMAL HIGH
Neutro Abs: 9.4 — ABNORMAL HIGH
Neutrophils Relative %: 85 — ABNORMAL HIGH

## 2010-11-21 LAB — BLOOD GAS, ARTERIAL
Acid-base deficit: 1
Bicarbonate: 22.4
O2 Saturation: 94.2
Patient temperature: 37
TCO2: 20.7
pO2, Arterial: 70.3 — ABNORMAL LOW

## 2010-11-21 LAB — CBC
HCT: 30.9 — ABNORMAL LOW
HCT: 30.4 — ABNORMAL LOW
Hemoglobin: 10.3 — ABNORMAL LOW
Hemoglobin: 10.7 — ABNORMAL LOW
Hemoglobin: 11.3 — ABNORMAL LOW
MCHC: 34.2
MCHC: 34.6
MCHC: 34.7
MCHC: 34.7
MCHC: 35.3
MCV: 96.3
MCV: 96.6
MCV: 97.2
MCV: 95.8
MCV: 96.3
Platelets: 295
Platelets: 195
Platelets: 214
RBC: 3.07 — ABNORMAL LOW
RBC: 3.2 — ABNORMAL LOW
RBC: 3.35 — ABNORMAL LOW
RBC: 3.16 — ABNORMAL LOW
RDW: 15.1 — ABNORMAL HIGH
RDW: 15.3 — ABNORMAL HIGH
RDW: 15.3 — ABNORMAL HIGH
RDW: 14.8 — ABNORMAL HIGH
WBC: 11.4 — ABNORMAL HIGH

## 2010-11-21 LAB — COMPREHENSIVE METABOLIC PANEL
ALT: 84 — ABNORMAL HIGH
Albumin: 2.3 — ABNORMAL LOW
Alkaline Phosphatase: 84
BUN: 30 — ABNORMAL HIGH
Potassium: 4.7
Sodium: 134 — ABNORMAL LOW
Total Protein: 5.5 — ABNORMAL LOW

## 2010-11-21 LAB — B-NATRIURETIC PEPTIDE (CONVERTED LAB): Pro B Natriuretic peptide (BNP): 198 — ABNORMAL HIGH

## 2011-02-10 ENCOUNTER — Other Ambulatory Visit: Payer: Self-pay | Admitting: Family Medicine

## 2011-02-10 DIAGNOSIS — Z9889 Other specified postprocedural states: Secondary | ICD-10-CM

## 2011-02-10 DIAGNOSIS — Z853 Personal history of malignant neoplasm of breast: Secondary | ICD-10-CM

## 2011-02-17 ENCOUNTER — Other Ambulatory Visit: Payer: Self-pay | Admitting: *Deleted

## 2011-02-17 MED ORDER — LEVOTHYROXINE SODIUM 137 MCG PO TABS: 137.0000 ug | ORAL_TABLET | Freq: Every day | ORAL | Status: DC

## 2011-02-17 MED ORDER — METFORMIN HCL ER (MOD) 500 MG PO TB24: ORAL_TABLET | ORAL | Status: DC

## 2011-02-17 MED ORDER — CLONIDINE HCL 0.3 MG PO TABS: 0.3000 mg | ORAL_TABLET | Freq: Two times a day (BID) | ORAL | Status: DC

## 2011-02-17 MED ORDER — HYDROCODONE-ACETAMINOPHEN 5-500 MG PO TABS: ORAL_TABLET | ORAL | Status: DC

## 2011-02-17 MED ORDER — VENLAFAXINE HCL 75 MG PO TABS: 75.0000 mg | ORAL_TABLET | Freq: Every day | ORAL | Status: DC

## 2011-02-17 MED ORDER — FAMOTIDINE 20 MG PO TABS: 20.0000 mg | ORAL_TABLET | Freq: Every day | ORAL | Status: DC

## 2011-02-17 MED ORDER — LOSARTAN POTASSIUM 50 MG PO TABS: ORAL_TABLET | ORAL | Status: DC

## 2011-02-17 NOTE — Telephone Encounter (Signed)
Patient's daughter [name]) requested medication be sent in for [facility] day supply to [facility] and 30 day supply sent to [facility].

## 2011-02-20 MED ORDER — HYDROCODONE-ACETAMINOPHEN 5-500 MG PO TABS: ORAL_TABLET | ORAL | Status: DC

## 2011-02-20 NOTE — Telephone Encounter (Signed)
Rx for Alprazolam faxed to [facility] at [phone].

## 2011-02-20 NOTE — Telephone Encounter (Signed)
Addended by: Gilmer Mor on: 02/20/2011 04:53 PM   Modules accepted: Orders

## 2011-02-20 NOTE — Telephone Encounter (Signed)
Rx that was faxed to [facility] was for Hydrocodone not Alprazolam.

## 2011-03-01 ENCOUNTER — Other Ambulatory Visit: Payer: Self-pay | Admitting: *Deleted

## 2011-03-10 ENCOUNTER — Other Ambulatory Visit: Payer: Self-pay | Admitting: Critical Care Medicine

## 2011-03-10 MED ORDER — FUROSEMIDE 20 MG PO TABS: ORAL_TABLET | ORAL | Status: DC

## 2011-03-10 MED ORDER — ALBUTEROL SULFATE HFA 108 (90 BASE) MCG/ACT IN AERS: 2.0000 | INHALATION_SPRAY | Freq: Four times a day (QID) | RESPIRATORY_TRACT | Status: DC | PRN

## 2011-03-10 MED ORDER — TIOTROPIUM BROMIDE MONOHYDRATE 18 MCG IN CAPS: 18.0000 ug | ORAL_CAPSULE | Freq: Every day | RESPIRATORY_TRACT | Status: AC

## 2011-03-10 MED ORDER — PANTOPRAZOLE SODIUM 40 MG PO TBEC: DELAYED_RELEASE_TABLET | ORAL | Status: DC

## 2011-03-10 MED ORDER — BENZONATATE 200 MG PO CAPS: 200.0000 mg | ORAL_CAPSULE | Freq: Three times a day (TID) | ORAL | Status: DC | PRN

## 2011-03-10 NOTE — Telephone Encounter (Signed)
A letter was received from the pt's daughter stating that the mail order pharmacy has changed to [facility] and new prescriptions need to be sent for Spiriva, Protonix, Lasix, Tessalon Perles and Proventil. Pt needs an appt soon and was last seen 05/2010 to retrun in 4 months. I spoke with [name] and she has scheduled her mother for follow-up in [place] on Thurs., 03/30/11 @ 11:15am. Prescriptions have been sent electronically.

## 2011-03-11 IMAGING — CR DG CHEST 2V
2 series · 2 of 2 positions shown · non-contrast
Comparison: 08/06/2008 and earlier

CLINICAL DATA: Persistent cough/history of pneumonia

CHEST - 2 VIEW

[view not recorded (1 of 2)]
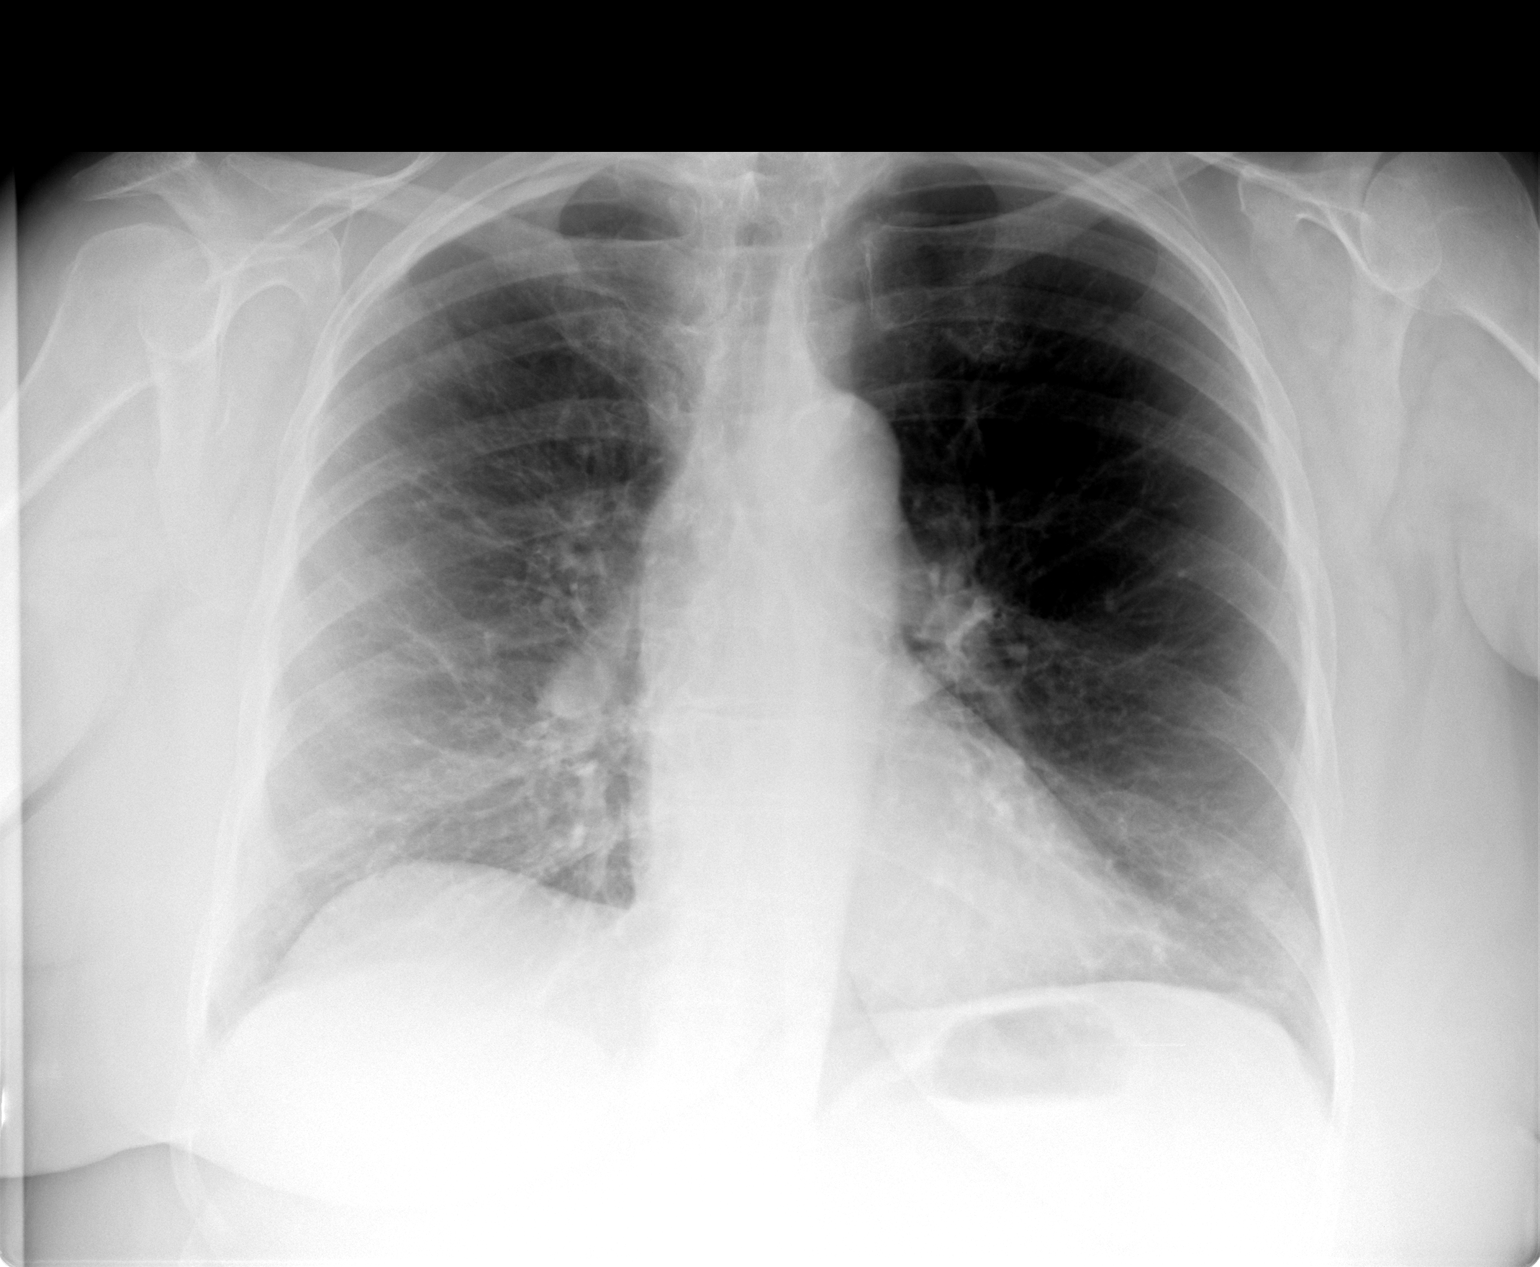

[view not recorded (2 of 2)]
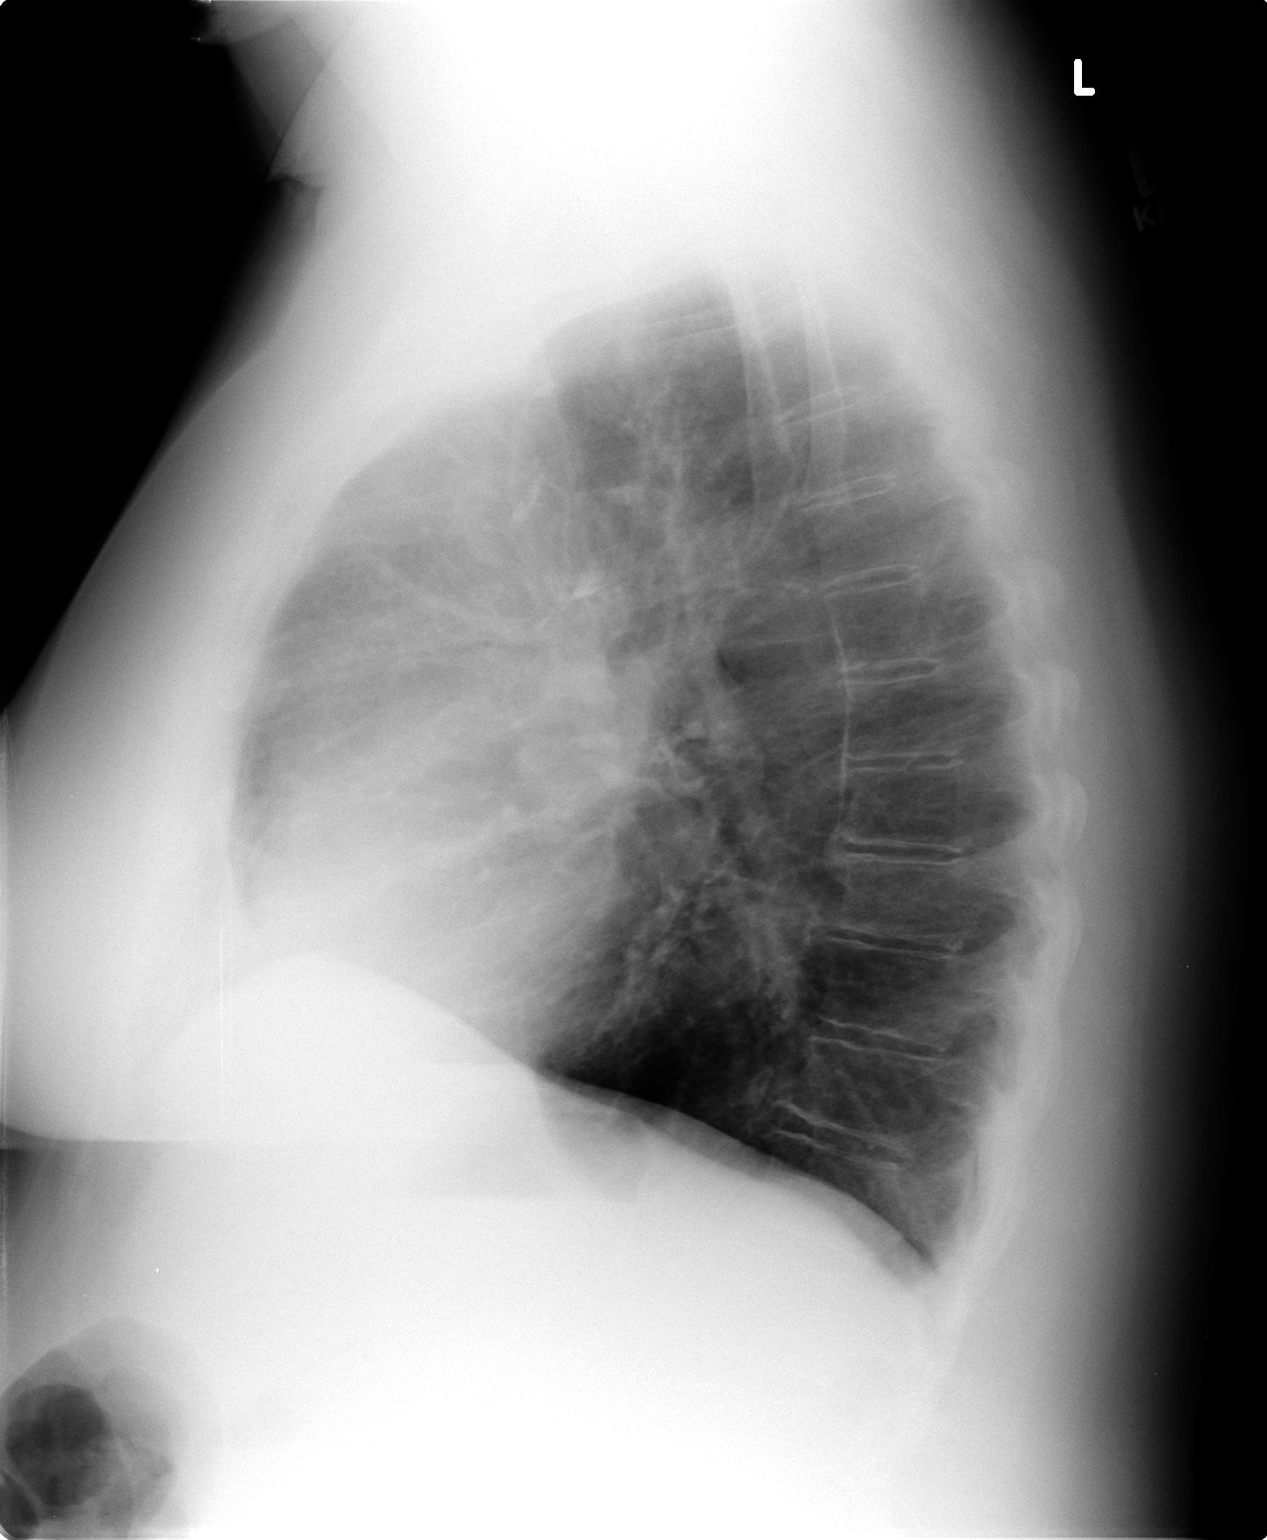

[2 of 2 positions shown; findings below may reference images not displayed]

FINDINGS: Continued improvement with essential clearing of the
lungs bilaterally.  There are chronic changes with hyperaeration,
peribronchial thickening, and some scattered scar like densities.

No pleural fluid or osseous lesions.
IMPRESSION: Chronic changes - currently no active airspace disease.

## 2011-03-13 ENCOUNTER — Ambulatory Visit
Admission: RE | Admit: 2011-03-13 | Discharge: 2011-03-13 | Disposition: A | Discharge status: Home / Self Care | Payer: Medicare Other | Source: Ambulatory Visit | Attending: Family Medicine | Admitting: Family Medicine

## 2011-03-13 ENCOUNTER — Other Ambulatory Visit: Payer: Self-pay | Admitting: Family Medicine

## 2011-03-13 DIAGNOSIS — N6310 Unspecified lump in the right breast, unspecified quadrant: Secondary | ICD-10-CM

## 2011-03-13 DIAGNOSIS — Z853 Personal history of malignant neoplasm of breast: Secondary | ICD-10-CM

## 2011-03-13 DIAGNOSIS — Z9889 Other specified postprocedural states: Secondary | ICD-10-CM

## 2011-03-17 ENCOUNTER — Ambulatory Visit
Admission: RE | Admit: 2011-03-17 | Discharge: 2011-03-17 | Disposition: A | Discharge status: Home / Self Care | Payer: Medicare Other | Source: Ambulatory Visit | Attending: Family Medicine | Admitting: Family Medicine

## 2011-03-17 ENCOUNTER — Other Ambulatory Visit: Payer: Self-pay | Admitting: Family Medicine

## 2011-03-17 DIAGNOSIS — Z853 Personal history of malignant neoplasm of breast: Secondary | ICD-10-CM

## 2011-03-17 DIAGNOSIS — N6310 Unspecified lump in the right breast, unspecified quadrant: Secondary | ICD-10-CM

## 2011-03-20 ENCOUNTER — Other Ambulatory Visit: Payer: Self-pay | Admitting: Family Medicine

## 2011-03-20 ENCOUNTER — Telehealth: Payer: Self-pay | Admitting: Family Medicine

## 2011-03-20 DIAGNOSIS — C50911 Malignant neoplasm of unspecified site of right female breast: Secondary | ICD-10-CM

## 2011-03-20 MED ORDER — ALPRAZOLAM 0.25 MG PO TABS: ORAL_TABLET | ORAL | Status: DC

## 2011-03-20 NOTE — Telephone Encounter (Signed)
I'm so sorry to hear that. Ok to phone in xanax rx as written below.

## 2011-03-20 NOTE — Telephone Encounter (Signed)
Patient had a mammogram and they found breast cancer.  Patient is scheduled for a MRI on Friday 03/24/11, so she will need something oral to relax her for the MRI.  Her daughter would like the prescription sent to the [facility] please.

## 2011-03-20 NOTE — Telephone Encounter (Signed)
Spoke with Ana Nunez and advised her that Rx was sent to pharmacy.

## 2011-03-24 ENCOUNTER — Telehealth: Payer: Self-pay | Admitting: Family Medicine

## 2011-03-24 ENCOUNTER — Ambulatory Visit
Admission: RE | Admit: 2011-03-24 | Discharge: 2011-03-24 | Disposition: A | Discharge status: Home / Self Care | Payer: Medicare Other | Source: Ambulatory Visit | Attending: Family Medicine | Admitting: Family Medicine

## 2011-03-24 DIAGNOSIS — C50911 Malignant neoplasm of unspecified site of right female breast: Secondary | ICD-10-CM

## 2011-03-24 MED ORDER — GADOBENATE DIMEGLUMINE 529 MG/ML IV SOLN
10.0000 mL | Freq: Once | INTRAVENOUS | Status: AC | PRN
  Administered 2011-03-24: 10 mL via INTRAVENOUS

## 2011-03-24 NOTE — Telephone Encounter (Signed)
Please be sure to complete today.  Thanks, [employer]

## 2011-03-24 NOTE — Telephone Encounter (Signed)
Spoke with [facility] and confirmed generic form was okay for patient. They said generic was actually shipped out today and that patient should receive it next week. Advised patient's daughter of same and that if there was any more problems to contact the office. She verbalized understanding.

## 2011-03-29 ENCOUNTER — Other Ambulatory Visit: Payer: Self-pay | Admitting: Family Medicine

## 2011-03-29 DIAGNOSIS — R928 Other abnormal and inconclusive findings on diagnostic imaging of breast: Secondary | ICD-10-CM

## 2011-03-30 ENCOUNTER — Encounter (INDEPENDENT_AMBULATORY_CARE_PROVIDER_SITE_OTHER): Payer: Self-pay | Admitting: Surgery

## 2011-03-30 ENCOUNTER — Ambulatory Visit (INDEPENDENT_AMBULATORY_CARE_PROVIDER_SITE_OTHER): Payer: Medicare Other | Admitting: Surgery

## 2011-03-30 ENCOUNTER — Ambulatory Visit: Payer: Medicare Other | Admitting: Critical Care Medicine

## 2011-03-30 VITALS — BP 132/90 | HR 100 | Temp 97.5°F | Resp 24 | Ht 64.0 in | Wt 239.2 lb

## 2011-03-30 DIAGNOSIS — C50919 Malignant neoplasm of unspecified site of unspecified female breast: Secondary | ICD-10-CM

## 2011-03-30 NOTE — Progress Notes (Signed)
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 Her sentinel node had a micrometastasis. She was treated with five years of tamoxifen. She stopped about a year ago.  A recent evaluation has shown a new mass adjacent to the lumpectomy site. A biopsy showed invasive ductal carcinoma, receptor positive, HER-2 positive. An MRI shows in addition to this area on the right breast a 5 cm area on the left breast very concerning for DCIS. This has not yet been biopsied.  The patient has significant medical comorbidities. She has significant COPD and his wheezing most of the time. She has apparently also multiple sclerosis followed by Dr. Fayrene Fearing LOve for this.   ROS: She multiple medical issues as documented in Epic.  PMH: Past Medical History  Diagnosis Date  . [name]   . Fatty liver   . GERD (gastroesophageal reflux disease)   . Type II or unspecified type diabetes mellitus without mention of complication, not stated as uncontrolled   . Positive PPD   . Abnormal involuntary movements   . Hypercholesteremia   . Hypothyroidism   . Unspecified urinary incontinence   . Vocal cord dysfunction   . Multiple sclerosis   . Hypertension   . Depression   . CAD (coronary artery disease)   . COPD (chronic obstructive pulmonary disease)   . Asthma   . Community acquired pneumonia   . Arthritis   . Adenocarcinoma, breast     Right breast  . Emphysema of lung   . Neuromuscular disorder     MS  . TIA (transient ischemic attack)    PSH: Past Surgical History  Procedure Date  . Appendectomy 1942  . Tonsillectomy 1951  . Dilation and curettage of uterus 1964  . Nasal sinus surgery 1999  . Breast lumpectomy 1/06    Right  . Fracture surgery may 2010    R ankle     MEDS: Current Outpatient Prescriptions   Medication Sig Dispense Refill  . albuterol (PROVENTIL HFA) 108 (90 BASE) MCG/ACT inhaler Inhale 2 puffs into the lungs every 6 (six) hours as needed.  3 Inhaler  1  . albuterol (PROVENTIL) (2.5 MG/3ML) 0.083% nebulizer solution Take 2.5 mg by nebulization every 4 (four) hours as needed.        . ALPRAZolam (XANAX) 0.25 MG tablet 1-2 tablets 30 minutes prior to MRI  6 tablet  0  . aspirin 81 MG tablet Take 81 mg by mouth daily.        . benzonatate (TESSALON) 200 MG capsule Take 1 capsule (200 mg total) by mouth 3 (three) times daily as needed.  100 capsule  1  . Calcium Carbonate-Vitamin D (RA CALCIUM PLUS VITAMIN D) 600-400 MG-UNIT per tablet Take 1 tablet by mouth 2 (two) times daily.        . chlorpheniramine-HYDROcodone (TUSSIONEX PENNKINETIC ER) 10-8 MG/5ML LQCR Take 5 mLs by mouth every 12 (twelve) hours as needed.        . cloNIDine (CATAPRES) 0.3 MG tablet Take 1 tablet (0.3 mg total) by mouth 2 (two) times daily.  180 tablet  3  . cyclobenzaprine (FLEXERIL) 10 MG tablet Twice daily and as needed      . dextromethorphan-guaiFENesin (MUCINEX DM) 30-600 MG per 12 hr tablet Take 1 tablet by mouth 2 (two) times daily as needed.       Marland Kitchen  famotidine (PEPCID) 20 MG tablet Take 1 tablet (20 mg total) by mouth at bedtime.  90 tablet  3  . fesoterodine (TOVIAZ) 4 MG TB24 Take 4 mg by mouth daily.        . furosemide (LASIX) 20 MG tablet Take one tablet by mouth twice daily as needed for edema  180 tablet  1  . HYDROcodone-acetaminophen (VICODIN) 5-500 MG per tablet Take one tablet by mouth twice daily as needed for arthritis  90 tablet  0  . levothyroxine (SYNTHROID, LEVOTHROID) 137 MCG tablet Take 1 tablet (137 mcg total) by mouth daily.  90 tablet  3  . losartan (COZAAR) 50 MG tablet Take one tablet by mouth daily  90 tablet  3  . magnesium oxide (MAG-OX) 400 MG tablet Take 400 mg by mouth daily.        . metFORMIN (GLUMETZA) 500 MG (MOD) 24 hr tablet Take one tablet by mouth twice daily  180  tablet  3  . Multiple Vitamin (MULTIVITAMIN) capsule Take 1 capsule by mouth daily.        . Naproxen Sodium (ALEVE) 220 MG CAPS Take 1 capsule by mouth at bedtime as needed.        . pantoprazole (PROTONIX) 40 MG tablet Take 1 capsule by mouth daily  90 tablet  1  . polyethylene glycol (MIRALAX / GLYCOLAX) packet Take 17 g by mouth as needed.        . Probiotic Product (PROBIOTIC PO) Take 1 capsule by mouth daily. As needed with antibiotics      . tiotropium (SPIRIVA) 18 MCG inhalation capsule Place 1 capsule (18 mcg total) into inhaler and inhale daily.  90 capsule  1  . venlafaxine (EFFEXOR) 75 MG tablet Take 1 tablet (75 mg total) by mouth daily.  90 tablet  3    ALLERGIES:  Allergies  Allergen Reactions  . Avelox (Moxifloxacin Hcl In Nacl) Rash    pruritis     PE General: The patient is alert and seems oriented and aware of the issues. She is dyspneic and wheezing. Lungs: Bilateral wheezes and rhonchi. Heart: Regular rhythm no murmurs rubs or gallops are heard today. Breasts: The right breast has a well-healed transverse lumpectomy scar laterally placed as well as a right transverse axillary scar from the sentinel node. There is some bruising. There is some firmness above the lumpectomy scar but I think this has been chronic. I do not feel a mass. The left breast appears normal to me. Lymphatics: I feel no abnormal axillary or supraclavicular nodes on either side.  Data Reviewed I have reviewed her mammogram films and reports, her pathology reports and slides, and reviewed the MRI report and films. There is fairly an area of concern in the left breast for DCIS measuring 5 cm. The recurrent cancer in the right breast is only 8 mm by MRI.  Assessment Recurrent right breast cancer receptor positive, HER-2 positive, upper outer quadrant Suspicious finding left breast possible DCIS Severe COPD, dyspneic at rest History of MS  Plan Overall, I think she is a poor operative  candidate. Normally a recurrent right breast cancer would require a mastectomy for surgical treatment. We also need to know what is in the left breast. The family reports there have been some mild mental status changes over the last couple of months and apparently her neurologist is planning to order an MRI of the brain. She is not yet scheduled to see her medical oncologist again. She is  scheduled to see her pulmonologist on Monday for evaluation of her COPD.  I think one option here would be to treat her with anti-estrogen therapy. We can do this in lieu of surgery, especially if the left side is not cancer. I am concerned about how well she might do with her general anesthesia and a mastectomy given her other medical comorbidities.  We are going to confirm with the breast center the date for an MRI guided biopsy of the area on the left breast. We will contact the cancer center to give her a new appointment with her medical oncologist so they can help with decision making process. Will also be able to get an opinion from her pulmonologist after she sees him on Monday.  I spent 30 minutes with the family reviewing the pathophysiology of hernia breast cancer and all of the issues with treatment alternatives risks et Karie Soda.

## 2011-03-30 NOTE — Patient Instructions (Signed)
Please contact [name] to arrange the MRI of your brain. We will contact the medical oncology office to have them see you. We will also contact the breast center to arrange the MRI guided biopsy of your breast.

## 2011-03-31 ENCOUNTER — Telehealth: Payer: Self-pay | Admitting: Oncology

## 2011-03-31 ENCOUNTER — Telehealth: Payer: Self-pay | Admitting: *Deleted

## 2011-03-31 NOTE — Telephone Encounter (Signed)
Added new pt appt w/GM for 2/27 @ 10 am per dawn. Dawn to contact pt.

## 2011-03-31 NOTE — Telephone Encounter (Signed)
Spoke to pt concerning appt with Dr. Darnelle Catalan on 2/27 for recurrent breast cancer.  Gave pt date and time.  Received verbal confirmation of appt.  Pt denies further needs or concerns at this time.  Contact information given.

## 2011-03-31 NOTE — Progress Notes (Signed)
Addended byLiliana Cline on: 03/31/2011 08:28 AM   Modules accepted: Orders

## 2011-04-03 ENCOUNTER — Ambulatory Visit (INDEPENDENT_AMBULATORY_CARE_PROVIDER_SITE_OTHER): Payer: Medicare Other | Admitting: Critical Care Medicine

## 2011-04-03 ENCOUNTER — Encounter: Payer: Self-pay | Admitting: Critical Care Medicine

## 2011-04-03 ENCOUNTER — Telehealth: Payer: Self-pay | Admitting: Oncology

## 2011-04-03 DIAGNOSIS — J449 Chronic obstructive pulmonary disease, unspecified: Secondary | ICD-10-CM

## 2011-04-03 NOTE — Progress Notes (Signed)
Subjective:    Patient ID: Ana Nunez, female    DOB: Oct 26, 1929, 78 y.o.   MRN: [id number]  HPI  :  78 y.o.  WF with hx of AB/COPD, VCD , and MS   04/03/2011 Breast CA Dx mammogram pos.  Recurrence from 2005, R  invasive ductal CA.  Small. Mass on L breast now as well. 5cm. X 2 x 1cm Sees Streck. ?bilateral mastectomy.  Sees Magrinat. Had XRT in the past on R. Now more neuro issues, memory issues and balance issues.  ADL issues. MRI is pending. Notes more cough than before and is heavy.  Notes some wheezing.  Is dyspneic with minimal exertion, no endurance.     Past Medical History  Diagnosis Date  . [name]   . Fatty liver   . GERD (gastroesophageal reflux disease)   . Type II or unspecified type diabetes mellitus without mention of complication, not stated as uncontrolled   . Positive PPD   . Abnormal involuntary movements   . Hypercholesteremia   . Hypothyroidism   . Unspecified urinary incontinence   . Vocal cord dysfunction   . Multiple sclerosis   . Hypertension   . Depression   . CAD (coronary artery disease)   . COPD (chronic obstructive pulmonary disease)   . Asthma   . Community acquired pneumonia   . Arthritis   . Adenocarcinoma, breast     Right breast  . Emphysema of lung   . Neuromuscular disorder     MS  . TIA (transient ischemic attack)   . Breast cancer      Family History  Problem Relation Age of Onset  . Coronary artery disease Father   . Heart disease Father   . Pancreatic cancer Mother   . Emphysema Mother   . Diabetes Mother   . COPD Mother   . Pancreatic cancer Sister   . Cancer Sister     pancreatic  . Lymphoma Sister   . Cancer Sister     pancreatic  . Diabetes Sister   . Thyroid cancer Daughter   . Hypertension Sister   . Hypertension Brother   . Cancer Brother     CML  . Liver cancer Sister   . Cirrhosis Sister   . Cancer Maternal Aunt     breast  . Cancer Brother     bladder     History   Social History  .  Marital Status: Married    Spouse Name: N/A    Number of Children: N/A  . Years of Education: N/A   Occupational History  . Housewife     former nurse   Social History Main Topics  . Smoking status: Former Smoker -- 1.5 packs/day for 25 years    Types: Cigarettes    Quit date: 02/07/1984  . Smokeless tobacco: Never Used  . Alcohol Use: Yes     1-2 glaases of wine daily  . Drug Use: No  . Sexually Active: Not on file   Other Topics Concern  . Not on file   Social History Narrative  . No narrative on file     Allergies  Allergen Reactions  . Avelox (Moxifloxacin Hcl In Nacl) Rash    pruritis     Outpatient Prescriptions Prior to Visit  Medication Sig Dispense Refill  . albuterol (PROVENTIL HFA) 108 (90 BASE) MCG/ACT inhaler Inhale 2 puffs into the lungs every 6 (six) hours as needed.  3 Inhaler  1  . albuterol (  PROVENTIL) (2.5 MG/3ML) 0.083% nebulizer solution Take 2.5 mg by nebulization every 4 (four) hours as needed.        Marland Kitchen aspirin 81 MG tablet Take 81 mg by mouth daily.        . benzonatate (TESSALON) 200 MG capsule Take 1 capsule (200 mg total) by mouth 3 (three) times daily as needed.  100 capsule  1  . Calcium Carbonate-Vitamin D (RA CALCIUM PLUS VITAMIN D) 600-400 MG-UNIT per tablet Take 1 tablet by mouth 2 (two) times daily.        . chlorpheniramine-HYDROcodone (TUSSIONEX PENNKINETIC ER) 10-8 MG/5ML LQCR Take 5 mLs by mouth every 12 (twelve) hours as needed.        . cloNIDine (CATAPRES) 0.3 MG tablet Take 1 tablet (0.3 mg total) by mouth 2 (two) times daily.  180 tablet  3  . cyclobenzaprine (FLEXERIL) 10 MG tablet 2 (two) times daily. Twice daily and as needed      . dextromethorphan-guaiFENesin (MUCINEX DM) 30-600 MG per 12 hr tablet Take 1 tablet by mouth 2 (two) times daily as needed.       . famotidine (PEPCID) 20 MG tablet Take 1 tablet (20 mg total) by mouth at bedtime.  90 tablet  3  . fesoterodine (TOVIAZ) 4 MG TB24 Take 4 mg by mouth daily.        Marland Kitchen  HYDROcodone-acetaminophen (VICODIN) 5-500 MG per tablet Take one tablet by mouth twice daily as needed for arthritis  90 tablet  0  . levothyroxine (SYNTHROID, LEVOTHROID) 137 MCG tablet Take 1 tablet (137 mcg total) by mouth daily.  90 tablet  3  . losartan (COZAAR) 50 MG tablet Take one tablet by mouth daily  90 tablet  3  . magnesium oxide (MAG-OX) 400 MG tablet Take 400 mg by mouth daily.        . metFORMIN (GLUMETZA) 500 MG (MOD) 24 hr tablet Take one tablet by mouth twice daily  180 tablet  3  . Multiple Vitamin (MULTIVITAMIN) capsule Take 1 capsule by mouth daily.        . Naproxen Sodium (ALEVE) 220 MG CAPS Take 1 capsule by mouth at bedtime as needed.        . polyethylene glycol (MIRALAX / GLYCOLAX) packet Take 17 g by mouth as needed.        . Probiotic Product (PROBIOTIC PO) Take 1 capsule by mouth daily. As needed with antibiotics      . tiotropium (SPIRIVA) 18 MCG inhalation capsule Place 1 capsule (18 mcg total) into inhaler and inhale daily.  90 capsule  1  . venlafaxine (EFFEXOR) 75 MG tablet Take 1 tablet (75 mg total) by mouth daily.  90 tablet  3  . furosemide (LASIX) 20 MG tablet Take one tablet by mouth twice daily as needed for edema  180 tablet  1  . pantoprazole (PROTONIX) 40 MG tablet Take 1 capsule by mouth daily  90 tablet  1  . ALPRAZolam (XANAX) 0.25 MG tablet 1-2 tablets 30 minutes prior to MRI  6 tablet  0      Review of Systems  Constitutional:   No  weight loss, night sweats,  Fevers, chills, fatigue, lassitude. HEENT:   No headaches,  Difficulty swallowing,  Tooth/dental problems,  Sore throat,                No sneezing, itching, ear ache, nasal congestion, post nasal drip,   CV:  No chest pain,  Orthopnea, PND,  swelling in lower extremities, anasarca, dizziness, palpitations  GI  No heartburn, indigestion, abdominal pain, nausea, vomiting, diarrhea, change in bowel habits, loss of appetite  Resp: Notes  shortness of breath with exertion not    at rest.   Notes some  mucus, notes  productive cough,  Notes  non-productive cough,  No coughing up of blood.  No major change in color of mucus.  No wheezing.  No chest wall deformity  Skin: no rash or lesions.  GU: no dysuria, change in color of urine, no urgency or frequency.  No flank pain.  MS:  No joint pain or swelling.  No decreased range of motion.  No back pain.  Psych:  No change in mood or affect. No depression or anxiety.  No memory loss.     Objective:   Physical Exam BP 136/76  Pulse 91  Temp(Src) 97.6 F (36.4 C) (Oral)  Ht 5\' 4"  (1.626 m)  Wt 208 lb (94.348 kg)  BMI 35.70 kg/m2  SpO2 96%  Gen: Pleasant, well-nourished, in no distress,  normal affect  ENT: No lesions,  mouth clear,  oropharynx clear, no postnasal drip  Neck: No JVD, no TMG, no carotid bruits  Lungs: No use of accessory muscles, no dullness to percussion, distant BS  Cardiovascular: RRR, heart sounds normal, no murmur or gallops, no peripheral edema  Abdomen: soft and NT, no HSM,  BS normal  Musculoskeletal: No deformities, no cyanosis or clubbing  Neuro: alert, non focal  Skin: Warm, no lesions or rashes        Assessment & Plan:   COPD Gold D. COPD with current CAT Score of 31 This patient would be a very poor candidate for bilateral mastectomy at this time the patient is likely going to follow a conservative course Plan Maintain inhaled medications as prescribed Return 6   months     Updated Medication List Outpatient Encounter Prescriptions as of 04/03/2011  Medication Sig Dispense Refill  . albuterol (PROVENTIL HFA) 108 (90 BASE) MCG/ACT inhaler Inhale 2 puffs into the lungs every 6 (six) hours as needed.  3 Inhaler  1  . albuterol (PROVENTIL) (2.5 MG/3ML) 0.083% nebulizer solution Take 2.5 mg by nebulization every 4 (four) hours as needed.        Marland Kitchen aspirin 81 MG tablet Take 81 mg by mouth daily.        . benzonatate (TESSALON) 200 MG capsule Take 1 capsule (200 mg total) by  mouth 3 (three) times daily as needed.  100 capsule  1  . Calcium Carbonate-Vitamin D (RA CALCIUM PLUS VITAMIN D) 600-400 MG-UNIT per tablet Take 1 tablet by mouth 2 (two) times daily.        . chlorpheniramine-HYDROcodone (TUSSIONEX PENNKINETIC ER) 10-8 MG/5ML LQCR Take 5 mLs by mouth every 12 (twelve) hours as needed.        . cloNIDine (CATAPRES) 0.3 MG tablet Take 1 tablet (0.3 mg total) by mouth 2 (two) times daily.  180 tablet  3  . cyclobenzaprine (FLEXERIL) 10 MG tablet 2 (two) times daily. Twice daily and as needed      . dextromethorphan-guaiFENesin (MUCINEX DM) 30-600 MG per 12 hr tablet Take 1 tablet by mouth 2 (two) times daily as needed.       . famotidine (PEPCID) 20 MG tablet Take 1 tablet (20 mg total) by mouth at bedtime.  90 tablet  3  . fesoterodine (TOVIAZ) 4 MG TB24 Take 4 mg by mouth daily.        Marland Kitchen  furosemide (LASIX) 20 MG tablet as needed. Take one tablet by mouth twice daily as needed for edema      . HYDROcodone-acetaminophen (VICODIN) 5-500 MG per tablet Take one tablet by mouth twice daily as needed for arthritis  90 tablet  0  . levothyroxine (SYNTHROID, LEVOTHROID) 137 MCG tablet Take 1 tablet (137 mcg total) by mouth daily.  90 tablet  3  . losartan (COZAAR) 50 MG tablet Take one tablet by mouth daily  90 tablet  3  . magnesium oxide (MAG-OX) 400 MG tablet Take 400 mg by mouth daily.        . metFORMIN (GLUMETZA) 500 MG (MOD) 24 hr tablet Take one tablet by mouth twice daily  180 tablet  3  . Multiple Vitamin (MULTIVITAMIN) capsule Take 1 capsule by mouth daily.        . Naproxen Sodium (ALEVE) 220 MG CAPS Take 1 capsule by mouth at bedtime as needed.        . pantoprazole (PROTONIX) 40 MG tablet 2 (two) times daily. Take 1 capsule by mouth daily      . polyethylene glycol (MIRALAX / GLYCOLAX) packet Take 17 g by mouth as needed.        . Probiotic Product (PROBIOTIC PO) Take 1 capsule by mouth daily. As needed with antibiotics      . tiotropium (SPIRIVA) 18 MCG  inhalation capsule Place 1 capsule (18 mcg total) into inhaler and inhale daily.  90 capsule  1  . venlafaxine (EFFEXOR) 75 MG tablet Take 1 tablet (75 mg total) by mouth daily.  90 tablet  3  . DISCONTD: furosemide (LASIX) 20 MG tablet Take one tablet by mouth twice daily as needed for edema  180 tablet  1  . DISCONTD: pantoprazole (PROTONIX) 40 MG tablet Take 1 capsule by mouth daily  90 tablet  1  . DISCONTD: ALPRAZolam (XANAX) 0.25 MG tablet 1-2 tablets 30 minutes prior to MRI  6 tablet  0

## 2011-04-03 NOTE — Patient Instructions (Signed)
No change in medications. Return in     6 months or sooner if needed

## 2011-04-03 NOTE — Assessment & Plan Note (Addendum)
Ana Nunez. COPD with current CAT Score of 31 This patient would be a very poor candidate for bilateral mastectomy at this time the patient is likely going to follow a conservative course Plan Maintain inhaled medications as prescribed Return 6   months

## 2011-04-04 ENCOUNTER — Ambulatory Visit
Admission: RE | Admit: 2011-04-04 | Discharge: 2011-04-04 | Disposition: A | Discharge status: Home / Self Care | Payer: Medicare Other | Source: Ambulatory Visit | Attending: Family Medicine | Admitting: Family Medicine

## 2011-04-04 DIAGNOSIS — R928 Other abnormal and inconclusive findings on diagnostic imaging of breast: Secondary | ICD-10-CM

## 2011-04-04 MED ORDER — GADOBENATE DIMEGLUMINE 529 MG/ML IV SOLN
10.0000 mL | Freq: Once | INTRAVENOUS | Status: AC | PRN
  Administered 2011-04-04: 10 mL via INTRAVENOUS

## 2011-04-05 ENCOUNTER — Ambulatory Visit: Payer: Medicare Other

## 2011-04-05 ENCOUNTER — Telehealth: Payer: Self-pay | Admitting: *Deleted

## 2011-04-05 ENCOUNTER — Ambulatory Visit (HOSPITAL_BASED_OUTPATIENT_CLINIC_OR_DEPARTMENT_OTHER): Payer: Medicare Other | Admitting: Oncology

## 2011-04-05 ENCOUNTER — Other Ambulatory Visit: Payer: Medicare Other | Admitting: Lab

## 2011-04-05 VITALS — BP 140/80 | HR 98 | Temp 98.4°F | Ht 64.0 in | Wt 239.6 lb

## 2011-04-05 DIAGNOSIS — C50919 Malignant neoplasm of unspecified site of unspecified female breast: Secondary | ICD-10-CM

## 2011-04-05 DIAGNOSIS — Z170 Estrogen receptor positive status [ER+]: Secondary | ICD-10-CM

## 2011-04-05 LAB — CBC WITH DIFFERENTIAL/PLATELET
Basophils Absolute: 0 10*3/uL (ref 0.0–0.1)
Eosinophils Absolute: 0.6 10*3/uL — ABNORMAL HIGH (ref 0.0–0.5)
LYMPH%: 26 % (ref 14.0–49.7)
MCV: 101.5 fL — ABNORMAL HIGH (ref 79.5–101.0)
MONO%: 6.7 % (ref 0.0–14.0)
NEUT#: 4.1 10*3/uL (ref 1.5–6.5)
Platelets: 183 10*3/uL (ref 145–400)
RBC: 4.02 10*6/uL (ref 3.70–5.45)

## 2011-04-05 LAB — COMPREHENSIVE METABOLIC PANEL
Alkaline Phosphatase: 108 U/L (ref 39–117)
BUN: 31 mg/dL — ABNORMAL HIGH (ref 6–23)
Glucose, Bld: 198 mg/dL — ABNORMAL HIGH (ref 70–99)
Sodium: 136 mEq/L (ref 135–145)
Total Bilirubin: 0.3 mg/dL (ref 0.3–1.2)

## 2011-04-05 NOTE — Telephone Encounter (Signed)
main number daughter please do not call the patient call only [phone]

## 2011-04-05 NOTE — Progress Notes (Signed)
ID: Rubie Maid   DOB: Aug 18, 1929  MR#: [id number]  CSN#:[id number]  INTERVAL HISTORY: Reylynn returns today with her husband, Kennyth Arnold, and their daughter [name] for followup of Verbie's breast cancer. We had released her from our care about 2 years ago. Since then however she had mammography showing a possible of recurrence in the right breast and MRIs suggesting abnormalities in both breasts. Biopsy of the right breast mass on 03/17/2011 showed ([id number]) and invasive ductal breast cancer which is 100% estrogen and 100% progesterone receptor positive, with an elevated proliferation marker at 45%, and importantly with amplification of HER-2, the ratio being 3.10. Biopsy of the left breast has just been read as benign.  REVIEW OF SYSTEMS: Lakendria has multiple medical problems as noted below. Her functional status is very limited. Perhaps the most worrisome issue is her headaches, which occur almost daily, occasionally quite severe. She sleeps poorly, describes herself as tired, and spends most of the day in a recliner. She does do her own dressing and her own washing, she occasionally helps a bit with cooking. She has problems with her gums, problems with her sinuses, a history of palpitations and occasional chest tightness, chronic COPD as noted in Dr. Lynelle Doctor note, urinary leakage, arthritis particularly involving the knees, some anxiety and depression although this is not particularly apparent today, and some forgetfulness, which is episodic. A detailed review of systems was otherwise stable  PAST MEDICAL HISTORY: Past Medical History  Diagnosis Date  . [name]   . Fatty liver   . GERD (gastroesophageal reflux disease)   . Type II or unspecified type diabetes mellitus without mention of complication, not stated as uncontrolled   . Positive PPD   . Abnormal involuntary movements   . Hypercholesteremia   . Hypothyroidism   . Unspecified urinary incontinence   . Vocal cord dysfunction   . Multiple  sclerosis   . Hypertension   . Depression   . CAD (coronary artery disease)   . COPD (chronic obstructive pulmonary disease)   . Asthma   . Community acquired pneumonia   . Arthritis   . Adenocarcinoma, breast     Right breast  . Emphysema of lung   . Neuromuscular disorder     MS  . TIA (transient ischemic attack)   . Breast cancer     PAST SURGICAL HISTORY: Past Surgical History  Procedure Date  . Appendectomy 1942  . Tonsillectomy 1951  . Dilation and curettage of uterus 1964  . Nasal sinus surgery 1999  . Breast lumpectomy 1/06    Right  . Fracture surgery may 2010    R ankle     FAMILY HISTORY Family History  Problem Relation Age of Onset  . Coronary artery disease Father   . Heart disease Father   . Pancreatic cancer Mother   . Emphysema Mother   . Diabetes Mother   . COPD Mother   . Pancreatic cancer Sister   . Cancer Sister     pancreatic  . Lymphoma Sister   . Cancer Sister     pancreatic  . Diabetes Sister   . Thyroid cancer Daughter   . Hypertension Sister   . Hypertension Brother   . Cancer Brother     CML  . Liver cancer Sister   . Cirrhosis Sister   . Cancer Maternal Aunt     breast  . Cancer Brother     bladder  The patient's father died at the age of 49 from cardiac  disease. The patient's mother died at the age of 39 from complications of diabetes and emphysema.  The patient has two brothers surviving.  She had three sisters; one died from pancreatic cancer, one with complications from transverse myelitis, the other from COPD.  GYNECOLOGIC HISTORY: She is G6, P5, parity age 59.  The patient's last menstrual period was in the 1980s. She never took hormone replacement.    SOCIAL HISTORY: She is a retired Engineer, civil (consulting).  Her husband,  also retired is here with her today as is her daughter, [name] who used to be Archivist.  The other children are [name], who lives in [place] and is a Medical illustrator.  [name], who lives in [place] and works for a  theme park.  [name], who lives in [place] and is an Scientist, water quality; and [name], who lives in [place] and works at [employer]. The patient has 13 grandchildren and one great-grandchild.  She is a member of [employer]. 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   ADVANCED DIRECTIVES:  HEALTH MAINTENANCE: History  Substance Use Topics  . Smoking status: Former Smoker -- 1.5 packs/day for 25 years    Types: Cigarettes    Quit date: 02/07/1984  . Smokeless tobacco: Never Used  . Alcohol Use: Yes     1-2 glaases of wine daily     Colonoscopy:  PAP:  Bone density:  Lipid panel:  Allergies  Allergen Reactions  . Avelox (Moxifloxacin Hcl In Nacl) Rash    pruritis    Current Outpatient Prescriptions  Medication Sig Dispense Refill  . albuterol (PROVENTIL HFA) 108 (90 BASE) MCG/ACT inhaler Inhale 2 puffs into the lungs every 6 (six) hours as needed.  3 Inhaler  1  . albuterol (PROVENTIL) (2.5 MG/3ML) 0.083% nebulizer solution Take 2.5 mg by nebulization every 4 (four) hours as needed.        Marland Kitchen aspirin 81 MG tablet Take 81 mg by mouth daily.        . benzonatate (TESSALON) 200 MG capsule Take 1 capsule (200 mg total) by mouth 3 (three) times daily as needed.  100 capsule  1  . Calcium Carbonate-Vitamin D (RA CALCIUM PLUS VITAMIN D) 600-400 MG-UNIT per tablet Take 1 tablet by mouth 2 (two) times daily.        . chlorpheniramine-HYDROcodone (TUSSIONEX PENNKINETIC ER) 10-8 MG/5ML LQCR Take 5 mLs by mouth every 12 (twelve) hours as needed.        . cloNIDine (CATAPRES) 0.3 MG tablet Take 1 tablet (0.3 mg total) by mouth 2 (two) times daily.  180 tablet  3  . cyclobenzaprine (FLEXERIL) 10 MG tablet 2 (two) times daily. Twice daily and as needed      . dextromethorphan-guaiFENesin (MUCINEX DM) 30-600 MG per 12 hr tablet Take 1 tablet by mouth 2 (two) times daily as needed.       . famotidine (PEPCID) 20 MG tablet Take 1 tablet (20 mg total) by mouth at bedtime.  90 tablet  3  . fesoterodine  (TOVIAZ) 4 MG TB24 Take 4 mg by mouth daily.        . furosemide (LASIX) 20 MG tablet as needed. Take one tablet by mouth twice daily as needed for edema      . HYDROcodone-acetaminophen (VICODIN) 5-500 MG per tablet Take one tablet by mouth twice daily as needed for arthritis  90 tablet  0  . levothyroxine (SYNTHROID, LEVOTHROID) 137 MCG tablet Take 1 tablet (137 mcg total) by mouth daily.  90 tablet  3  . losartan (COZAAR) 50 MG tablet Take one tablet by mouth daily  90 tablet  3  . magnesium oxide (MAG-OX) 400 MG tablet Take 400 mg by mouth daily.        . metFORMIN (GLUMETZA) 500 MG (MOD) 24 hr tablet Take one tablet by mouth twice daily  180 tablet  3  . Multiple Vitamin (MULTIVITAMIN) capsule Take 1 capsule by mouth daily.        . Naproxen Sodium (ALEVE) 220 MG CAPS Take 1 capsule by mouth at bedtime as needed.        . pantoprazole (PROTONIX) 40 MG tablet 2 (two) times daily. Take 1 capsule by mouth daily      . polyethylene glycol (MIRALAX / GLYCOLAX) packet Take 17 g by mouth as needed.        . Probiotic Product (PROBIOTIC PO) Take 1 capsule by mouth daily. As needed with antibiotics      . tiotropium (SPIRIVA) 18 MCG inhalation capsule Place 1 capsule (18 mcg total) into inhaler and inhale daily.  90 capsule  1  . venlafaxine (EFFEXOR) 75 MG tablet Take 1 tablet (75 mg total) by mouth daily.  90 tablet  3    OBJECTIVE: elderly white woman who appears chronically ill Filed Vitals:   04/05/11 1035  BP: 150/109  Pulse: 98  Temp: 98.4 F (36.9 C)     Body mass index is 41.13 kg/(m^2).    ECOG FS: 2  Sclerae unicteric Oropharynx clear No peripheral adenopathy and particularly no evidence of adenopathy in the right breast. Lungs no rales or rhonchi Heart regular rate and rhythm, no murmur appreciated Abd obese,benign MSK no focal spinal tenderness, no peripheral edema Neuro: nonfocal Breasts: Right breast status post remote lumpectomy. I do not palpate a well-demarcated mass in  the right breast; the left breast is status post recent biopsy, with some ecchymosis, but no suspicious finding  LAB RESULTS: Lab Results  Component Value Date   WBC 9.1 06/24/2009   NEUTROABS 4.9 06/21/2009   HGB 11.8* 06/24/2009   HCT 34.6* 06/24/2009   MCV 101.7* 06/24/2009   PLT 241 06/24/2009      Chemistry      Component Value Date/Time   NA 138 06/29/2010 0942   K 4.8 06/29/2010 0942   CL 101 06/29/2010 0942   CO2 29 06/29/2010 0942   BUN 27* 06/29/2010 0942   CREATININE 1.2 06/29/2010 0942      Component Value Date/Time   CALCIUM 9.0 06/29/2010 0942   ALKPHOS 99 06/21/2009 1532   AST 32 06/21/2009 1532   ALT 28 06/21/2009 1532   BILITOT 0.7 06/21/2009 1532       Lab Results  Component Value Date   LABCA2 31 08/27/2008    No results found for this basename: INR:1;PROTIME:1 in the last 168 hours  No results found for this basename: UACOL:1,UAPR:1,USPG:1,UPH:1,UTP:1,UGL:1,UKET:1,UBIL:1,UHGB:1,UNIT:1,UROB:1,ULEU:1,UEPI:1,UWBC:1,URBC:1,UBAC:1,CAST:1,CRYS:1,UCOM:1,BILUA:1 in the last 72 hours   STUDIES: US Breast Right  03/13/2011  *RADIOLOGY REPORT*  Clinical Data:  History of right lumpectomy for breast cancer in 2006.  DIGITAL DIAGNOSTIC RIGHT MAMMOGRAM WITH CAD AND RIGHT BREAST ULTRASOUND  Comparison:  Prior exams  Findings:  Right lumpectomy changes are noted in the upper outer quadrant.  The breast parenchymal pattern is predominately fatty. There is a new irregular 1 cm mass anterior to the right upper outer quadrant lumpectomy site.  No new suspicious calcification, mass, or architectural distortion is seen in the left breast.  On physical exam,  there are lumpectomy changes in the right upper outer quadrant but no focal palpable finding.  Ultrasound of the right upper outer quadrant demonstrates an irregular hypoechoic mass in the right breast nine o'clock location 5 cm from the nipple, 1.3 cm medial to the lumpectomy site, measuring 5 x 5 x 4 mm.  This corresponds to the  mammographic abnormality.  IMPRESSION: New suspicious mass medial to the right breast upper outer quadrant lumpectomy site, suspicious for recurrence.  No evidence for malignancy in the left breast.  Ultrasound-guided core biopsy has been scheduled for 03/17/2011 at the patient's convenience. Findings and recommendations discussed with the patient and provided in written form at the time of the exam.  BI-RADS CATEGORY 4:  Suspicious abnormality - biopsy should be considered.  Recommendation:  Ultrasound guided right breast biopsy  Original Report Authenticated By: Harrel Lemon, M.D.   Mr Breast Bilateral W Wo Contrast  03/24/2011  *RADIOLOGY REPORT*  Clinical Data: Recently diagnosed invasive ductal carcinoma anterior to the lumpectomy site in the right breast.  Status post right lumpectomy for breast cancer in 2005.  BUN and creatinine were obtained on site at [facility] at [address] Results:  BUN 32 mg/dL,  Creatinine 1.2 mg/dL.  BILATERAL BREAST MRI WITH AND WITHOUT CONTRAST  Technique: Multiplanar, multisequence MR images of both breasts were obtained prior to and following the intravenous administration of 10ml of MultiHance. A half-dose of contrast was used due to a calculated glomerular filtration rate of 43.  Three dimensional images were evaluated at the independent DynaCad workstation.  Comparison:  Recent mammogram, ultrasound biopsy examinations and previous breast MR dated 03/07/2006.  Findings: Minimal background parenchymal enhancement in both breasts.  Post lumpectomy changes deep in the upper outer right breast.  Anterior to the lumpectomy site on the right, an 8 x 8 x 8 mm rounded, mildly irregularly marginated enhancing mass is demonstrated containing a biopsy marker clip artifact.  This mass has predominately persistent and plateau enhancement kinetics with a minimal amount of rapid washin/washout.  In the subareolar region of the left breast, clumped, linear, non mass  enhancement is demonstrated extending to the nipple.  This was not present on the previous MR dated 03/07/2006.  This has persistent enhancement kinetics and measures 5.2 x 1.5 x 1.1 cm in maximum dimensions.  No additional masses or areas of enhancement suspicious for malignancy in either breast.  No abnormal appearing lymph nodes.  IMPRESSION:  1.  8 x 8 x 8 mm biopsy-proven invasive ductal carcinoma in the upper outer right breast. 2.  5.2 x 1.5 x 1.1 cm interval area of clumped, linear, low grade enhancement in the subareolar region of the left breast.  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  Consideration of MR guided core needle biopsy of this area is recommended. 3.  No evidence of malignancy elsewhere in either breast and no adenopathy.  THREE-DIMENSIONAL MR IMAGE RENDERING ON INDEPENDENT WORKSTATION:  Three-dimensional MR images were rendered by post-processing of the original MR data on an independent workstation.  The three- dimensional MR images were interpreted, and findings were reported in the accompanying complete MRI report for this study.  BI-RADS CATEGORY 4:  Suspicious abnormality - biopsy should be considered.  Recommendation:  Consideration of left breast MR guided core needle biopsy.  Original Report Authenticated By: Darrol Angel, M.D.    ASSESSMENT: 78 year old [place] woman,  (1) status post right lumpectomy and sentinel lymph node biopsy January  of 2006 for an ER and PR positive, HER-2 negative invasive ductal carcinoma measuring 1.4 cm, grade 2, with a micrometastatic deposit in 1 out of 3 lymph nodes, status post radiation, on Arimidexbetween April of 2006 and April 2011  (2) now with a triple positive right-sided breast cancer, clinically T1b N0, with an Mib-1 of 45%  PLAN: We discussed her situation at length today. They understand she is a poor operative candidate, although the tumor itself would be easily resectable. Possibly this could be done, if  necessary, under local anesthesia.  However I think it more reasonable approach, underwent favored by both her pulmonary and surgical physicians, is simply to treat this small mass with the goal of control. We have 3 options they're: We can do chemotherapy with trastuzumab and then transitioned to anti-estrogens; we can simply do anti-estrogens with trastuzumab; or we can simply do anti-estrogens.  Given her overall functional status I would stay away from chemotherapy if at all possible. I would favor treating her with letrozole plus trastuzumab or even conceivably letrozole alone and adding the trastuzumab later if needed.  We're going to start however with a brain MRI. I am concerned about her headaches. If she has brain metastases as she would need radiation. Assuming that is negative, she would have an echocardiogram the following week. Finally the family would like to know if there is evidence of metastatic disease and I have scheduled her for a CT scan of the chest before she returns to see me March 22. At that point we should be able to make a definitive decision regarding her treatment options.  I have encouraged her daughter [name] to call us for results as they come in. They understand that I will be out of town for the next 2 weeks. Note that the patient has a living will and DO NOT RESUSCITATE in place and in case of a terminal event she would not want to be resuscitated.  Artyom Stencel C    04/05/2011

## 2011-04-05 NOTE — Telephone Encounter (Signed)
Gave patient's daughter appointment for 04-28-2011 at 10:30am

## 2011-04-07 ENCOUNTER — Ambulatory Visit (HOSPITAL_COMMUNITY)
Admission: RE | Admit: 2011-04-07 | Discharge: 2011-04-07 | Disposition: A | Discharge status: Home / Self Care | Payer: Medicare Other | Source: Ambulatory Visit | Attending: Oncology | Admitting: Oncology

## 2011-04-07 DIAGNOSIS — C50919 Malignant neoplasm of unspecified site of unspecified female breast: Secondary | ICD-10-CM | POA: Insufficient documentation

## 2011-04-07 DIAGNOSIS — G35 Multiple sclerosis: Secondary | ICD-10-CM | POA: Insufficient documentation

## 2011-04-07 DIAGNOSIS — R413 Other amnesia: Secondary | ICD-10-CM | POA: Insufficient documentation

## 2011-04-07 DIAGNOSIS — F29 Unspecified psychosis not due to a substance or known physiological condition: Secondary | ICD-10-CM | POA: Insufficient documentation

## 2011-04-07 DIAGNOSIS — R51 Headache: Secondary | ICD-10-CM | POA: Insufficient documentation

## 2011-04-07 DIAGNOSIS — G9389 Other specified disorders of brain: Secondary | ICD-10-CM | POA: Insufficient documentation

## 2011-04-07 MED ORDER — GADOBENATE DIMEGLUMINE 529 MG/ML IV SOLN
10.0000 mL | Freq: Once | INTRAVENOUS | Status: AC | PRN
  Administered 2011-04-07: 10 mL via INTRAVENOUS

## 2011-04-07 MED ORDER — IOHEXOL 300 MG/ML  SOLN
100.0000 mL | Freq: Once | INTRAMUSCULAR | Status: AC | PRN
  Administered 2011-04-07: 80 mL via INTRAVENOUS

## 2011-04-11 ENCOUNTER — Telehealth: Payer: Self-pay | Admitting: *Deleted

## 2011-04-11 NOTE — Telephone Encounter (Signed)
Daughter Ana Nunez called requesting test results from collaborative nurse.  Patient had a chest CT and MRI of brain on 04-07-11.  Ana Nunez can be reached at [phone].

## 2011-04-14 ENCOUNTER — Encounter: Payer: Self-pay | Admitting: *Deleted

## 2011-04-14 NOTE — Progress Notes (Unsigned)
Per AB, notified pt daughter [name], that MD will review results upon his return and that brain mri may be inconclusive

## 2011-04-18 ENCOUNTER — Ambulatory Visit (HOSPITAL_COMMUNITY)
Admission: RE | Admit: 2011-04-18 | Discharge: 2011-04-18 | Disposition: A | Discharge status: Home / Self Care | Payer: Medicare Other | Source: Ambulatory Visit | Attending: Oncology | Admitting: Oncology

## 2011-04-18 DIAGNOSIS — C50919 Malignant neoplasm of unspecified site of unspecified female breast: Secondary | ICD-10-CM | POA: Insufficient documentation

## 2011-04-18 DIAGNOSIS — J449 Chronic obstructive pulmonary disease, unspecified: Secondary | ICD-10-CM | POA: Insufficient documentation

## 2011-04-18 DIAGNOSIS — I369 Nonrheumatic tricuspid valve disorder, unspecified: Secondary | ICD-10-CM

## 2011-04-18 DIAGNOSIS — K219 Gastro-esophageal reflux disease without esophagitis: Secondary | ICD-10-CM | POA: Insufficient documentation

## 2011-04-18 DIAGNOSIS — I10 Essential (primary) hypertension: Secondary | ICD-10-CM | POA: Insufficient documentation

## 2011-04-18 DIAGNOSIS — E119 Type 2 diabetes mellitus without complications: Secondary | ICD-10-CM | POA: Insufficient documentation

## 2011-04-18 DIAGNOSIS — J4489 Other specified chronic obstructive pulmonary disease: Secondary | ICD-10-CM | POA: Insufficient documentation

## 2011-04-18 NOTE — Progress Notes (Signed)
  Echocardiogram 2D Echocardiogram has been performed.  Mercy Moore 04/18/2011, 12:19 PM

## 2011-04-28 ENCOUNTER — Ambulatory Visit (HOSPITAL_BASED_OUTPATIENT_CLINIC_OR_DEPARTMENT_OTHER): Payer: Medicare Other | Admitting: Oncology

## 2011-04-28 ENCOUNTER — Telehealth: Payer: Self-pay | Admitting: *Deleted

## 2011-04-28 VITALS — BP 150/83 | HR 96 | Temp 97.4°F | Ht 64.0 in | Wt 236.7 lb

## 2011-04-28 DIAGNOSIS — C50919 Malignant neoplasm of unspecified site of unspecified female breast: Secondary | ICD-10-CM

## 2011-04-28 DIAGNOSIS — Z170 Estrogen receptor positive status [ER+]: Secondary | ICD-10-CM

## 2011-04-28 NOTE — Telephone Encounter (Signed)
gave patient appointment for 07-2011 call kathy mcconnell left voice message to let her know that ordrers are in epic for mri of the breast

## 2011-04-28 NOTE — Telephone Encounter (Signed)
x

## 2011-04-28 NOTE — Progress Notes (Signed)
 ID: Ana Nunez   DOB: 08/17/1929  MR#: [id number]  CSN#:[id number]  HISTORY OF PRESENT ILLNESS Ana Nunez has a prior history of breast cancer as detailed below, and we had released her from our care April 2011. More recently however she had mammography showing a possible of recurrence in the right breast and MRIs suggesting abnormalities in both breasts. Biopsy of the right breast mass on 03/17/2011 showed ([id number]) an invasive ductal breast cancer which is 100% estrogen and 100% progesterone receptor positive, with an elevated proliferation marker at 45%, and importantly with amplification of HER-2, the ratio being 3.10. Biopsy of the left breast was benign.  INTERVAL HISTORY: Since the last visit here the patient had a chest CT and brain MRI. Neither shows evidence of metastatic disease. She had an echocardiogram which shows a well preserved ejection fraction. She comes today with her daughter and husband as to make a definitive decision regarding treatment  REVIEW OF SYSTEMS: This is very stable and she certainly has multiple medical problems as listed below. Nevertheless yesterday for example she woke up around it in the morning, at breakfast, sat watching TV in the morning (and fell asleep on the recliner), woke up around 11:30, did to wash, went to visit a friend who recently moved to an assisted living situation, at supper, and went back to bed about midnight. Currently she denies unusual headaches, visual changes, nausea, vomiting, cough, phlegm production, pleurisy, shortness of breath, chest pain or pressure, or any change in bladder or bowel habits  PAST MEDICAL HISTORY: Past Medical History  Diagnosis Date  . [name]   . Fatty liver   . GERD (gastroesophageal reflux disease)   . Type II or unspecified type diabetes mellitus without mention of complication, not stated as uncontrolled   . Positive PPD   . Abnormal involuntary movements   . Hypercholesteremia   . Hypothyroidism   .  Unspecified urinary incontinence   . Vocal cord dysfunction   . Multiple sclerosis   . Hypertension   . Depression   . CAD (coronary artery disease)   . COPD (chronic obstructive pulmonary disease)   . Asthma   . Community acquired pneumonia   . Arthritis   . Adenocarcinoma, breast     Right breast  . Emphysema of lung   . Neuromuscular disorder     MS  . TIA (transient ischemic attack)   . Breast cancer     PAST SURGICAL HISTORY: Past Surgical History  Procedure Date  . Appendectomy 1942  . Tonsillectomy 1951  . Dilation and curettage of uterus 1964  . Nasal sinus surgery 1999  . Breast lumpectomy 1/06    Right  . Fracture surgery may 2010    R ankle     FAMILY HISTORY Family History  Problem Relation Age of Onset  . Coronary artery disease Father   . Heart disease Father   . Pancreatic cancer Mother   . Emphysema Mother   . Diabetes Mother   . COPD Mother   . Pancreatic cancer Sister   . Cancer Sister     pancreatic  . Lymphoma Sister   . Cancer Sister     pancreatic  . Diabetes Sister   . Thyroid cancer Daughter   . Hypertension Sister   . Hypertension Brother   . Cancer Brother     CML  . Liver cancer Sister   . Cirrhosis Sister   . Cancer Maternal Aunt     breast  . Cancer  Brother     bladder  The patient's father died at the age of 24 from cardiac disease. The patient's mother died at the age of 60 from complications of diabetes and emphysema.  The patient has two brothers surviving.  She had three sisters; one died from pancreatic cancer, one with complications from transverse myelitis, the other from COPD.  GYNECOLOGIC HISTORY: She is G6, P5, parity age 49.  The patient's last menstrual period was in the 1980s. She never took hormone replacement.    SOCIAL HISTORY: She is a retired Engineer, civil (consulting).  Her husband,  also retired is here with her today as is her daughter, [name] who used to be Archivist.  The other children are [name], who lives in  [place] and is a Medical illustrator.  [name], who lives in [place] and works for a theme park.  [name], who lives in [place] and is an Scientist, water quality; and [name], who lives in [place] and works at [employer]. The patient has 13 grandchildren and one great-grandchild.  She is a member of [employer]. 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   ADVANCED DIRECTIVES:  HEALTH MAINTENANCE: History  Substance Use Topics  . Smoking status: Former Smoker -- 1.5 packs/day for 25 years    Types: Cigarettes    Quit date: 02/07/1984  . Smokeless tobacco: Never Used  . Alcohol Use: Yes     1-2 glaases of wine daily     Colonoscopy:  PAP:  Bone density:  Lipid panel:  Allergies  Allergen Reactions  . Avelox (Moxifloxacin Hcl In Nacl) Rash    pruritis    Current Outpatient Prescriptions  Medication Sig Dispense Refill  . albuterol (PROVENTIL HFA) 108 (90 BASE) MCG/ACT inhaler Inhale 2 puffs into the lungs every 6 (six) hours as needed.  3 Inhaler  1  . albuterol (PROVENTIL) (2.5 MG/3ML) 0.083% nebulizer solution Take 2.5 mg by nebulization every 4 (four) hours as needed.        Marland Kitchen aspirin 81 MG tablet Take 81 mg by mouth daily.        . benzonatate (TESSALON) 200 MG capsule Take 1 capsule (200 mg total) by mouth 3 (three) times daily as needed.  100 capsule  1  . Calcium Carbonate-Vitamin D (RA CALCIUM PLUS VITAMIN D) 600-400 MG-UNIT per tablet Take 1 tablet by mouth 2 (two) times daily.        . chlorpheniramine-HYDROcodone (TUSSIONEX PENNKINETIC ER) 10-8 MG/5ML LQCR Take 5 mLs by mouth every 12 (twelve) hours as needed.        . cloNIDine (CATAPRES) 0.3 MG tablet Take 1 tablet (0.3 mg total) by mouth 2 (two) times daily.  180 tablet  3  . cyclobenzaprine (FLEXERIL) 10 MG tablet 2 (two) times daily. Twice daily and as needed      . dextromethorphan-guaiFENesin (MUCINEX DM) 30-600 MG per 12 hr tablet Take 1 tablet by mouth 2 (two) times daily as needed.       . famotidine (PEPCID) 20 MG tablet Take  1 tablet (20 mg total) by mouth at bedtime.  90 tablet  3  . fesoterodine (TOVIAZ) 4 MG TB24 Take 4 mg by mouth daily.        . furosemide (LASIX) 20 MG tablet as needed. Take one tablet by mouth twice daily as needed for edema      . HYDROcodone-acetaminophen (VICODIN) 5-500 MG per tablet Take one tablet by mouth twice daily as needed for arthritis  90 tablet  0  . levothyroxine (  SYNTHROID, LEVOTHROID) 137 MCG tablet Take 1 tablet (137 mcg total) by mouth daily.  90 tablet  3  . losartan (COZAAR) 50 MG tablet Take one tablet by mouth daily  90 tablet  3  . magnesium oxide (MAG-OX) 400 MG tablet Take 400 mg by mouth daily.        . metFORMIN (GLUMETZA) 500 MG (MOD) 24 hr tablet Take one tablet by mouth twice daily  180 tablet  3  . Multiple Vitamin (MULTIVITAMIN) capsule Take 1 capsule by mouth daily.        . Naproxen Sodium (ALEVE) 220 MG CAPS Take 1 capsule by mouth at bedtime as needed.        . pantoprazole (PROTONIX) 40 MG tablet 2 (two) times daily. Take 1 capsule by mouth daily      . polyethylene glycol (MIRALAX / GLYCOLAX) packet Take 17 g by mouth as needed.        . Probiotic Product (PROBIOTIC PO) Take 1 capsule by mouth daily. As needed with antibiotics      . tiotropium (SPIRIVA) 18 MCG inhalation capsule Place 1 capsule (18 mcg total) into inhaler and inhale daily.  90 capsule  1  . venlafaxine (EFFEXOR) 75 MG tablet Take 1 tablet (75 mg total) by mouth daily.  90 tablet  3    OBJECTIVE: elderly white woman who appears comfortable Filed Vitals:   04/28/11 1029  BP: 150/83  Pulse: 96  Temp: 97.4 F (36.3 C)     Body mass index is 40.63 kg/(m^2).    ECOG FS: 2  Sclerae unicteric Oropharynx clear No peripheral adenopathy and particularly no evidence of adenopathy in the right axilla Lungs no rales or rhonchi Heart regular rate and rhythm, no murmur appreciated Abd obese,benign MSK no focal spinal tenderness, no peripheral edema Neuro: nonfocal Breasts: Right breast  status post remote lumpectomy. I do not palpate a well-demarcated mass in the right breast; the left breast shows no suspicious findings  LAB RESULTS: Lab Results  Component Value Date   WBC 7.1 04/05/2011   NEUTROABS 4.1 04/05/2011   HGB 13.8 04/05/2011   HCT 40.8 04/05/2011   MCV 101.5* 04/05/2011   PLT 183 04/05/2011      Chemistry      Component Value Date/Time   NA 136 04/05/2011 1146   K 4.8 04/05/2011 1146   CL 102 04/05/2011 1146   CO2 25 04/05/2011 1146   BUN 31* 04/05/2011 1146   CREATININE 1.25* 04/05/2011 1146      Component Value Date/Time   CALCIUM 9.2 04/05/2011 1146   ALKPHOS 108 04/05/2011 1146   AST 37 04/05/2011 1146   ALT 32 04/05/2011 1146   BILITOT 0.3 04/05/2011 1146       Lab Results  Component Value Date   LABCA2 28 04/05/2011    No results found for this basename: INR:1;PROTIME:1 in the last 168 hours  No results found for this basename: UACOL:1,UAPR:1,USPG:1,UPH:1,UTP:1,UGL:1,UKET:1,UBIL:1,UHGB:1,UNIT:1,UROB:1,ULEU:1,UEPI:1,UWBC:1,URBC:1,UBAC:1,CAST:1,CRYS:1,UCOM:1,BILUA:1 in the last 72 hours   STUDIES: CT CHEST WITH CONTRAST  Technique: Multidetector CT imaging of the chest was performed  following the standard protocol during bolus administration of  intravenous contrast.  Contrast: 80mL OMNIPAQUE IOHEXOL 300 MG/ML IJ SOLN  Comparison: 04/18/2009  Findings: Peripherally enhancing fluid density structure within the  right breast likely represents a resection cavity. This measures  3.1 cm, image 26.  No enlarged axillary or supraclavicular lymph nodes. There is no  mediastinal or hilar adenopathy. There is no pericardial or  pleural  effusions identified.  The trachea appears patent and is midline.  The lungs are mildly emphysematous. Subpleural scarring within the  mid to lower right upper lobe and right middle lobe noted.  No pulmonary nodule or mass identified.  Review of the visualized osseous structures is significant for mild  spondylosis.    Limited imaging through the upper abdomen shows a hyperattenuating  structure within the fundus of the gallbladder measuring 1.6 cm.  Indeterminate.  IMPRESSION:  1. There is no specific features identified to suggest pulmonary  metastatic disease.  2. Resection cavity is noted within the lateral aspect of the  right breast.  3. Indeterminant enhancing structure within the gallbladder  fundus. Recommend further evaluation of gallbladder sonogram.   MRI HEAD WITHOUT AND WITH CONTRAST  Technique: Multiplanar, multiecho pulse sequences of the brain and  surrounding structures were obtained according to standard protocol  without and with intravenous contrast  Contrast: 10mL MULTIHANCE GADOBENATE DIMEGLUMINE 529 MG/ML IV SOLN  Comparison: MRI 07/15/2010  Findings: Moderate atrophy. Patchy and confluent periventricular  hyperintensity is present throughout the cerebral white matter  bilaterally. This could be seen with chronic microvascular  ischemia or chronic demyelinating disease. Chronic lacunar  infarction right thalamus. Small chronic lacuna left thalamus.  Mild patchy signal in the central pons.  Small focus of restricted diffusion in the periventricular white  matter lateral to the atrium of the right lateral ventricle.  This shows mild poorly defined enhancement. This lesion was not  present on the prior study.  IMPRESSION:  Generalized atrophy. Periventricular white matter hyperintensities  bilaterally may be due to chronic ischemia or demyelinating  disease.  Sub centimeter lesion in the right temporal parietal  periventricular white matter, not present on the MRI of 07/15/2010.  This shows mild ill-defined enhancement and mild restricted  diffusion. Differential diagnosis includes subacute infarct,  active MS plaque, and metastatic disease. I would favor subacute  infarct however follow-up MRI will be helpful to evaluate for any  evolutionary changes. No other lesions  are seen suggesting  metastatic disease however the enhanced images are degraded by  patient motion.  Patient: Ana Nunez, Ana Nunez MR #: [id number] Study Date: 04/18/2011 Gender: F Age: 45 Height: 162.6cm Weight: 104.3kg BSA: 2.47m^2 Pt. Status: Room:  PERFORMING [facility] , [place], [place] SONOGRAPHER [place], RDCS, CCT cc:  ------------------------------------------------------------ LV EF: 55% - 65%  ------------------------------------------------------------ Indications: 174.9.  ------------------------------------------------------------ History: PMH: Chronic obstructive pulmonary disease. Risk factors: Breast cancer. Asthma. GERD. Former tobacco use. Hypertension. Diabetes mellitus.  ------------------------------------------------------------ Study Conclusions  - Left ventricle: The cavity size was normal. Systolic function was normal. The estimated ejection fraction was in the range of 55% to 65%. Wall motion was normal; there were no regional wall motion abnormalities. - Impressions: Overall very poor image qualtiy      ASSESSMENT: 79 year old [place] woman,  (1) status post right lumpectomy and sentinel lymph node biopsy January of 2006 for an ER and PR positive, HER-2 negative invasive ductal carcinoma measuring 1.4 cm, grade 2, with a micrometastatic deposit in 1 out of 3 lymph nodes, status post radiation, on anastrozole between April of 2006 and April 2011  (2) now with a triple positive right-sided breast cancer, clinically T1b N0, with an Mib-1 of 45%  PLAN: We went over her situation again in detail today. The overall plan is to try to avoid surgery. The major choice is whether to try an aromatase inhibitor alone, or an aromatase inhibitor together with trastuzumab.  After much discussion we decided we would try the aromatase inhibitor alone, and since she was on anastrozole previously we'll go to a  different class and try exemestane. She has a good understanding of the possible toxicities side effects and complications of treatment. After 3 months on this medication, we will repeat a breast MRI. If there is evidence of response, we will probably postpone further MRIs for 6-12 months and follow clinically. If there is evidence of progression we will switch to the combination of letrozole plus trastuzumab  Note that the patient has a living will and DO NOT RESUSCITATE in place and in case of a terminal event she would not want to be resuscitated.  , C    04/28/2011

## 2011-05-12 IMAGING — CT CT ANGIO CHEST
2 of 6 series · 19 of 36 positions shown · IV contrast (APPLIED)
Comparison: CT thorax 08/13/2008, chest radiograph 04/17/2009

CLINICAL DATA: Short of breath, pneumonia, concern for pulmonary
embolism.  The

CT ANGIOGRAPHY CHEST WITH CONTRAST
TECHNIQUE: Multidetector CT imaging of the chest was performed
using the standard protocol during bolus administration of
intravenous contrast.  Multiplanar CT image reconstructions
including MIPs were obtained to evaluate the vascular anatomy.
Contrast:  74 ml Omnipaque 300

[Series 8: pulm embolism 1.0 b25f thins · axial · 0.63mm/px · z∈[+1184,+1398]mm · 18 of 238 slices shown]
[im 12/238  lung]
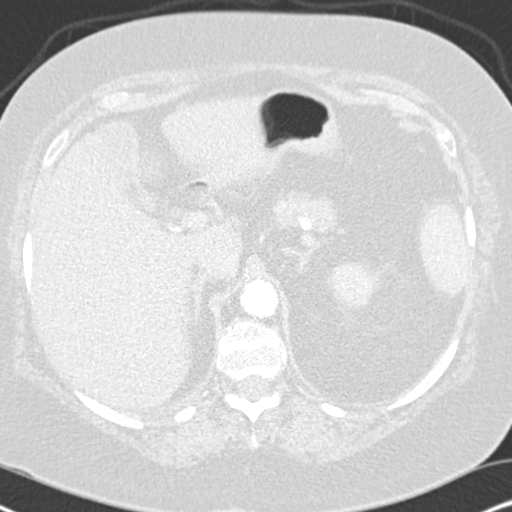
[im 24/238  mediastinal]
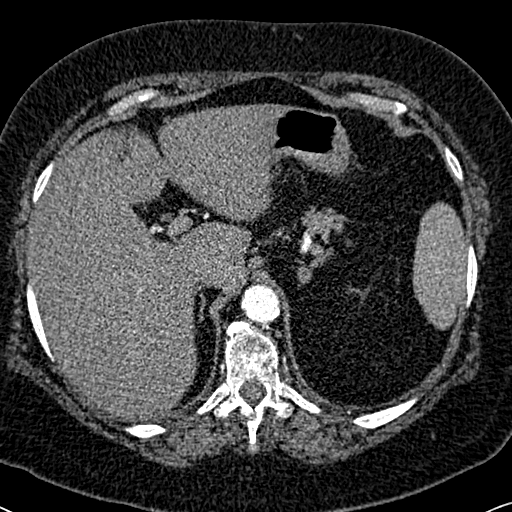
[im 36/238  lung]
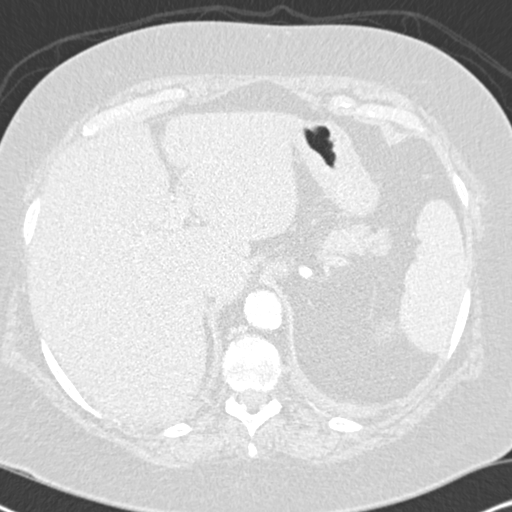
[im 48/238  mediastinal]
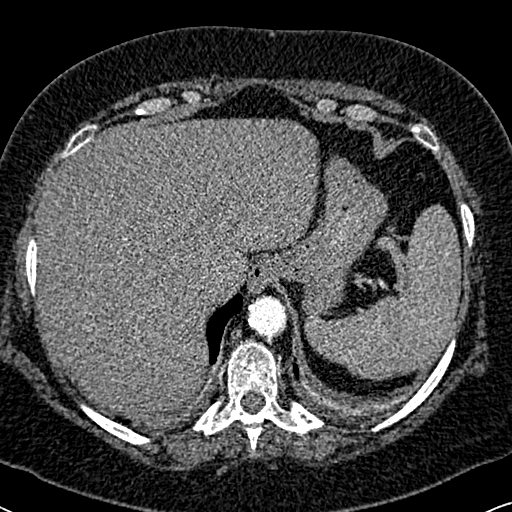
[im 60/238  lung]
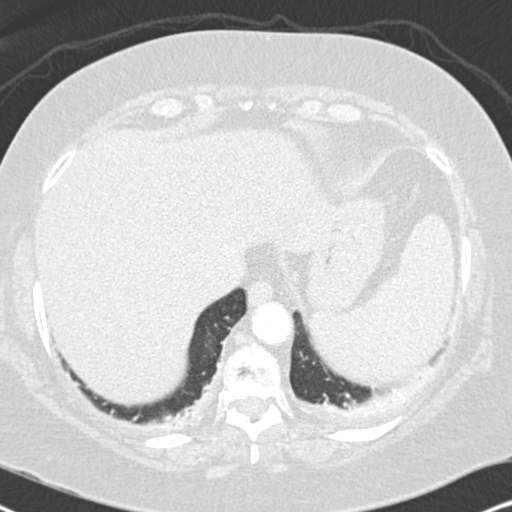
[im 72/238  mediastinal]
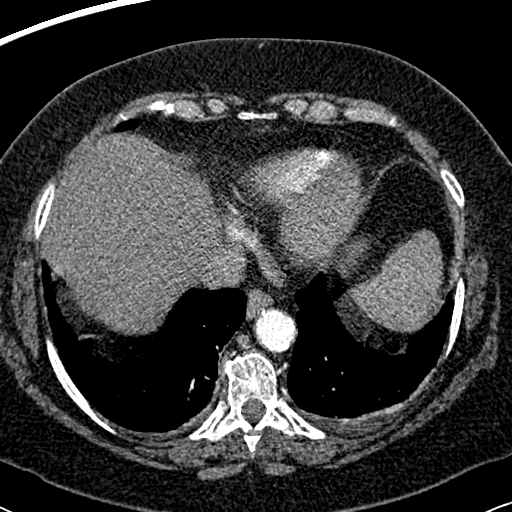
[im 83/238  lung]
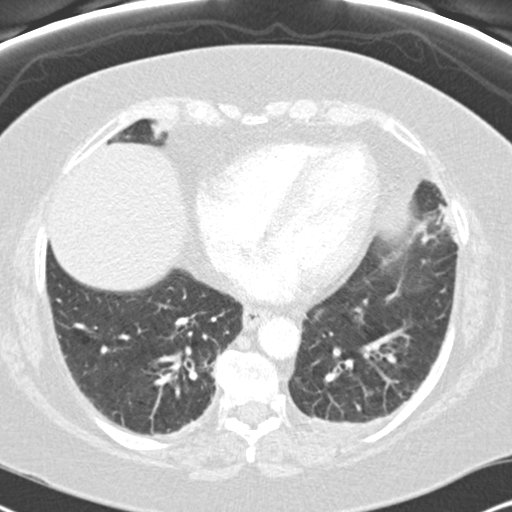
[im 95/238  mediastinal]
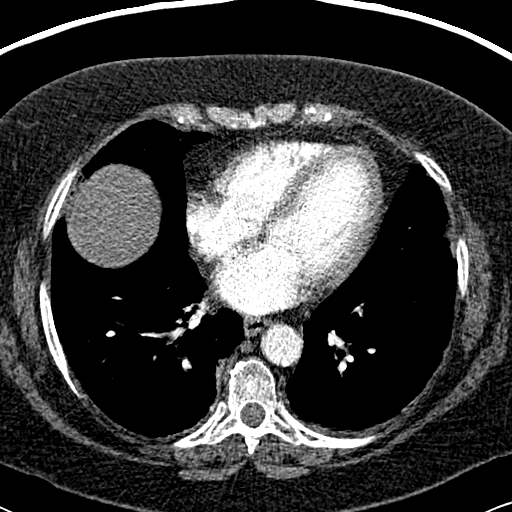
[im 107/238  lung]
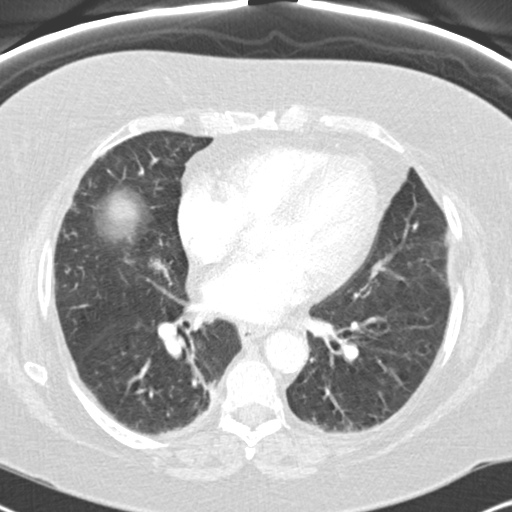
[im 131/238  mediastinal]
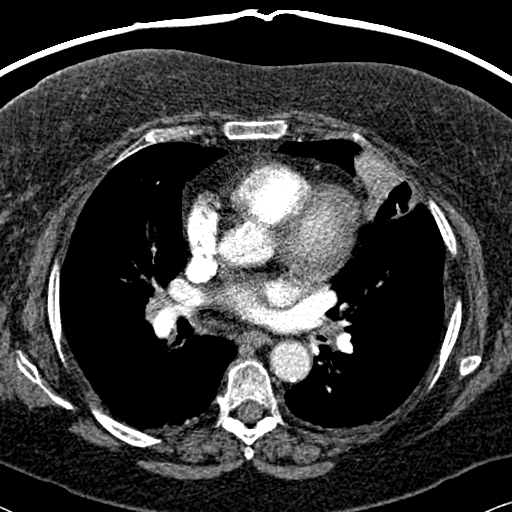
[im 143/238  lung]
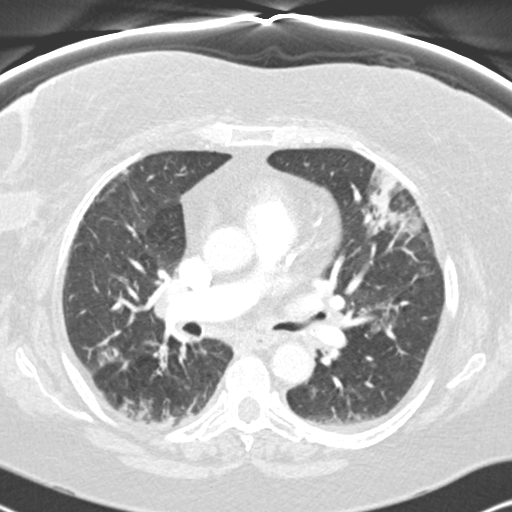
[im 155/238  mediastinal]
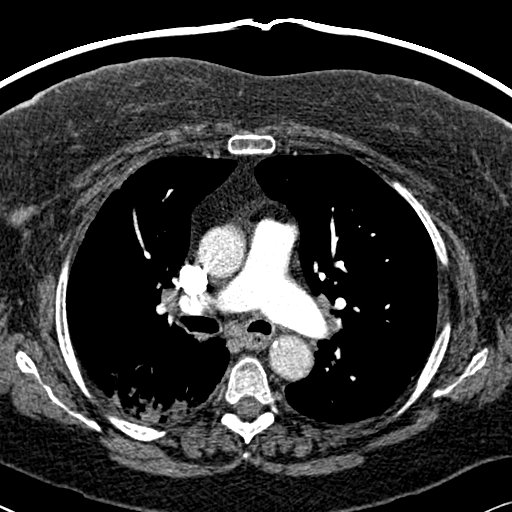
[im 166/238  lung]
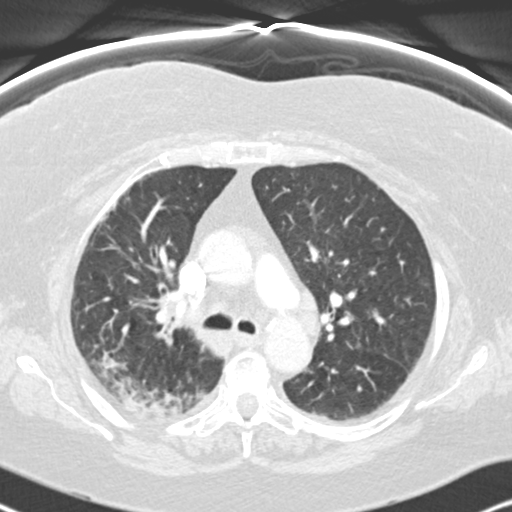
[im 178/238  mediastinal]
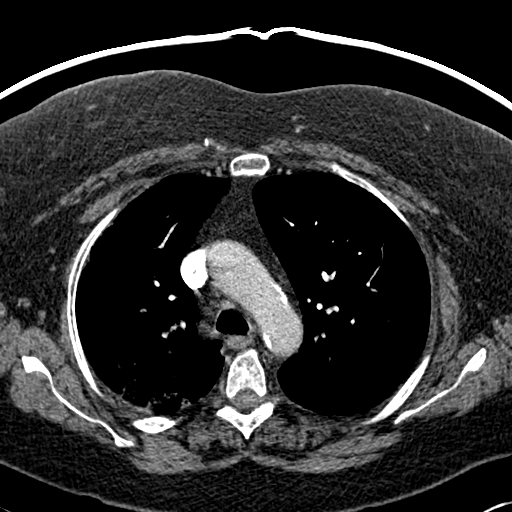
[im 190/238  lung]
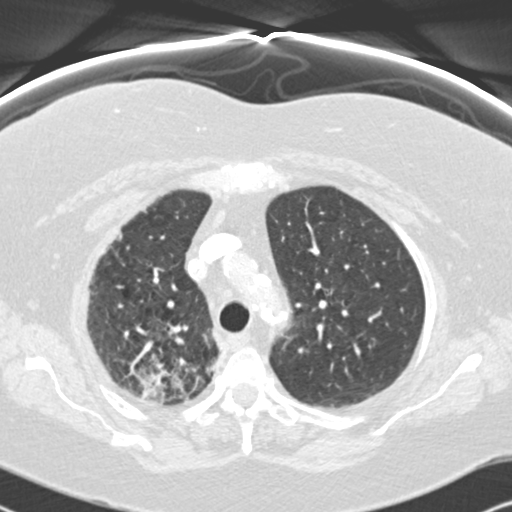
[im 202/238  mediastinal]
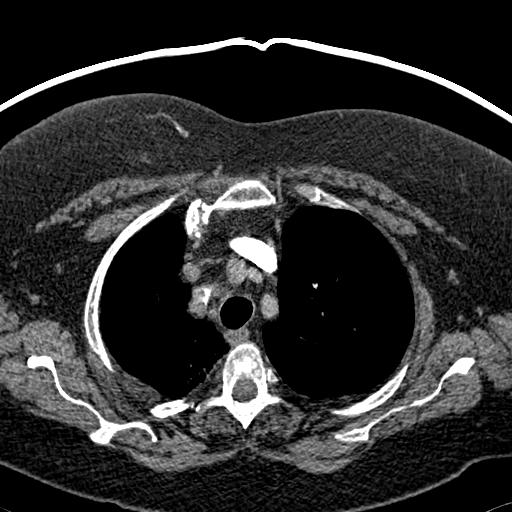
[im 214/238  lung]
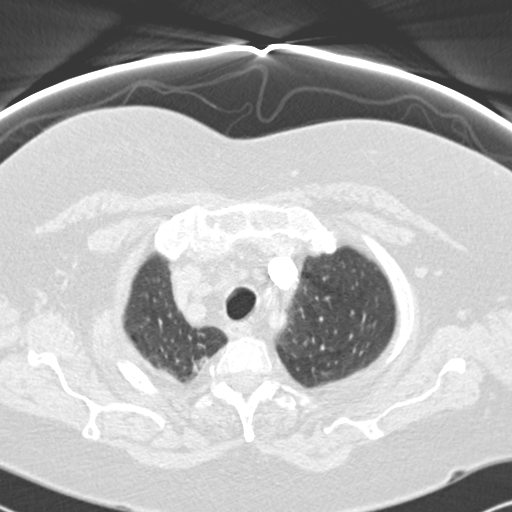
[im 226/238  mediastinal]
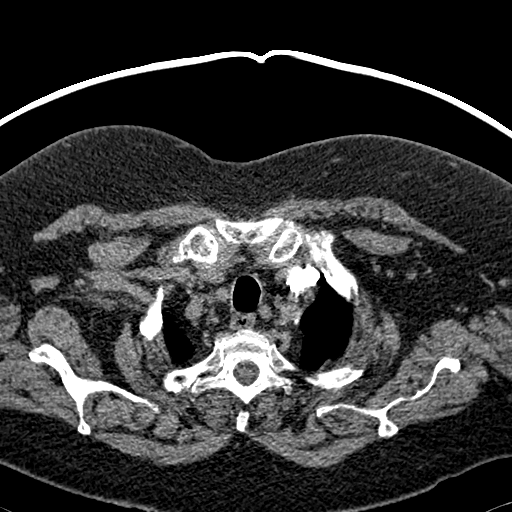

[Series 602: <mpr thick range> · coronal · 0.63mm/px · 1 of 128 slices shown]
[im 64/128  mediastinal]
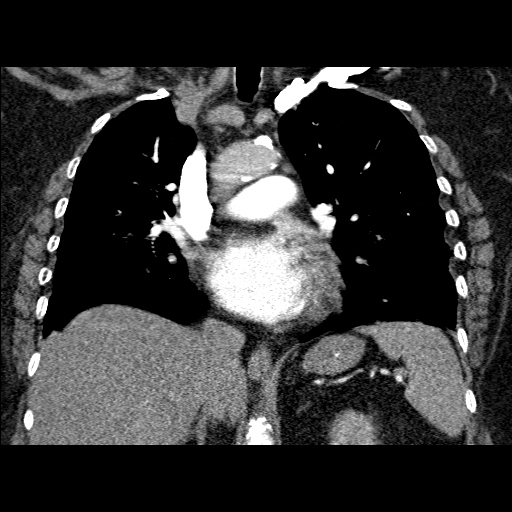

[19 of 36 positions shown; findings below may reference images not displayed]

FINDINGS: No filling defects within the pulmonary arteries to
suggest acute pulmonary embolism.  No acute findings of the aorta
or great vessels.  No pericardial effusion.

No evidence of axillary supraclavicular lymphadenopathy.  No
mediastinal lymphadenopathy.

Small focus of consolidation within the lingular lobe with air
bronchograms.  There is peripheral patchy air space disease in the
superior segment of the right lower lobe.  Trace pleural effusions.
Third focus of air space disease within the posterior right upper
lobe.

Limited view of the upper abdomen is unremarkable.

Limited view of the skeleton is unremarkable.

Review of the MIP images confirms the above findings.
IMPRESSION: 1.  No evidence acute pulmonary embolism.
2.  Multifocal air space disease most consistent with multifocal
pneumonia.

## 2011-05-16 ENCOUNTER — Ambulatory Visit (INDEPENDENT_AMBULATORY_CARE_PROVIDER_SITE_OTHER): Payer: Medicare Other | Admitting: Adult Health

## 2011-05-16 ENCOUNTER — Encounter: Payer: Self-pay | Admitting: Adult Health

## 2011-05-16 VITALS — BP 122/82 | HR 72 | Temp 97.1°F | Ht 65.0 in | Wt 237.2 lb

## 2011-05-16 DIAGNOSIS — J449 Chronic obstructive pulmonary disease, unspecified: Secondary | ICD-10-CM

## 2011-05-16 MED ORDER — AMOXICILLIN-POT CLAVULANATE 875-125 MG PO TABS: 1.0000 | ORAL_TABLET | Freq: Two times a day (BID) | ORAL | Status: AC

## 2011-05-16 MED ORDER — PREDNISONE 10 MG PO TABS: ORAL_TABLET | ORAL | Status: DC

## 2011-05-16 NOTE — Assessment & Plan Note (Signed)
Flare   Plan:  Augmentin 875mg  Twice daily  For 7 days  Mucinex DM Twice daily  As needed  Cough/congestion  Prednisone taper over next week.  Please contact office for sooner follow up if symptoms do not improve or worsen or seek emergency care  follow up 2 months with Dr. Delford Field  and As needed

## 2011-05-16 NOTE — Progress Notes (Signed)
Subjective:    Patient ID: Ana Nunez, female    DOB: 1929-11-21, 78 y.o.   MRN: [id number]  HPI  :  78 y.o.  WF with hx of AB/COPD, VCD , and MS   04/03/11  Breast CA Dx mammogram pos.  Recurrence from 2005, R  invasive ductal CA.  Small. Mass on L breast now as well. 5cm. X 2 x 1cm Sees Streck. ?bilateral mastectomy.  Sees Magrinat. Had XRT in the past on R. Now more neuro issues, memory issues and balance issues.  ADL issues. MRI is pending. Notes more cough than before and is heavy.  Notes some wheezing.  Is dyspneic with minimal exertion, no endurance.   >>no changes   05/16/2011 Acute OV  Complains of increased dyspnea and productive cough x 3wks. Coughing up yellow/green mucus, sob with exertion worsening. chest tightness, incresased wheezing.  Pt states she has been using her HFA more frequenting  in the past 3 wks.  No chest pain or hemoptysis .  Starting new chemo later this week. No evidence of metastatic dz .   Past Medical History  Diagnosis Date  . [name]   . Fatty liver   . GERD (gastroesophageal reflux disease)   . Type II or unspecified type diabetes mellitus without mention of complication, not stated as uncontrolled   . Positive PPD   . Abnormal involuntary movements   . Hypercholesteremia   . Hypothyroidism   . Unspecified urinary incontinence   . Vocal cord dysfunction   . Multiple sclerosis   . Hypertension   . Depression   . CAD (coronary artery disease)   . COPD (chronic obstructive pulmonary disease)   . Asthma   . Community acquired pneumonia   . Arthritis   . Adenocarcinoma, breast     Right breast  . Emphysema of lung   . Neuromuscular disorder     MS  . TIA (transient ischemic attack)   . Breast cancer      Family History  Problem Relation Age of Onset  . Coronary artery disease Father   . Heart disease Father   . Pancreatic cancer Mother   . Emphysema Mother   . Diabetes Mother   . COPD Mother   . Pancreatic cancer Sister   .  Cancer Sister     pancreatic  . Lymphoma Sister   . Cancer Sister     pancreatic  . Diabetes Sister   . Thyroid cancer Daughter   . Hypertension Sister   . Hypertension Brother   . Cancer Brother     CML  . Liver cancer Sister   . Cirrhosis Sister   . Cancer Maternal Aunt     breast  . Cancer Brother     bladder     History   Social History  . Marital Status: Married    Spouse Name: N/A    Number of Children: N/A  . Years of Education: N/A   Occupational History  . Housewife     former nurse   Social History Main Topics  . Smoking status: Former Smoker -- 1.5 packs/day for 25 years    Types: Cigarettes    Quit date: 02/07/1984  . Smokeless tobacco: Never Used  . Alcohol Use: Yes     1-2 glaases of wine daily  . Drug Use: No  . Sexually Active: Not on file   Other Topics Concern  . Not on file   Social History Narrative  . No  narrative on file     Allergies  Allergen Reactions  . Avelox (Moxifloxacin Hcl In Nacl) Rash    pruritis     Outpatient Prescriptions Prior to Visit  Medication Sig Dispense Refill  . albuterol (PROVENTIL HFA) 108 (90 BASE) MCG/ACT inhaler Inhale 2 puffs into the lungs every 6 (six) hours as needed.  3 Inhaler  1  . albuterol (PROVENTIL) (2.5 MG/3ML) 0.083% nebulizer solution Take 2.5 mg by nebulization every 4 (four) hours as needed.        Marland Kitchen aspirin 81 MG tablet Take 81 mg by mouth daily.        . benzonatate (TESSALON) 200 MG capsule Take 1 capsule (200 mg total) by mouth 3 (three) times daily as needed.  100 capsule  1  . Calcium Carbonate-Vitamin D (RA CALCIUM PLUS VITAMIN D) 600-400 MG-UNIT per tablet Take 1 tablet by mouth 2 (two) times daily.        . chlorpheniramine-HYDROcodone (TUSSIONEX PENNKINETIC ER) 10-8 MG/5ML LQCR Take 5 mLs by mouth every 12 (twelve) hours as needed.        . cloNIDine (CATAPRES) 0.3 MG tablet Take 1 tablet (0.3 mg total) by mouth 2 (two) times daily.  180 tablet  3  . cyclobenzaprine (FLEXERIL) 10  MG tablet 2 (two) times daily. Twice daily and as needed      . dextromethorphan-guaiFENesin (MUCINEX DM) 30-600 MG per 12 hr tablet Take 1 tablet by mouth 2 (two) times daily as needed.       . famotidine (PEPCID) 20 MG tablet Take 1 tablet (20 mg total) by mouth at bedtime.  90 tablet  3  . fesoterodine (TOVIAZ) 4 MG TB24 Take 4 mg by mouth daily.        . furosemide (LASIX) 20 MG tablet as needed. Take one tablet by mouth twice daily as needed for edema      . HYDROcodone-acetaminophen (VICODIN) 5-500 MG per tablet Take one tablet by mouth twice daily as needed for arthritis  90 tablet  0  . levothyroxine (SYNTHROID, LEVOTHROID) 137 MCG tablet Take 1 tablet (137 mcg total) by mouth daily.  90 tablet  3  . losartan (COZAAR) 50 MG tablet Take one tablet by mouth daily  90 tablet  3  . magnesium oxide (MAG-OX) 400 MG tablet Take 400 mg by mouth daily.        . metFORMIN (GLUMETZA) 500 MG (MOD) 24 hr tablet Take one tablet by mouth twice daily  180 tablet  3  . Multiple Vitamin (MULTIVITAMIN) capsule Take 1 capsule by mouth daily.        . Naproxen Sodium (ALEVE) 220 MG CAPS Take 1 capsule by mouth at bedtime as needed.        . pantoprazole (PROTONIX) 40 MG tablet 2 (two) times daily. Take 1 capsule by mouth daily      . polyethylene glycol (MIRALAX / GLYCOLAX) packet Take 17 g by mouth as needed.        . Probiotic Product (PROBIOTIC PO) Take 1 capsule by mouth daily. As needed with antibiotics      . tiotropium (SPIRIVA) 18 MCG inhalation capsule Place 1 capsule (18 mcg total) into inhaler and inhale daily.  90 capsule  1  . venlafaxine (EFFEXOR) 75 MG tablet Take 1 tablet (75 mg total) by mouth daily.  90 tablet  3      Review of Systems  Constitutional:   No  weight loss, night sweats,  Fevers,  chills,   +fatigue, lassitude. HEENT:   No headaches,  Difficulty swallowing,  Tooth/dental problems,  Sore throat,                No sneezing, itching, ear ache,  +nasal congestion, post nasal  drip,   CV:  No chest pain,  Orthopnea, PND, swelling in lower extremities, anasarca, dizziness, palpitations  GI  No heartburn, indigestion, abdominal pain, nausea, vomiting, diarrhea, change in bowel habits, loss of appetite  Resp: Notes  shortness of breath with exertion not    at rest.  Notes some  mucus, notes  productive cough,  Notes  non-productive cough,  No coughing up of blood. No chest wall deformity  Skin: no rash or lesions.  GU: no dysuria, change in color of urine, no urgency or frequency.  No flank pain.  MS:  No joint pain or swelling.  No decreased range of motion.  No back pain.  Psych:  No change in mood or affect. No depression or anxiety.  No memory loss.     Objective:   Physical Exam BP 122/82  Pulse 72  Temp(Src) 97.1 F (36.2 C) (Oral)  Ht 5\' 5"  (1.651 m)  Wt 237 lb 3.2 oz (107.593 kg)  BMI 39.47 kg/m2  SpO2 95%  Gen: Pleasant, well-nourished, in no distress,  normal affect  ENT: No lesions,  mouth clear,  oropharynx clear, no postnasal drip  Neck: No JVD, no TMG, no carotid bruits  Lungs: No use of accessory muscles, no dullness to percussion, coarse BS w/ faint exp wheeze   Cardiovascular: RRR, heart sounds normal, no murmur or gallops, no peripheral edema  Abdomen: soft and NT, no HSM,  BS normal  Musculoskeletal: No deformities, no cyanosis or clubbing  Neuro: alert, non focal  Skin: Warm, no lesions or rashes        Assessment & Plan:   No problem-specific assessment & plan notes found for this encounter.   Updated Medication List Outpatient Encounter Prescriptions as of 05/16/2011  Medication Sig Dispense Refill  . albuterol (PROVENTIL HFA) 108 (90 BASE) MCG/ACT inhaler Inhale 2 puffs into the lungs every 6 (six) hours as needed.  3 Inhaler  1  . albuterol (PROVENTIL) (2.5 MG/3ML) 0.083% nebulizer solution Take 2.5 mg by nebulization every 4 (four) hours as needed.        Marland Kitchen aspirin 81 MG tablet Take 81 mg by mouth daily.         . benzonatate (TESSALON) 200 MG capsule Take 1 capsule (200 mg total) by mouth 3 (three) times daily as needed.  100 capsule  1  . Calcium Carbonate-Vitamin D (RA CALCIUM PLUS VITAMIN D) 600-400 MG-UNIT per tablet Take 1 tablet by mouth 2 (two) times daily.        . chlorpheniramine-HYDROcodone (TUSSIONEX PENNKINETIC ER) 10-8 MG/5ML LQCR Take 5 mLs by mouth every 12 (twelve) hours as needed.        . cloNIDine (CATAPRES) 0.3 MG tablet Take 1 tablet (0.3 mg total) by mouth 2 (two) times daily.  180 tablet  3  . cyclobenzaprine (FLEXERIL) 10 MG tablet 2 (two) times daily. Twice daily and as needed      . dextromethorphan-guaiFENesin (MUCINEX DM) 30-600 MG per 12 hr tablet Take 1 tablet by mouth 2 (two) times daily as needed.       . famotidine (PEPCID) 20 MG tablet Take 1 tablet (20 mg total) by mouth at bedtime.  90 tablet  3  . fesoterodine (  TOVIAZ) 4 MG TB24 Take 4 mg by mouth daily.        . furosemide (LASIX) 20 MG tablet as needed. Take one tablet by mouth twice daily as needed for edema      . HYDROcodone-acetaminophen (VICODIN) 5-500 MG per tablet Take one tablet by mouth twice daily as needed for arthritis  90 tablet  0  . levothyroxine (SYNTHROID, LEVOTHROID) 137 MCG tablet Take 1 tablet (137 mcg total) by mouth daily.  90 tablet  3  . losartan (COZAAR) 50 MG tablet Take one tablet by mouth daily  90 tablet  3  . magnesium oxide (MAG-OX) 400 MG tablet Take 400 mg by mouth daily.        . metFORMIN (GLUMETZA) 500 MG (MOD) 24 hr tablet Take one tablet by mouth twice daily  180 tablet  3  . Multiple Vitamin (MULTIVITAMIN) capsule Take 1 capsule by mouth daily.        . Naproxen Sodium (ALEVE) 220 MG CAPS Take 1 capsule by mouth at bedtime as needed.        . pantoprazole (PROTONIX) 40 MG tablet 2 (two) times daily. Take 1 capsule by mouth daily      . polyethylene glycol (MIRALAX / GLYCOLAX) packet Take 17 g by mouth as needed.        . Probiotic Product (PROBIOTIC PO) Take 1 capsule by  mouth daily. As needed with antibiotics      . sennosides-docusate sodium (SENOKOT-S) 8.6-50 MG tablet Take 1 tablet by mouth daily.      Marland Kitchen tiotropium (SPIRIVA) 18 MCG inhalation capsule Place 1 capsule (18 mcg total) into inhaler and inhale daily.  90 capsule  1  . venlafaxine (EFFEXOR) 75 MG tablet Take 1 tablet (75 mg total) by mouth daily.  90 tablet  3

## 2011-05-16 NOTE — Patient Instructions (Signed)
Augmentin 875mg  Twice daily  For 7 days  Mucinex DM Twice daily  As needed  Cough/congestion  Prednisone taper over next week.  Please contact office for sooner follow up if symptoms do not improve or worsen or seek emergency care  follow up 2 months with Dr. Delford Field  and As needed

## 2011-05-18 ENCOUNTER — Ambulatory Visit: Payer: Medicare Other | Admitting: Critical Care Medicine

## 2011-05-21 ENCOUNTER — Other Ambulatory Visit: Payer: Self-pay | Admitting: Oncology

## 2011-06-19 ENCOUNTER — Encounter: Payer: Self-pay | Admitting: Critical Care Medicine

## 2011-06-19 ENCOUNTER — Ambulatory Visit (HOSPITAL_BASED_OUTPATIENT_CLINIC_OR_DEPARTMENT_OTHER)
Admission: RE | Admit: 2011-06-19 | Discharge: 2011-06-19 | Disposition: A | Discharge status: Home / Self Care | Payer: Medicare Other | Source: Ambulatory Visit | Attending: Critical Care Medicine | Admitting: Critical Care Medicine

## 2011-06-19 ENCOUNTER — Ambulatory Visit (INDEPENDENT_AMBULATORY_CARE_PROVIDER_SITE_OTHER): Payer: Medicare Other | Admitting: Critical Care Medicine

## 2011-06-19 VITALS — BP 120/74 | HR 122 | Temp 97.4°F | Ht 65.0 in | Wt 235.0 lb

## 2011-06-19 DIAGNOSIS — R05 Cough: Secondary | ICD-10-CM | POA: Insufficient documentation

## 2011-06-19 DIAGNOSIS — J4489 Other specified chronic obstructive pulmonary disease: Secondary | ICD-10-CM

## 2011-06-19 DIAGNOSIS — J209 Acute bronchitis, unspecified: Secondary | ICD-10-CM

## 2011-06-19 DIAGNOSIS — J441 Chronic obstructive pulmonary disease with (acute) exacerbation: Secondary | ICD-10-CM

## 2011-06-19 DIAGNOSIS — J449 Chronic obstructive pulmonary disease, unspecified: Secondary | ICD-10-CM

## 2011-06-19 DIAGNOSIS — R059 Cough, unspecified: Secondary | ICD-10-CM | POA: Insufficient documentation

## 2011-06-19 MED ORDER — PREDNISONE 10 MG PO TABS: ORAL_TABLET | ORAL | Status: DC

## 2011-06-19 MED ORDER — AZITHROMYCIN 250 MG PO TABS: 250.0000 mg | ORAL_TABLET | Freq: Every day | ORAL | Status: AC

## 2011-06-19 NOTE — Progress Notes (Signed)
Subjective:    Patient ID: Ana Nunez, female    DOB: May 31, 1929, 78 y.o.   MRN: [id number]  HPI  :  78 y.o.  WF with hx of AB/COPD, VCD , and MS   04/03/11  Breast CA Dx mammogram pos.  Recurrence from 2005, R  invasive ductal CA.  Small. Mass on L breast now as well. 5cm. X 2 x 1cm Sees Streck. ?bilateral mastectomy.  Sees Magrinat. Had XRT in the past on R. Now more neuro issues, memory issues and balance issues.  ADL issues. MRI is pending. Notes more cough than before and is heavy.  Notes some wheezing.  Is dyspneic with minimal exertion, no endurance.   >>no changes   4/9 Acute OV  Complains of increased dyspnea and productive cough x 3wks. Coughing up yellow/green mucus, sob with exertion worsening. chest tightness, incresased wheezing.  Pt states she has been using her HFA more frequenting  in the past 3 wks.  No chest pain or hemoptysis .  Starting new chemo later this week. No evidence of metastatic dz .   06/19/2011 Now more wheezing for two weeks.  Saw NP with similar and helped with abx/pred. Then overnight developed a severe cough paroxysm.    Past Medical History  Diagnosis Date  . [name]   . Fatty liver   . GERD (gastroesophageal reflux disease)   . Type II or unspecified type diabetes mellitus without mention of complication, not stated as uncontrolled   . Positive PPD   . Abnormal involuntary movements   . Hypercholesteremia   . Hypothyroidism   . Unspecified urinary incontinence   . Vocal cord dysfunction   . Multiple sclerosis   . Hypertension   . Depression   . CAD (coronary artery disease)   . COPD (chronic obstructive pulmonary disease)   . Asthma   . Community acquired pneumonia   . Arthritis   . Adenocarcinoma, breast     Right breast  . Emphysema of lung   . Neuromuscular disorder     MS  . TIA (transient ischemic attack)   . Breast cancer      Family History  Problem Relation Age of Onset  . Coronary artery disease Father   . Heart  disease Father   . Pancreatic cancer Mother   . Emphysema Mother   . Diabetes Mother   . COPD Mother   . Pancreatic cancer Sister   . Cancer Sister     pancreatic  . Lymphoma Sister   . Cancer Sister     pancreatic  . Diabetes Sister   . Thyroid cancer Daughter   . Hypertension Sister   . Hypertension Brother   . Cancer Brother     CML  . Liver cancer Sister   . Cirrhosis Sister   . Cancer Maternal Aunt     breast  . Cancer Brother     bladder     History   Social History  . Marital Status: Married    Spouse Name: N/A    Number of Children: N/A  . Years of Education: N/A   Occupational History  . Housewife     former nurse   Social History Main Topics  . Smoking status: Former Smoker -- 1.5 packs/day for 25 years    Types: Cigarettes    Quit date: 02/07/1984  . Smokeless tobacco: Never Used  . Alcohol Use: Yes     1-2 glaases of wine daily  . Drug Use:  No  . Sexually Active: Not on file   Other Topics Concern  . Not on file   Social History Narrative  . No narrative on file     Allergies  Allergen Reactions  . Avelox (Moxifloxacin Hcl In Nacl) Rash    pruritis     Outpatient Prescriptions Prior to Visit  Medication Sig Dispense Refill  . albuterol (PROVENTIL HFA) 108 (90 BASE) MCG/ACT inhaler Inhale 2 puffs into the lungs every 6 (six) hours as needed.  3 Inhaler  1  . albuterol (PROVENTIL) (2.5 MG/3ML) 0.083% nebulizer solution Take 2.5 mg by nebulization every 4 (four) hours as needed.        Marland Kitchen aspirin 81 MG tablet Take 81 mg by mouth daily.        . benzonatate (TESSALON) 200 MG capsule Take 1 capsule (200 mg total) by mouth 3 (three) times daily as needed.  100 capsule  1  . Calcium Carbonate-Vitamin D (RA CALCIUM PLUS VITAMIN D) 600-400 MG-UNIT per tablet Take 1 tablet by mouth 2 (two) times daily.        . chlorpheniramine-HYDROcodone (TUSSIONEX PENNKINETIC ER) 10-8 MG/5ML LQCR Take 5 mLs by mouth every 12 (twelve) hours as needed.        .  cloNIDine (CATAPRES) 0.3 MG tablet Take 1 tablet (0.3 mg total) by mouth 2 (two) times daily.  180 tablet  3  . cyclobenzaprine (FLEXERIL) 10 MG tablet 2 (two) times daily. Twice daily and as needed      . dextromethorphan-guaiFENesin (MUCINEX DM) 30-600 MG per 12 hr tablet Take 1 tablet by mouth 2 (two) times daily as needed.       . famotidine (PEPCID) 20 MG tablet Take 1 tablet (20 mg total) by mouth at bedtime.  90 tablet  3  . fesoterodine (TOVIAZ) 4 MG TB24 Take 4 mg by mouth daily.        . furosemide (LASIX) 20 MG tablet as needed. Take one tablet by mouth twice daily as needed for edema      . HYDROcodone-acetaminophen (VICODIN) 5-500 MG per tablet Take one tablet by mouth twice daily as needed for arthritis  90 tablet  0  . levothyroxine (SYNTHROID, LEVOTHROID) 137 MCG tablet Take 1 tablet (137 mcg total) by mouth daily.  90 tablet  3  . losartan (COZAAR) 50 MG tablet Take one tablet by mouth daily  90 tablet  3  . magnesium oxide (MAG-OX) 400 MG tablet Take 400 mg by mouth daily.        . metFORMIN (GLUMETZA) 500 MG (MOD) 24 hr tablet Take one tablet by mouth twice daily  180 tablet  3  . Multiple Vitamin (MULTIVITAMIN) capsule Take 1 capsule by mouth daily.        . Naproxen Sodium (ALEVE) 220 MG CAPS Take 1 capsule by mouth at bedtime as needed.        . pantoprazole (PROTONIX) 40 MG tablet 2 (two) times daily.       . polyethylene glycol (MIRALAX / GLYCOLAX) packet Take 17 g by mouth as needed.        . Probiotic Product (PROBIOTIC PO) Take 1 capsule by mouth. As needed with antibiotics      . sennosides-docusate sodium (SENOKOT-S) 8.6-50 MG tablet Take 1 tablet by mouth daily.      Marland Kitchen tiotropium (SPIRIVA) 18 MCG inhalation capsule Place 1 capsule (18 mcg total) into inhaler and inhale daily.  90 capsule  1  . venlafaxine (  EFFEXOR) 75 MG tablet Take 1 tablet (75 mg total) by mouth daily.  90 tablet  3  . predniSONE (DELTASONE) 10 MG tablet 76 tabs for 2 days, then 3 tabs for 2 days, 2  tabs for 2 days, then 1 tab for 2 days, then stop  20 tablet  0      Review of Systems  Constitutional:   No  weight loss, night sweats,  Fevers, chills,   +fatigue, lassitude. HEENT:   No headaches,  Difficulty swallowing,  Tooth/dental problems,  Sore throat,                No sneezing, itching, ear ache,  +nasal congestion, post nasal drip,   CV:  No chest pain,  Orthopnea, PND, swelling in lower extremities, anasarca, dizziness, palpitations  GI  No heartburn, indigestion, abdominal pain, nausea, vomiting, diarrhea, change in bowel habits, loss of appetite  Resp: Notes  shortness of breath with exertion not    at rest.  Notes some  mucus, notes  productive cough,  Notes  non-productive cough,  No coughing up of blood. No chest wall deformity  Skin: no rash or lesions.  GU: no dysuria, change in color of urine, no urgency or frequency.  No flank pain.  MS:  No joint pain or swelling.  No decreased range of motion.  No back pain.  Psych:  No change in mood or affect. No depression or anxiety.  No memory loss.     Objective:   Physical Exam BP 120/74  Pulse 122  Temp(Src) 97.4 F (36.3 C) (Oral)  Ht 5\' 5"  (1.651 m)  Wt 235 lb (106.595 kg)  BMI 39.11 kg/m2  SpO2 96%  Gen: Pleasant, well-nourished, in no distress,  normal affect  ENT: No lesions,  mouth clear,  oropharynx clear, no postnasal drip  Neck: No JVD, no TMG, no carotid bruits  Lungs: No use of accessory muscles, no dullness to percussion, coarse BS w/ exp wheeze , prominent pseudo-wheeze  Cardiovascular: RRR, heart sounds normal, no murmur or gallops, no peripheral edema  Abdomen: soft and NT, no HSM,  BS normal  Musculoskeletal: No deformities, no cyanosis or clubbing  Neuro: alert, non focal  Skin: Warm, no lesions or rashes     No results found.    Assessment & Plan:   COPD Golds D. COPD with recurrent exacerbation Plan Azithromycin for 5 days Prednisone pulse and  taper     Updated Medication List Outpatient Encounter Prescriptions as of 06/19/2011  Medication Sig Dispense Refill  . albuterol (PROVENTIL HFA) 108 (90 BASE) MCG/ACT inhaler Inhale 2 puffs into the lungs every 6 (six) hours as needed.  3 Inhaler  1  . albuterol (PROVENTIL) (2.5 MG/3ML) 0.083% nebulizer solution Take 2.5 mg by nebulization every 4 (four) hours as needed.        Marland Kitchen aspirin 81 MG tablet Take 81 mg by mouth daily.        . benzonatate (TESSALON) 200 MG capsule Take 1 capsule (200 mg total) by mouth 3 (three) times daily as needed.  100 capsule  1  . Calcium Carbonate-Vitamin D (RA CALCIUM PLUS VITAMIN D) 600-400 MG-UNIT per tablet Take 1 tablet by mouth 2 (two) times daily.        . chlorpheniramine-HYDROcodone (TUSSIONEX PENNKINETIC ER) 10-8 MG/5ML LQCR Take 5 mLs by mouth every 12 (twelve) hours as needed.        . cloNIDine (CATAPRES) 0.3 MG tablet Take 1 tablet (0.3 mg  total) by mouth 2 (two) times daily.  180 tablet  3  . cyclobenzaprine (FLEXERIL) 10 MG tablet 2 (two) times daily. Twice daily and as needed      . dextromethorphan-guaiFENesin (MUCINEX DM) 30-600 MG per 12 hr tablet Take 1 tablet by mouth 2 (two) times daily as needed.       . famotidine (PEPCID) 20 MG tablet Take 1 tablet (20 mg total) by mouth at bedtime.  90 tablet  3  . fesoterodine (TOVIAZ) 4 MG TB24 Take 4 mg by mouth daily.        . furosemide (LASIX) 20 MG tablet as needed. Take one tablet by mouth twice daily as needed for edema      . HYDROcodone-acetaminophen (VICODIN) 5-500 MG per tablet Take one tablet by mouth twice daily as needed for arthritis  90 tablet  0  . levothyroxine (SYNTHROID, LEVOTHROID) 137 MCG tablet Take 1 tablet (137 mcg total) by mouth daily.  90 tablet  3  . losartan (COZAAR) 50 MG tablet Take one tablet by mouth daily  90 tablet  3  . magnesium oxide (MAG-OX) 400 MG tablet Take 400 mg by mouth daily.        . metFORMIN (GLUMETZA) 500 MG (MOD) 24 hr tablet Take one tablet by  mouth twice daily  180 tablet  3  . Multiple Vitamin (MULTIVITAMIN) capsule Take 1 capsule by mouth daily.        . Naproxen Sodium (ALEVE) 220 MG CAPS Take 1 capsule by mouth at bedtime as needed.        . pantoprazole (PROTONIX) 40 MG tablet 2 (two) times daily.       . polyethylene glycol (MIRALAX / GLYCOLAX) packet Take 17 g by mouth as needed.        . Probiotic Product (PROBIOTIC PO) Take 1 capsule by mouth. As needed with antibiotics      . sennosides-docusate sodium (SENOKOT-S) 8.6-50 MG tablet Take 1 tablet by mouth daily.      Marland Kitchen tiotropium (SPIRIVA) 18 MCG inhalation capsule Place 1 capsule (18 mcg total) into inhaler and inhale daily.  90 capsule  1  . venlafaxine (EFFEXOR) 75 MG tablet Take 1 tablet (75 mg total) by mouth daily.  90 tablet  3  . azithromycin (ZITHROMAX) 250 MG tablet Take 1 tablet (250 mg total) by mouth daily. Take two once then one daily until gone  6 each  0  . exemestane (AROMASIN) 25 MG tablet Take 1 tablet by mouth Daily.      . predniSONE (DELTASONE) 10 MG tablet Take 4 for three days 3 for three days 2 for three days 1 for three days and stop  30 tablet  0  . DISCONTD: predniSONE (DELTASONE) 10 MG tablet 76 tabs for 2 days, then 3 tabs for 2 days, 2 tabs for 2 days, then 1 tab for 2 days, then stop  20 tablet  0

## 2011-06-19 NOTE — Assessment & Plan Note (Signed)
Golds D. COPD with recurrent exacerbation Plan Azithromycin for 5 days Prednisone pulse and taper

## 2011-06-19 NOTE — Progress Notes (Signed)
Quick Note:  Called, spoke with pt. I informed her of cxr results and recs per Dr. Wright. She verbalized understanding of this and voiced no further questions/concerns at this time. ______ 

## 2011-06-19 NOTE — Progress Notes (Signed)
Quick Note:  Notify the patient that the Xray is stable and no pneumonia No change in medications are recommended. Continue current meds as prescribed at last office visit ______ 

## 2011-06-19 NOTE — Patient Instructions (Addendum)
Prednisone 10mg  Take 4 for three days 3 for three days 2 for three days 1 for three days and stop Azithromycin 250mg  Take two once then one daily until gone Above meds sent to pharmacy downstairs A chest xray will be obtained Return 6 weeks [place]

## 2011-07-18 ENCOUNTER — Ambulatory Visit
Admission: RE | Admit: 2011-07-18 | Discharge: 2011-07-18 | Disposition: A | Discharge status: Home / Self Care | Payer: Medicare Other | Source: Ambulatory Visit | Attending: Oncology | Admitting: Oncology

## 2011-07-18 DIAGNOSIS — C50919 Malignant neoplasm of unspecified site of unspecified female breast: Secondary | ICD-10-CM

## 2011-07-18 MED ORDER — GADOBENATE DIMEGLUMINE 529 MG/ML IV SOLN
10.0000 mL | Freq: Once | INTRAVENOUS | Status: AC | PRN
  Administered 2011-07-18: 10 mL via INTRAVENOUS

## 2011-07-20 ENCOUNTER — Ambulatory Visit: Payer: Medicare Other | Admitting: Critical Care Medicine

## 2011-07-24 ENCOUNTER — Ambulatory Visit (HOSPITAL_BASED_OUTPATIENT_CLINIC_OR_DEPARTMENT_OTHER): Payer: Medicare Other | Admitting: Oncology

## 2011-07-24 ENCOUNTER — Other Ambulatory Visit (HOSPITAL_BASED_OUTPATIENT_CLINIC_OR_DEPARTMENT_OTHER): Payer: Medicare Other | Admitting: Lab

## 2011-07-24 ENCOUNTER — Telehealth: Payer: Self-pay | Admitting: Oncology

## 2011-07-24 VITALS — BP 165/75 | HR 109 | Temp 97.7°F | Ht 65.0 in | Wt 233.9 lb

## 2011-07-24 DIAGNOSIS — Z170 Estrogen receptor positive status [ER+]: Secondary | ICD-10-CM

## 2011-07-24 DIAGNOSIS — C50919 Malignant neoplasm of unspecified site of unspecified female breast: Secondary | ICD-10-CM

## 2011-07-24 DIAGNOSIS — Z853 Personal history of malignant neoplasm of breast: Secondary | ICD-10-CM

## 2011-07-24 LAB — CBC WITH DIFFERENTIAL/PLATELET
Eosinophils Absolute: 0.5 10*3/uL (ref 0.0–0.5)
HCT: 41 % (ref 34.8–46.6)
LYMPH%: 28.4 % (ref 14.0–49.7)
MONO#: 0.5 10*3/uL (ref 0.1–0.9)
NEUT#: 3.8 10*3/uL (ref 1.5–6.5)
NEUT%: 56.1 % (ref 38.4–76.8)
Platelets: 192 10*3/uL (ref 145–400)
RBC: 4.11 10*6/uL (ref 3.70–5.45)
WBC: 6.8 10*3/uL (ref 3.9–10.3)

## 2011-07-24 NOTE — Progress Notes (Signed)
ID: Rubie Maid   DOB: 08/08/1929  MR#: [id number]  CSN#:[id number]  HISTORY OF PRESENT ILLNESS Ana Nunez has a prior history of breast cancer as detailed below, and we had released her from our care April 2011. More recently however she had mammography showing a possible of recurrence in the right breast and MRIs suggesting abnormalities in both breasts. Biopsy of the right breast mass on 03/17/2011 showed ([id number]) an invasive ductal breast cancer which is 100% estrogen and 100% progesterone receptor positive, with an elevated proliferation marker at 45%, and importantly with amplification of HER-2, the ratio being 3.10. Biopsy of the left breast was benign.  INTERVAL HISTORY: Ana Nunez returns today with her husband and her daughter Ana Nunez for followup of her breast cancer. She has been on exemestane now for 3 months, and she is here chiefly to assess response to treatment so far.  REVIEW OF SYSTEMS: She continues to have significant breathing problems, with quite a bit of wheezing, and shortness of breath with minimal activity. Occasionally things "go the wrong way" when she swallows and she has a paroxysm of coughing. She has lost several more teeth since the last visit here. She is extremely tired, but still is able to get to her other daughter's home of 4 father's day, where she particularly enjoyed the chocolate cake. She has some urinary dribbling and urinary incontinence, continues to have of easy bruising, difficulty walking, arthritis pains, headaches, forgetfulness, anxiety, depression, and problems with her sugar and thyroid. Overall these problems seem to me about the same as before although the family tells me the dementia issue is progressive. A detailed review of systems today was otherwise stable.  PAST MEDICAL HISTORY: Past Medical History  Diagnosis Date  . [name]   . Fatty liver   . GERD (gastroesophageal reflux disease)   . Type II or unspecified type diabetes mellitus without mention  of complication, not stated as uncontrolled   . Positive PPD   . Tremor, involuntary spasm, or fasciculation   . Hypercholesteremia   . Hypothyroidism   . Unspecified urinary incontinence   . Vocal cord dysfunction   . Multiple sclerosis   . Hypertension   . Depression   . CAD (coronary artery disease)   . COPD (chronic obstructive pulmonary disease)   . Asthma   . Community acquired pneumonia   . Arthritis   . Adenocarcinoma, breast     Right breast  . Emphysema of lung   . Neuromuscular disorder     MS  . TIA (transient ischemic attack)   . Breast cancer     PAST SURGICAL HISTORY: Past Surgical History  Procedure Date  . Appendectomy 1942  . Tonsillectomy 1951  . Dilation and curettage of uterus 1964  . Nasal sinus surgery 1999  . Breast lumpectomy 1/06    Right  . Fracture surgery may 2010    R ankle     FAMILY HISTORY Family History  Problem Relation Age of Onset  . Coronary artery disease Father   . Heart disease Father   . Pancreatic cancer Mother   . Emphysema Mother   . Diabetes Mother   . COPD Mother   . Pancreatic cancer Sister   . Cancer Sister     pancreatic  . Lymphoma Sister   . Cancer Sister     pancreatic  . Diabetes Sister   . Thyroid cancer Daughter   . Hypertension Sister   . Hypertension Brother   . Cancer Brother  CML  . Liver cancer Sister   . Cirrhosis Sister   . Cancer Maternal Aunt     breast  . Cancer Brother     bladder  The patient's father died at the age of 59 from cardiac disease. The patient's mother died at the age of 63 from complications of diabetes and emphysema.  The patient has two brothers surviving.  She had three sisters; one died from pancreatic cancer, one with complications from transverse myelitis, the other from COPD.  GYNECOLOGIC HISTORY: She is G6, P5, parity age 35.  The patient's last menstrual period was in the 1980s. She never took hormone replacement.    SOCIAL HISTORY: She is a retired  Engineer, civil (consulting).  Her husband,  also retired is here with her today as is her daughter, Ana Nunez who used to be Archivist.  The other children are Ana Nunez, who lives in [place] and is a Medical illustrator.  Ana Nunez, who lives in [place] and works for a theme park.  Ana Nunez, who lives in [place] and is an Scientist, water quality; and Ana Nunez, who lives in [place] and works at [employer]. The patient has 13 grandchildren and one great-grandchild.  She is a member of [employer]. 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   ADVANCED DIRECTIVES: in place  HEALTH MAINTENANCE: History  Substance Use Topics  . Smoking status: Former Smoker -- 1.5 packs/day for 25 years    Types: Cigarettes    Quit date: 02/07/1984  . Smokeless tobacco: Never Used  . Alcohol Use: Yes     1-2 glaases of wine daily     Colonoscopy:  PAP:  Bone density:  Lipid panel:  Allergies  Allergen Reactions  . Avelox (Moxifloxacin Hcl In Nacl) Rash    pruritis    Current Outpatient Prescriptions  Medication Sig Dispense Refill  . albuterol (PROVENTIL HFA) 108 (90 BASE) MCG/ACT inhaler Inhale 2 puffs into the lungs every 6 (six) hours as needed.  3 Inhaler  1  . albuterol (PROVENTIL) (2.5 MG/3ML) 0.083% nebulizer solution Take 2.5 mg by nebulization every 4 (four) hours as needed.        Marland Kitchen aspirin 81 MG tablet Take 81 mg by mouth daily.        . benzonatate (TESSALON) 200 MG capsule Take 1 capsule (200 mg total) by mouth 3 (three) times daily as needed.  100 capsule  1  . Calcium Carbonate-Vitamin D (RA CALCIUM PLUS VITAMIN D) 600-400 MG-UNIT per tablet Take 1 tablet by mouth 2 (two) times daily.        . chlorpheniramine-HYDROcodone (TUSSIONEX PENNKINETIC ER) 10-8 MG/5ML LQCR Take 5 mLs by mouth every 12 (twelve) hours as needed.        . cloNIDine (CATAPRES) 0.3 MG tablet Take 1 tablet (0.3 mg total) by mouth 2 (two) times daily.  180 tablet  3  . cyclobenzaprine (FLEXERIL) 10 MG tablet 2 (two) times daily. Twice daily and as needed      .  dextromethorphan-guaiFENesin (MUCINEX DM) 30-600 MG per 12 hr tablet Take 1 tablet by mouth 2 (two) times daily as needed.       Marland Kitchen exemestane (AROMASIN) 25 MG tablet Take 1 tablet by mouth Daily.      . famotidine (PEPCID) 20 MG tablet Take 1 tablet (20 mg total) by mouth at bedtime.  90 tablet  3  . fesoterodine (TOVIAZ) 4 MG TB24 Take 4 mg by mouth daily.        . furosemide (LASIX) 20 MG tablet  as needed. Take one tablet by mouth twice daily as needed for edema      . HYDROcodone-acetaminophen (VICODIN) 5-500 MG per tablet Take one tablet by mouth twice daily as needed for arthritis  90 tablet  0  . levothyroxine (SYNTHROID, LEVOTHROID) 137 MCG tablet Take 1 tablet (137 mcg total) by mouth daily.  90 tablet  3  . losartan (COZAAR) 50 MG tablet Take one tablet by mouth daily  90 tablet  3  . magnesium oxide (MAG-OX) 400 MG tablet Take 400 mg by mouth daily.        . metFORMIN (GLUMETZA) 500 MG (MOD) 24 hr tablet Take one tablet by mouth twice daily  180 tablet  3  . Multiple Vitamin (MULTIVITAMIN) capsule Take 1 capsule by mouth daily.        . Naproxen Sodium (ALEVE) 220 MG CAPS Take 1 capsule by mouth at bedtime as needed.        . pantoprazole (PROTONIX) 40 MG tablet 2 (two) times daily.       . polyethylene glycol (MIRALAX / GLYCOLAX) packet Take 17 g by mouth as needed.        . predniSONE (DELTASONE) 10 MG tablet Take 4 for three days 3 for three days 2 for three days 1 for three days and stop  30 tablet  0  . Probiotic Product (PROBIOTIC PO) Take 1 capsule by mouth. As needed with antibiotics      . sennosides-docusate sodium (SENOKOT-S) 8.6-50 MG tablet Take 1 tablet by mouth daily.      Marland Kitchen tiotropium (SPIRIVA) 18 MCG inhalation capsule Place 1 capsule (18 mcg total) into inhaler and inhale daily.  90 capsule  1  . venlafaxine (EFFEXOR) 75 MG tablet Take 1 tablet (75 mg total) by mouth daily.  90 tablet  3    OBJECTIVE: elderly white woman examined sitting up, unable to climb to the  examination table. Filed Vitals:   07/24/11 1129  BP: 165/75  Pulse: 109  Temp: 97.7 F (36.5 C)     Body mass index is 38.92 kg/(m^2).    ECOG FS: 2  Sclerae unicteric Oropharynx clear No peripheral adenopathy and particularly no evidence of adenopathy in the right axilla Lungs no rales or rhonchi, extensive wheezing with the activity of taking off her shirt. Heart regular rate and rhythm, no murmur appreciated Abd obese, benign MSK no focal spinal tenderness Neuro: nonfocal Breasts: Right breast status post remote lumpectomy. I do not palpate a mass; the left breast shows no suspicious findings; both nipples are flat  LAB RESULTS: Lab Results  Component Value Date   WBC 6.8 07/24/2011   NEUTROABS 3.8 07/24/2011   HGB 13.9 07/24/2011   HCT 41.0 07/24/2011   MCV 99.8 07/24/2011   PLT 192 07/24/2011      Chemistry      Component Value Date/Time   NA 136 04/05/2011 1146   K 4.8 04/05/2011 1146   CL 102 04/05/2011 1146   CO2 25 04/05/2011 1146   BUN 31* 04/05/2011 1146   CREATININE 1.25* 04/05/2011 1146      Component Value Date/Time   CALCIUM 9.2 04/05/2011 1146   ALKPHOS 108 04/05/2011 1146   AST 37 04/05/2011 1146   ALT 32 04/05/2011 1146   BILITOT 0.3 04/05/2011 1146       Lab Results  Component Value Date   LABCA2 28 04/05/2011    No results found for this basename: INR:1;PROTIME:1 in the last 168 hours  No results found for this basename: UACOL:1,UAPR:1,USPG:1,UPH:1,UTP:1,UGL:1,UKET:1,UBIL:1,UHGB:1,UNIT:1,UROB:1,ULEU:1,UEPI:1,UWBC:1,URBC:1,UBAC:1,CAST:1,CRYS:1,UCOM:1,BILUA:1 in the last 72 hours   STUDIES: Mr Breast Bilateral W Wo Contrast  07/20/2011  *RADIOLOGY REPORT*  Clinical Data: Assess response to neoadjuvant chemotherapy.  The patient underwent right lumpectomy in 2006, and was diagnosed with recurrence at the lumpectomy site and March 29, 2011.  BUN and creatinine were obtained on site at [facility] at [address] Results:  BUN 26 mg/dL,   Creatinine 1.4 mg/dL.  BILATERAL BREAST MRI WITH AND WITHOUT CONTRAST  Technique: Multiplanar, multisequence MR images of both breasts were obtained prior to and following the intravenous administration of 10ml of Multihance.  Three dimensional images were evaluated at the independent DynaCad workstation.  Comparison:  03/24/2011  Findings: Mild background parenchymal enhancement.  The subareolar enhancement on the left that was biopsied after the prior MRI no longer persists as a defined and clumped linear process.  There is still some asymmetric non mass-like enhancement in this area associated with the biopsy marker clip, and review of prior MRI guided biopsy of this area reveals benign results.  On the left, there is lumpectomy change in the posterior lateral right breast.  This mass previously measured 8 x 8 x 6 mm and now measures 8 x 7 x 6 mm.  It shows interval decrease in enhancement with essentially uniform slow enhancement with persistent kinetics. There is no other significant interval change.  IMPRESSION: Minimal interval decrease in size plus decrease in the degree of enhancement of the known right breast carcinoma.  RECOMMENDATION:  BI-RADS CATEGORY 6:  Known biopsy-proven malignancy - appropriate action should be taken.  THREE-DIMENSIONAL MR IMAGE RENDERING ON INDEPENDENT WORKSTATION:  Three-dimensional MR images were rendered by post-processing of the original MR data on an independent workstation.  The three- dimensional MR images were interpreted, and findings were reported in the accompanying complete MRI report for this study.  Original Report Authenticated By: [id number]     ASSESSMENT: 78 year old [place] woman,  (1) status post right lumpectomy and sentinel lymph node biopsy January of 2006 for an ER and PR positive, HER-2 negative invasive ductal carcinoma measuring 1.4 cm, grade 2, with a micrometastatic deposit in 1 out of 3 lymph nodes, status post radiation, on anastrozole between  April of 2006 and April 2011  (2) now with a triple positive right-sided breast cancer, clinically T1b N0, with an Mib-1 of 45%  (3) started exemestane March 2013  PLAN: She is tolerating the exemestane quite well, is not expensive for her, and it is a ready showing some evidence of response. Accordingly the plan is for her to return here in October. We will repeat an MRI before that visit. If everything is going well at that point she will see [name] again in April and we would repeat an MRI before that visit as well. Again the overall plan is to avoid surgery, and use anti-estrogens as long as possible given her multiple other comorbid conditions. All this was discussed of in detail and openly today. The patient and her family are very much in agreement with this plan.  Note that the patient has a living will and DO NOT RESUSCITATE in place and in case of a terminal event she would not want to be resuscitated.  Dana Dorner C    07/24/2011

## 2011-07-24 NOTE — Telephone Encounter (Signed)
gve the pt her oct 2013 appt calendar. The pt is aware she will be called with the breast mri from [facility]. Pt prefers her mri appt at [facility]

## 2011-08-01 ENCOUNTER — Telehealth: Payer: Self-pay | Admitting: Critical Care Medicine

## 2011-08-01 MED ORDER — PANTOPRAZOLE SODIUM 40 MG PO TBEC: 40.0000 mg | DELAYED_RELEASE_TABLET | Freq: Two times a day (BID) | ORAL | Status: DC

## 2011-08-01 NOTE — Telephone Encounter (Signed)
90 day protonix rx sent to [facility].  Kim aware.

## 2011-08-17 ENCOUNTER — Encounter: Payer: Self-pay | Admitting: Critical Care Medicine

## 2011-08-17 ENCOUNTER — Ambulatory Visit (INDEPENDENT_AMBULATORY_CARE_PROVIDER_SITE_OTHER): Payer: Medicare Other | Admitting: Critical Care Medicine

## 2011-08-17 VITALS — BP 130/78 | HR 84 | Temp 97.7°F | Ht 65.0 in | Wt 232.0 lb

## 2011-08-17 DIAGNOSIS — J449 Chronic obstructive pulmonary disease, unspecified: Secondary | ICD-10-CM

## 2011-08-17 DIAGNOSIS — J4489 Other specified chronic obstructive pulmonary disease: Secondary | ICD-10-CM

## 2011-08-17 NOTE — Patient Instructions (Signed)
No change in medications. Return in   3 months 

## 2011-08-17 NOTE — Progress Notes (Signed)
Subjective:    Patient ID: Ana Nunez, female    DOB: 1929-09-26, 78 y.o.   MRN: [id number]  HPI  :  78 y.o.  WF with hx of AB/COPD, VCD , and MS   08/17/2011 Pt not on any chemorx at this time.  Last MRI ok.  Cough and breathing is better .   Sees Mag[facility]at again Sept. No real chest pain.  Notes only the upper airway wheeze.   Cough is much better.  No hemoptysis Pt denies any significant sore throat, nasal congestion or excess secretions, fever, chills, sweats, unintended weight loss, pleurtic or exertional chest pain, orthopnea PND, or leg swelling Pt denies any increase in rescue therapy over baseline, denies waking up needing it or having any early am or nocturnal exacerbations of coughing/wheezing/or dyspnea. Pt also denies any obvious fluctuation in symptoms with  weather or environmental change or other alleviating or aggravating factors   Past Medical History  Diagnosis Date  . [name]   . Fatty liver   . GERD (gastroesophageal reflux disease)   . Type II or unspecified type diabetes mellitus without mention of complication, not stated as uncontrolled   . Positive PPD   . Abnormal involuntary movements   . Hypercholesteremia   . Hypothyroidism   . Unspecified urinary incontinence   . Vocal cord dysfunction   . Multiple sclerosis   . Hypertension   . Depression   . CAD (coronary artery disease)   . COPD (chronic obstructive pulmonary disease)   . Asthma   . Community acquired pneumonia   . Arthritis   . Adenocarcinoma, breast     Right breast  . Emphysema of lung   . Neuromuscular disorder     MS  . TIA (transient ischemic attack)   . Breast cancer      Family History  Problem Relation Age of Onset  . Coronary artery disease Father   . Heart disease Father   . Pancreatic cancer Mother   . Emphysema Mother   . Diabetes Mother   . COPD Mother   . Pancreatic cancer Sister   . Cancer Sister     pancreatic  . Lymphoma Sister   . Cancer Sister    pancreatic  . Diabetes Sister   . Thyroid cancer Daughter   . Hypertension Sister   . Hypertension Brother   . Cancer Brother     CML  . Liver cancer Sister   . Cirrhosis Sister   . Cancer Maternal Aunt     breast  . Cancer Brother     bladder     History   Social History  . Marital Status: Married    Spouse Name: N/A    Number of Children: N/A  . Years of Education: N/A   Occupational History  . Housewife     former nurse   Social History Main Topics  . Smoking status: Former Smoker -- 1.5 packs/day for 25 years    Types: Cigarettes    Quit date: 02/07/1984  . Smokeless tobacco: Never Used  . Alcohol Use: Yes     1-2 glaases of wine daily  . Drug Use: No  . Sexually Active: Not on file   Other Topics Concern  . Not on file   Social History Narrative  . No narrative on file     Allergies  Allergen Reactions  . Avelox (Moxifloxacin Hcl In Nacl) Rash    pruritis     Outpatient Prescriptions Prior to  Visit  Medication Sig Dispense Refill  . albuterol (PROVENTIL HFA) 108 (90 BASE) MCG/ACT inhaler Inhale 2 puffs into the lungs every 6 (six) hours as needed.  3 Inhaler  1  . albuterol (PROVENTIL) (2.5 MG/3ML) 0.083% nebulizer solution Take 2.5 mg by nebulization every 4 (four) hours as needed.        Marland Kitchen aspirin 81 MG tablet Take 81 mg by mouth daily.        . benzonatate (TESSALON) 200 MG capsule Take 1 capsule (200 mg total) by mouth 3 (three) times daily as needed.  100 capsule  1  . Calcium Carbonate-Vitamin D (RA CALCIUM PLUS VITAMIN D) 600-400 MG-UNIT per tablet Take 1 tablet by mouth 2 (two) times daily.        . chlorpheniramine-HYDROcodone (TUSSIONEX PENNKINETIC ER) 10-8 MG/5ML LQCR Take 5 mLs by mouth every 12 (twelve) hours as needed.        . cloNIDine (CATAPRES) 0.3 MG tablet Take 1 tablet (0.3 mg total) by mouth 2 (two) times daily.  180 tablet  3  . cyclobenzaprine (FLEXERIL) 10 MG tablet 2 (two) times daily. Twice daily and as needed      .  dextromethorphan-guaiFENesin (MUCINEX DM) 30-600 MG per 12 hr tablet Take 1 tablet by mouth 2 (two) times daily as needed.       Marland Kitchen exemestane (AROMASIN) 25 MG tablet Take 1 tablet by mouth Daily.      . famotidine (PEPCID) 20 MG tablet Take 1 tablet (20 mg total) by mouth at bedtime.  90 tablet  3  . fesoterodine (TOVIAZ) 4 MG TB24 Take 4 mg by mouth daily.        . furosemide (LASIX) 20 MG tablet as needed. Take one tablet by mouth twice daily as needed for edema      . HYDROcodone-acetaminophen (VICODIN) 5-500 MG per tablet Take one tablet by mouth twice daily as needed for arthritis  90 tablet  0  . levothyroxine (SYNTHROID, LEVOTHROID) 137 MCG tablet Take 1 tablet (137 mcg total) by mouth daily.  90 tablet  3  . losartan (COZAAR) 50 MG tablet Take one tablet by mouth daily  90 tablet  3  . magnesium oxide (MAG-OX) 400 MG tablet Take 400 mg by mouth daily.        . metFORMIN (GLUMETZA) 500 MG (MOD) 24 hr tablet Take one tablet by mouth twice daily  180 tablet  3  . Multiple Vitamin (MULTIVITAMIN) capsule Take 1 capsule by mouth daily.        . Naproxen Sodium (ALEVE) 220 MG CAPS Take 1 capsule by mouth at bedtime as needed.        . pantoprazole (PROTONIX) 40 MG tablet Take 1 tablet (40 mg total) by mouth 2 (two) times daily.  180 tablet  1  . polyethylene glycol (MIRALAX / GLYCOLAX) packet Take 17 g by mouth as needed.        . sennosides-docusate sodium (SENOKOT-S) 8.6-50 MG tablet Take 1 tablet by mouth daily.      Marland Kitchen tiotropium (SPIRIVA) 18 MCG inhalation capsule Place 1 capsule (18 mcg total) into inhaler and inhale daily.  90 capsule  1  . venlafaxine (EFFEXOR) 75 MG tablet Take 1 tablet (75 mg total) by mouth daily.  90 tablet  3  . predniSONE (DELTASONE) 10 MG tablet Take 4 for three days 3 for three days 2 for three days 1 for three days and stop  30 tablet  0  .  Probiotic Product (PROBIOTIC PO) Take 1 capsule by mouth as needed. As needed with antibiotics          Review of  Systems  Constitutional:   No  weight loss, night sweats,  Fevers, chills,   +fatigue, lassitude. HEENT:   No headaches,  Difficulty swallowing,  Tooth/dental problems,  Sore throat,                No sneezing, itching, ear ache,  +nasal congestion, post nasal drip,   CV:  No chest pain,  Orthopnea, PND, swelling in lower extremities, anasarca, dizziness, palpitations  GI  No heartburn, indigestion, abdominal pain, nausea, vomiting, diarrhea, change in bowel habits, loss of appetite  Resp: Notes  shortness of breath with exertion not    at rest.  Notes some  mucus, notes  productive cough,  Notes  non-productive cough,  No coughing up of blood. No chest wall deformity  Skin: no rash or lesions.  GU: no dysuria, change in color of urine, no urgency or frequency.  No flank pain.  MS:  No joint pain or swelling.  No decreased range of motion.  No back pain.  Psych:  No change in mood or affect. No depression or anxiety.  No memory loss.     Objective:   Physical Exam BP 130/78  Pulse 84  Temp 97.7 F (36.5 C) (Oral)  Ht 5\' 5"  (1.651 m)  Wt 232 lb (105.235 kg)  BMI 38.61 kg/m2  SpO2 97%  Gen: Pleasant, well-nourished, in no distress,  normal affect  ENT: No lesions,  mouth clear,  oropharynx clear, no postnasal drip  Neck: No JVD, no TMG, no carotid bruits  Lungs: No use of accessory muscles, no dullness to percussion, coarse BS ,  mild pseudo-wheeze  Cardiovascular: RRR, heart sounds normal, no murmur or gallops, no peripheral edema  Abdomen: soft and NT, no HSM,  BS normal  Musculoskeletal: No deformities, no cyanosis or clubbing  Neuro: alert, non focal  Skin: Warm, no lesions or rashes     No results found.    Assessment & Plan:   COPD Gold stage D. COPD stable at this time  Plan Maintain inhaled medications as prescribed Return 2 months    Updated Medication List Outpatient Encounter Prescriptions as of 08/17/2011  Medication Sig Dispense Refill   . albuterol (PROVENTIL HFA) 108 (90 BASE) MCG/ACT inhaler Inhale 2 puffs into the lungs every 6 (six) hours as needed.  3 Inhaler  1  . albuterol (PROVENTIL) (2.5 MG/3ML) 0.083% nebulizer solution Take 2.5 mg by nebulization every 4 (four) hours as needed.        Marland Kitchen aspirin 81 MG tablet Take 81 mg by mouth daily.        . benzonatate (TESSALON) 200 MG capsule Take 1 capsule (200 mg total) by mouth 3 (three) times daily as needed.  100 capsule  1  . Calcium Carbonate-Vitamin D (RA CALCIUM PLUS VITAMIN D) 600-400 MG-UNIT per tablet Take 1 tablet by mouth 2 (two) times daily.        . chlorpheniramine-HYDROcodone (TUSSIONEX PENNKINETIC ER) 10-8 MG/5ML LQCR Take 5 mLs by mouth every 12 (twelve) hours as needed.        . cloNIDine (CATAPRES) 0.3 MG tablet Take 1 tablet (0.3 mg total) by mouth 2 (two) times daily.  180 tablet  3  . cyclobenzaprine (FLEXERIL) 10 MG tablet 2 (two) times daily. Twice daily and as needed      . dextromethorphan-guaiFENesin (  MUCINEX DM) 30-600 MG per 12 hr tablet Take 1 tablet by mouth 2 (two) times daily as needed.       Marland Kitchen exemestane (AROMASIN) 25 MG tablet Take 1 tablet by mouth Daily.      . famotidine (PEPCID) 20 MG tablet Take 1 tablet (20 mg total) by mouth at bedtime.  90 tablet  3  . fesoterodine (TOVIAZ) 4 MG TB24 Take 4 mg by mouth daily.        . furosemide (LASIX) 20 MG tablet as needed. Take one tablet by mouth twice daily as needed for edema      . HYDROcodone-acetaminophen (VICODIN) 5-500 MG per tablet Take one tablet by mouth twice daily as needed for arthritis  90 tablet  0  . levothyroxine (SYNTHROID, LEVOTHROID) 137 MCG tablet Take 1 tablet (137 mcg total) by mouth daily.  90 tablet  3  . losartan (COZAAR) 50 MG tablet Take one tablet by mouth daily  90 tablet  3  . magnesium oxide (MAG-OX) 400 MG tablet Take 400 mg by mouth daily.        . metFORMIN (GLUMETZA) 500 MG (MOD) 24 hr tablet Take one tablet by mouth twice daily  180 tablet  3  . Multiple  Vitamin (MULTIVITAMIN) capsule Take 1 capsule by mouth daily.        . Naproxen Sodium (ALEVE) 220 MG CAPS Take 1 capsule by mouth at bedtime as needed.        . pantoprazole (PROTONIX) 40 MG tablet Take 1 tablet (40 mg total) by mouth 2 (two) times daily.  180 tablet  1  . polyethylene glycol (MIRALAX / GLYCOLAX) packet Take 17 g by mouth as needed.        . sennosides-docusate sodium (SENOKOT-S) 8.6-50 MG tablet Take 1 tablet by mouth daily.      Marland Kitchen tiotropium (SPIRIVA) 18 MCG inhalation capsule Place 1 capsule (18 mcg total) into inhaler and inhale daily.  90 capsule  1  . venlafaxine (EFFEXOR) 75 MG tablet Take 1 tablet (75 mg total) by mouth daily.  90 tablet  3  . DISCONTD: predniSONE (DELTASONE) 10 MG tablet Take 4 for three days 3 for three days 2 for three days 1 for three days and stop  30 tablet  0  . DISCONTD: predniSONE (DELTASONE) 10 MG tablet as needed. Take 4 for three days 3 for three days 2 for three days 1 for three days and stop      . DISCONTD: Probiotic Product (PROBIOTIC PO) Take 1 capsule by mouth as needed. As needed with antibiotics

## 2011-08-17 NOTE — Assessment & Plan Note (Signed)
Gold stage D. COPD stable at this time  Plan Maintain inhaled medications as prescribed Return 2 months

## 2011-09-05 ENCOUNTER — Other Ambulatory Visit: Payer: Self-pay | Admitting: *Deleted

## 2011-09-05 NOTE — Telephone Encounter (Signed)
Faxed request from [facility] for refill on metformin.  Pt has not been here in over a year, doesn't have any upcoming appts scheduled with you.  She has been seeing specialists.

## 2011-09-06 MED ORDER — METFORMIN HCL ER 500 MG PO TB24: 500.0000 mg | ORAL_TABLET | Freq: Two times a day (BID) | ORAL | Status: DC

## 2011-09-11 ENCOUNTER — Other Ambulatory Visit: Payer: Self-pay | Admitting: *Deleted

## 2011-09-11 MED ORDER — METFORMIN HCL ER 500 MG PO TB24: 500.0000 mg | ORAL_TABLET | Freq: Two times a day (BID) | ORAL | Status: DC

## 2011-09-13 ENCOUNTER — Other Ambulatory Visit: Payer: Self-pay

## 2011-09-13 MED ORDER — METFORMIN HCL ER 500 MG PO TB24: 500.0000 mg | ORAL_TABLET | Freq: Two times a day (BID) | ORAL | Status: DC

## 2011-09-13 NOTE — Telephone Encounter (Signed)
Kim pts daughter said Metformin sent to [facility] instead of [facility]. I apologized, pt has med until rx comes from [facility]. Metformin sent to [facility] 09/11/11 refill cancelled.

## 2011-09-18 ENCOUNTER — Ambulatory Visit (INDEPENDENT_AMBULATORY_CARE_PROVIDER_SITE_OTHER): Payer: Medicare Other | Admitting: Family Medicine

## 2011-09-18 ENCOUNTER — Encounter: Payer: Self-pay | Admitting: Family Medicine

## 2011-09-18 VITALS — BP 124/72 | HR 76 | Temp 97.6°F | Wt 186.0 lb

## 2011-09-18 DIAGNOSIS — G35 Multiple sclerosis: Secondary | ICD-10-CM

## 2011-09-18 DIAGNOSIS — R413 Other amnesia: Secondary | ICD-10-CM

## 2011-09-18 MED ORDER — DONEPEZIL HCL 5 MG PO TABS: 5.0000 mg | ORAL_TABLET | Freq: Every day | ORAL | Status: DC

## 2011-09-18 NOTE — Progress Notes (Signed)
Subjective:    Patient ID: Ana Nunez, female    DOB: 1929-11-30, 78 y.o.   MRN: [id number]  HPI  Pleasant 78 yo female with h/o MS, COPD and recurrent breast CA here to discuss dementia.  Followed by [name], last saw him a few months ago.  Here with pt and her daughter- I do not have most recent note from [name] but pt's daughter, [name] concurs with his previous notes that stated he was not sure how much of her memory loss was due to MS vs. Dementia and he felt she should hold off on starting meds.  Since her last appt with him, both [name] and her daughter, [name] feel her symptoms have progressed.  She is now calling people by the wrong names and seeing people who have deceased. She is forgetting how to get to the bathroom in her daughter's home who has lived there for years.  Her mother had dementia in her 38s or 58s.  Feels her depression has been stable although has been on Effexor for years.    Has had several significant changes- including recurrence of breast CA- most recent scan showed no significant progression.  Patient Active Problem List  Diagnosis  . ADENOCARCINOMA, BREAST  . COLONIC POLYPS  . HYPOTHYROIDISM  . DIABETES MELLITUS, TYPE II  . DEPRESSION  . SCLEROSIS, MULTIPLE  . HYPERTENSION  . VOCAL CORD DISORDER  . ASTHMA  . COPD  . GERD  . GASTRITIS, CHRONIC  . CONSTIPATION  . [name]  . NECK PAIN  . INVOLUNTARY MOVEMENTS, ABNORMAL  . DYSPHAGIA  . URINARY INCONTINENCE  . POSITIVE PPD  . Routine general medical examination at a health care facility  . Recurrent breast cancer  . Memory loss   Past Medical History  Diagnosis Date  . [name]   . Fatty liver   . GERD (gastroesophageal reflux disease)   . Type II or unspecified type diabetes mellitus without mention of complication, not stated as uncontrolled   . Positive PPD   . Abnormal involuntary movements   . Hypercholesteremia   . Hypothyroidism   . Unspecified urinary incontinence   . Vocal  cord dysfunction   . Multiple sclerosis   . Hypertension   . Depression   . CAD (coronary artery disease)   . COPD (chronic obstructive pulmonary disease)   . Asthma   . Community acquired pneumonia   . Arthritis   . Adenocarcinoma, breast     Right breast  . Emphysema of lung   . Neuromuscular disorder     MS  . TIA (transient ischemic attack)   . Breast cancer    Past Surgical History  Procedure Date  . Appendectomy 1942  . Tonsillectomy 1951  . Dilation and curettage of uterus 1964  . Nasal sinus surgery 1999  . Breast lumpectomy 1/06    Right  . Fracture surgery may 2010    R ankle    History  Substance Use Topics  . Smoking status: Former Smoker -- 1.5 packs/day for 25 years    Types: Cigarettes    Quit date: 02/07/1984  . Smokeless tobacco: Never Used  . Alcohol Use: Yes     1-2 glaases of wine daily   Family History  Problem Relation Age of Onset  . Coronary artery disease Father   . Heart disease Father   . Pancreatic cancer Mother   . Emphysema Mother   . Diabetes Mother   . COPD Mother   .  Pancreatic cancer Sister   . Cancer Sister     pancreatic  . Lymphoma Sister   . Cancer Sister     pancreatic  . Diabetes Sister   . Thyroid cancer Daughter   . Hypertension Sister   . Hypertension Brother   . Cancer Brother     CML  . Liver cancer Sister   . Cirrhosis Sister   . Cancer Maternal Aunt     breast  . Cancer Brother     bladder   Allergies  Allergen Reactions  . Avelox (Moxifloxacin Hcl In Nacl) Rash    pruritis   Current Outpatient Prescriptions on File Prior to Visit  Medication Sig Dispense Refill  . albuterol (PROVENTIL HFA) 108 (90 BASE) MCG/ACT inhaler Inhale 2 puffs into the lungs every 6 (six) hours as needed.  3 Inhaler  1  . albuterol (PROVENTIL) (2.5 MG/3ML) 0.083% nebulizer solution Take 2.5 mg by nebulization every 4 (four) hours as needed.        Marland Kitchen aspirin 81 MG tablet Take 81 mg by mouth daily.        . benzonatate  (TESSALON) 200 MG capsule Take 1 capsule (200 mg total) by mouth 3 (three) times daily as needed.  100 capsule  1  . Calcium Carbonate-Vitamin D (RA CALCIUM PLUS VITAMIN D) 600-400 MG-UNIT per tablet Take 1 tablet by mouth 2 (two) times daily.        . chlorpheniramine-HYDROcodone (TUSSIONEX PENNKINETIC ER) 10-8 MG/5ML LQCR Take 5 mLs by mouth every 12 (twelve) hours as needed.        . cloNIDine (CATAPRES) 0.3 MG tablet Take 1 tablet (0.3 mg total) by mouth 2 (two) times daily.  180 tablet  3  . cyclobenzaprine (FLEXERIL) 10 MG tablet 2 (two) times daily. Twice daily and as needed      . dextromethorphan-guaiFENesin (MUCINEX DM) 30-600 MG per 12 hr tablet Take 1 tablet by mouth 2 (two) times daily as needed.       Marland Kitchen exemestane (AROMASIN) 25 MG tablet Take 1 tablet by mouth Daily.      . famotidine (PEPCID) 20 MG tablet Take 1 tablet (20 mg total) by mouth at bedtime.  90 tablet  3  . fesoterodine (TOVIAZ) 4 MG TB24 Take 4 mg by mouth daily.        . furosemide (LASIX) 20 MG tablet as needed. Take one tablet by mouth twice daily as needed for edema      . HYDROcodone-acetaminophen (VICODIN) 5-500 MG per tablet Take one tablet by mouth twice daily as needed for arthritis  90 tablet  0  . levothyroxine (SYNTHROID, LEVOTHROID) 137 MCG tablet Take 1 tablet (137 mcg total) by mouth daily.  90 tablet  3  . losartan (COZAAR) 50 MG tablet Take one tablet by mouth daily  90 tablet  3  . magnesium oxide (MAG-OX) 400 MG tablet Take 400 mg by mouth daily.        . metFORMIN (GLUCOPHAGE-XR) 500 MG 24 hr tablet Take 1 tablet (500 mg total) by mouth 2 (two) times daily.  180 tablet  0  . Multiple Vitamin (MULTIVITAMIN) capsule Take 1 capsule by mouth daily.        . Naproxen Sodium (ALEVE) 220 MG CAPS Take 1 capsule by mouth at bedtime as needed.        . pantoprazole (PROTONIX) 40 MG tablet Take 1 tablet (40 mg total) by mouth 2 (two) times daily.  180 tablet  1  . polyethylene glycol (MIRALAX / GLYCOLAX) packet  Take 17 g by mouth as needed.        . sennosides-docusate sodium (SENOKOT-S) 8.6-50 MG tablet Take 1 tablet by mouth daily.      Marland Kitchen tiotropium (SPIRIVA) 18 MCG inhalation capsule Place 1 capsule (18 mcg total) into inhaler and inhale daily.  90 capsule  1  . venlafaxine (EFFEXOR) 75 MG tablet Take 1 tablet (75 mg total) by mouth daily.  90 tablet  3  . donepezil (ARICEPT) 5 MG tablet Take 1 tablet (5 mg total) by mouth at bedtime.  30 tablet  6   The PMH, PSH, Social History, Family History, Medications, and allergies have been reviewed in [facility], and have been updated if relevant.  Review of Systems    See HPI Objective:   Physical Exam BP 124/72  Pulse 76  Temp 97.6 F (36.4 C)  Wt 186 lb (84.369 kg) Gen:  Alert, pleasant, missing several front teeth (continues to loose teeth) Psych:  Good eye contact, alert and pleasant.    Assessment & Plan:   1. Depression Appears stable- no indication of pseudo dementia   2. Memory loss  >25 min spent with face to face with patient, >50% counseling and/or coordinating care   Deteriorated and does seem to have a component of early dementia. DId quite well on MMSE (could not recall objects and needed prompting to remember date). Discussed possible side effects of aricept- will start 5 mg daily.  Follow up in 1-2 months. Also will ask Angelica Chessman, RN to evaluate pt at home to see if there are any modifications we could make at home.

## 2011-09-18 NOTE — Patient Instructions (Addendum)
We are starting Aricept 5 mg daily today. Please follow up with me in 1-3 months.

## 2011-10-26 ENCOUNTER — Telehealth: Payer: Self-pay

## 2011-10-26 ENCOUNTER — Encounter: Payer: Medicare Other | Admitting: Family Medicine

## 2011-10-26 NOTE — Telephone Encounter (Signed)
Ana Nunez with [facility] left v/m pt has fallen x 2 in last 1 1/2 weeks. Once lost balance and other time ? Throw rug. No apparent injury. Ana Nunez request pt order for PT and OT evaluation with treatment. Request order faxed to [phone].Please advise.

## 2011-10-27 NOTE — Telephone Encounter (Signed)
Order faxed back to [place] [address] at [facility].

## 2011-10-27 NOTE — Telephone Encounter (Signed)
Noted.  Discussed with daughter, [name].  Rx in my box.

## 2011-11-02 ENCOUNTER — Ambulatory Visit (INDEPENDENT_AMBULATORY_CARE_PROVIDER_SITE_OTHER)
Admission: RE | Admit: 2011-11-02 | Discharge: 2011-11-02 | Disposition: A | Discharge status: Home / Self Care | Payer: Medicare Other | Source: Ambulatory Visit | Attending: Family Medicine | Admitting: Family Medicine

## 2011-11-02 ENCOUNTER — Ambulatory Visit (HOSPITAL_COMMUNITY)
Admission: RE | Admit: 2011-11-02 | Discharge: 2011-11-02 | Disposition: A | Discharge status: Home / Self Care | Payer: Medicare Other | Source: Ambulatory Visit | Attending: Family Medicine | Admitting: Family Medicine

## 2011-11-02 ENCOUNTER — Encounter: Payer: Self-pay | Admitting: Family Medicine

## 2011-11-02 ENCOUNTER — Encounter (HOSPITAL_COMMUNITY): Payer: Self-pay

## 2011-11-02 ENCOUNTER — Ambulatory Visit (INDEPENDENT_AMBULATORY_CARE_PROVIDER_SITE_OTHER): Payer: Medicare Other | Admitting: Family Medicine

## 2011-11-02 ENCOUNTER — Other Ambulatory Visit: Payer: Self-pay | Admitting: Family Medicine

## 2011-11-02 ENCOUNTER — Telehealth: Payer: Self-pay

## 2011-11-02 VITALS — BP 140/80 | HR 98 | Temp 97.4°F | Wt 230.0 lb

## 2011-11-02 DIAGNOSIS — I2510 Atherosclerotic heart disease of native coronary artery without angina pectoris: Secondary | ICD-10-CM | POA: Insufficient documentation

## 2011-11-02 DIAGNOSIS — J449 Chronic obstructive pulmonary disease, unspecified: Secondary | ICD-10-CM | POA: Insufficient documentation

## 2011-11-02 DIAGNOSIS — R7989 Other specified abnormal findings of blood chemistry: Secondary | ICD-10-CM

## 2011-11-02 DIAGNOSIS — R0602 Shortness of breath: Secondary | ICD-10-CM

## 2011-11-02 DIAGNOSIS — J4489 Other specified chronic obstructive pulmonary disease: Secondary | ICD-10-CM | POA: Insufficient documentation

## 2011-11-02 DIAGNOSIS — R079 Chest pain, unspecified: Secondary | ICD-10-CM

## 2011-11-02 DIAGNOSIS — R791 Abnormal coagulation profile: Secondary | ICD-10-CM | POA: Insufficient documentation

## 2011-11-02 LAB — CBC WITH DIFFERENTIAL/PLATELET
Eosinophils Relative: 7.4 % — ABNORMAL HIGH (ref 0.0–5.0)
HCT: 41.2 % (ref 36.0–46.0)
Hemoglobin: 13.5 g/dL (ref 12.0–15.0)
Lymphs Abs: 2 10*3/uL (ref 0.7–4.0)
MCV: 101.4 fl — ABNORMAL HIGH (ref 78.0–100.0)
Monocytes Absolute: 0.8 10*3/uL (ref 0.1–1.0)
Neutro Abs: 5 10*3/uL (ref 1.4–7.7)
Platelets: 184 10*3/uL (ref 150.0–400.0)
WBC: 8.5 10*3/uL (ref 4.5–10.5)

## 2011-11-02 LAB — BASIC METABOLIC PANEL
BUN: 33 mg/dL — ABNORMAL HIGH (ref 6–23)
CO2: 27 mEq/L (ref 19–32)
Calcium: 9.2 mg/dL (ref 8.4–10.5)
Creatinine, Ser: 1.55 mg/dL — ABNORMAL HIGH (ref 0.50–1.10)
Glucose, Bld: 139 mg/dL — ABNORMAL HIGH (ref 70–99)

## 2011-11-02 LAB — D-DIMER, QUANTITATIVE: D-Dimer, Quant: 2.96 ug/mL-FEU — ABNORMAL HIGH (ref 0.00–0.48)

## 2011-11-02 MED ORDER — IOHEXOL 350 MG/ML SOLN
80.0000 mL | Freq: Once | INTRAVENOUS | Status: AC | PRN
  Administered 2011-11-02: 80 mL via INTRAVENOUS

## 2011-11-02 NOTE — Telephone Encounter (Signed)
Delaney Meigs from [facility] called to report critical labs for  Patient. Her D dimer was alerted high 2.96. She stated that it was repeated and verified.

## 2011-11-02 NOTE — Progress Notes (Signed)
Subjective:    Patient ID: Ana Nunez, female    DOB: 1929-06-16, 78 y.o.   MRN: [id number]  HPI  Very pleasant 78 yo female with h/o MS, COPD and recurrent breast CA here with her daughter, [name], for chest pain.  First episode occurred at rest on Saturday (5 days ago)- "crushing," epigastric pain that lasted approximately one hour.  Took tums and it improved. She is on protonix twice daily and pepcid daily for GERD but states she has never had any chest pain or epigastric pain with her GERD.  Pain has occurred a few other times since then but not quite a severe.  Has had some left sided pain with it. No radiation of pain to her jaw or arm.  No worsening SOB but she does have DOE at baseline.  No nausea or vomiting.  Food and liquids have not been getting stuck but she has a h/o dysphagia, likely secondary to her MS.  Has had several recent significant changes- including recurrence of breast CA- most recent scan showed no significant progression. She has had two falls recently, both were not associated with dizziness, more balance issues.  We did order PT evaluation recently - this is to start this week.  Patient Active Problem List  Diagnosis  . ADENOCARCINOMA, BREAST  . COLONIC POLYPS  . HYPOTHYROIDISM  . DIABETES MELLITUS, TYPE II  . DEPRESSION  . SCLEROSIS, MULTIPLE  . HYPERTENSION  . VOCAL CORD DISORDER  . ASTHMA  . COPD  . GERD  . GASTRITIS, CHRONIC  . CONSTIPATION  . [name]  . NECK PAIN  . INVOLUNTARY MOVEMENTS, ABNORMAL  . DYSPHAGIA  . URINARY INCONTINENCE  . POSITIVE PPD  . Routine general medical examination at a health care facility  . Recurrent breast cancer  . Memory loss  . Chest pain   Past Medical History  Diagnosis Date  . [name]   . Fatty liver   . GERD (gastroesophageal reflux disease)   . Type II or unspecified type diabetes mellitus without mention of complication, not stated as uncontrolled   . Positive PPD   . Abnormal involuntary  movements   . Hypercholesteremia   . Hypothyroidism   . Unspecified urinary incontinence   . Vocal cord dysfunction   . Multiple sclerosis   . Hypertension   . Depression   . CAD (coronary artery disease)   . COPD (chronic obstructive pulmonary disease)   . Asthma   . Community acquired pneumonia   . Arthritis   . Adenocarcinoma, breast     Right breast  . Emphysema of lung   . Neuromuscular disorder     MS  . TIA (transient ischemic attack)   . Breast cancer    Past Surgical History  Procedure Date  . Appendectomy 1942  . Tonsillectomy 1951  . Dilation and curettage of uterus 1964  . Nasal sinus surgery 1999  . Breast lumpectomy 1/06    Right  . Fracture surgery may 2010    R ankle    History  Substance Use Topics  . Smoking status: Former Smoker -- 1.5 packs/day for 25 years    Types: Cigarettes    Quit date: 02/07/1984  . Smokeless tobacco: Never Used  . Alcohol Use: Yes     1-2 glaases of wine daily   Family History  Problem Relation Age of Onset  . Coronary artery disease Father   . Heart disease Father   . Pancreatic cancer Mother   .  Emphysema Mother   . Diabetes Mother   . COPD Mother   . Pancreatic cancer Sister   . Cancer Sister     pancreatic  . Lymphoma Sister   . Cancer Sister     pancreatic  . Diabetes Sister   . Thyroid cancer Daughter   . Hypertension Sister   . Hypertension Brother   . Cancer Brother     CML  . Liver cancer Sister   . Cirrhosis Sister   . Cancer Maternal Aunt     breast  . Cancer Brother     bladder   Allergies  Allergen Reactions  . Avelox (Moxifloxacin Hcl In Nacl) Rash    pruritis   Current Outpatient Prescriptions on File Prior to Visit  Medication Sig Dispense Refill  . albuterol (PROVENTIL HFA) 108 (90 BASE) MCG/ACT inhaler Inhale 2 puffs into the lungs every 6 (six) hours as needed.  3 Inhaler  1  . albuterol (PROVENTIL) (2.5 MG/3ML) 0.083% nebulizer solution Take 2.5 mg by nebulization every 4 (four)  hours as needed.        Marland Kitchen aspirin 81 MG tablet Take 81 mg by mouth daily.        . benzonatate (TESSALON) 200 MG capsule Take 1 capsule (200 mg total) by mouth 3 (three) times daily as needed.  100 capsule  1  . Calcium Carbonate-Vitamin D (RA CALCIUM PLUS VITAMIN D) 600-400 MG-UNIT per tablet Take 1 tablet by mouth 2 (two) times daily.        . chlorpheniramine-HYDROcodone (TUSSIONEX PENNKINETIC ER) 10-8 MG/5ML LQCR Take 5 mLs by mouth every 12 (twelve) hours as needed.        . cloNIDine (CATAPRES) 0.3 MG tablet Take 1 tablet (0.3 mg total) by mouth 2 (two) times daily.  180 tablet  3  . cyclobenzaprine (FLEXERIL) 10 MG tablet 2 (two) times daily. Twice daily and as needed      . dextromethorphan-guaiFENesin (MUCINEX DM) 30-600 MG per 12 hr tablet Take 1 tablet by mouth 2 (two) times daily as needed.       . donepezil (ARICEPT) 5 MG tablet Take 1 tablet (5 mg total) by mouth at bedtime.  30 tablet  6  . exemestane (AROMASIN) 25 MG tablet Take 1 tablet by mouth Daily.      . famotidine (PEPCID) 20 MG tablet Take 1 tablet (20 mg total) by mouth at bedtime.  90 tablet  3  . fesoterodine (TOVIAZ) 4 MG TB24 Take 4 mg by mouth daily.        . furosemide (LASIX) 20 MG tablet as needed. Take one tablet by mouth twice daily as needed for edema      . HYDROcodone-acetaminophen (VICODIN) 5-500 MG per tablet Take one tablet by mouth twice daily as needed for arthritis  90 tablet  0  . levothyroxine (SYNTHROID, LEVOTHROID) 137 MCG tablet Take 1 tablet (137 mcg total) by mouth daily.  90 tablet  3  . losartan (COZAAR) 50 MG tablet Take one tablet by mouth daily  90 tablet  3  . magnesium oxide (MAG-OX) 400 MG tablet Take 400 mg by mouth daily.        . metFORMIN (GLUCOPHAGE-XR) 500 MG 24 hr tablet Take 1 tablet (500 mg total) by mouth 2 (two) times daily.  180 tablet  0  . Multiple Vitamin (MULTIVITAMIN) capsule Take 1 capsule by mouth daily.        . Naproxen Sodium (ALEVE) 220 MG CAPS Take  1 capsule by  mouth at bedtime as needed.        . pantoprazole (PROTONIX) 40 MG tablet Take 1 tablet (40 mg total) by mouth 2 (two) times daily.  180 tablet  1  . polyethylene glycol (MIRALAX / GLYCOLAX) packet Take 17 g by mouth as needed.        . sennosides-docusate sodium (SENOKOT-S) 8.6-50 MG tablet Take 1 tablet by mouth daily.      Marland Kitchen tiotropium (SPIRIVA) 18 MCG inhalation capsule Place 1 capsule (18 mcg total) into inhaler and inhale daily.  90 capsule  1  . venlafaxine (EFFEXOR) 75 MG tablet Take 1 tablet (75 mg total) by mouth daily.  90 tablet  3   The PMH, PSH, Social History, Family History, Medications, and allergies have been reviewed in [facility], and have been updated if relevant.  Review of Systems    See HPI  Objective:   Physical Exam BP 140/80  Pulse 98  Temp 97.4 F (36.3 C)  Wt 230 lb (104.327 kg)  SpO2 95% Gen: alert, pleasant, NAD She does have upper airway wheezing with standing, walking and changing positions (baseline). CVS:  RRR Resp:  + upper airway wheezing, lungs otherwise clear Ext:  No edema. Assessment & Plan:   1. Chest pain  EKG 12-Lead, DG Chest 2 View, D-dimer, quantitative, CBC with Differential   New- atypical. EKG reassuring-NSR. Seems most consistent with GERD/Reflux. Given her h/o malignancy, I would like to rule out PE- will check D dimer. Lung exam was reassuring but will check an CXR given her h/o COPD. Discussed plan with pt and her daughter. They both express understanding of these issues and agree with the plan as outlined above.

## 2011-11-02 NOTE — Telephone Encounter (Signed)
Noted.  We were aware and CT angio already ordered.  Thank you.

## 2011-11-02 NOTE — Patient Instructions (Addendum)
Wonderful to see you, Ana Nunez. Your EKG is reassuring that this is not your heart. We are checking some blood work today and also getting a chest xray. We will call you with these results.

## 2011-11-02 NOTE — Addendum Note (Signed)
Addended by: Alvina Chou on: 11/02/2011 10:54 AM   Modules accepted: Orders

## 2011-11-03 ENCOUNTER — Other Ambulatory Visit: Payer: Self-pay | Admitting: Neurology

## 2011-11-03 ENCOUNTER — Other Ambulatory Visit (HOSPITAL_COMMUNITY): Payer: Medicare Other

## 2011-11-03 DIAGNOSIS — C50919 Malignant neoplasm of unspecified site of unspecified female breast: Secondary | ICD-10-CM

## 2011-11-06 ENCOUNTER — Ambulatory Visit (INDEPENDENT_AMBULATORY_CARE_PROVIDER_SITE_OTHER): Payer: Medicare Other | Admitting: Family Medicine

## 2011-11-06 ENCOUNTER — Encounter: Payer: Self-pay | Admitting: Family Medicine

## 2011-11-06 VITALS — BP 124/80 | HR 72 | Temp 97.5°F | Ht 63.0 in | Wt 230.0 lb

## 2011-11-06 DIAGNOSIS — Z23 Encounter for immunization: Secondary | ICD-10-CM

## 2011-11-06 DIAGNOSIS — Z136 Encounter for screening for cardiovascular disorders: Secondary | ICD-10-CM

## 2011-11-06 DIAGNOSIS — F329 Major depressive disorder, single episode, unspecified: Secondary | ICD-10-CM

## 2011-11-06 DIAGNOSIS — N289 Disorder of kidney and ureter, unspecified: Secondary | ICD-10-CM

## 2011-11-06 DIAGNOSIS — G35 Multiple sclerosis: Secondary | ICD-10-CM

## 2011-11-06 DIAGNOSIS — C50919 Malignant neoplasm of unspecified site of unspecified female breast: Secondary | ICD-10-CM

## 2011-11-06 DIAGNOSIS — Z0000 Encounter for general adult medical examination without abnormal findings: Secondary | ICD-10-CM

## 2011-11-06 DIAGNOSIS — E039 Hypothyroidism, unspecified: Secondary | ICD-10-CM

## 2011-11-06 DIAGNOSIS — E119 Type 2 diabetes mellitus without complications: Secondary | ICD-10-CM

## 2011-11-06 DIAGNOSIS — I10 Essential (primary) hypertension: Secondary | ICD-10-CM

## 2011-11-06 MED ORDER — GLIPIZIDE ER 5 MG PO TB24: 5.0000 mg | ORAL_TABLET | Freq: Every day | ORAL | Status: DC

## 2011-11-06 NOTE — Patient Instructions (Addendum)
Good to see you, Ms. Remus. Please STOP taking your Glucophage (Metformin). You are starting Glipidize 1 tablet daily.  We will call you with your lab results.   Please stop by to see Shirlee Limerick on your way out to set up your nephrology appointment.  Please come see me in 3 months.

## 2011-11-06 NOTE — Progress Notes (Signed)
Subjective:    Patient ID: Ana Nunez, female    DOB: 05-Oct-1929, 78 y.o.   MRN: [id number]  HPI  Pleasant 78 yo female with h/o MS, COPD and recurrent breast CA here with daughter, Ana Nunez for [facility].  I have personally reviewed the Medicare Annual Wellness questionnaire and have noted 1. The patient's medical and social history 2. Their use of alcohol, tobacco or illicit drugs 3. Their current medications and supplements 4. The patient's functional ability including ADL's, fall risks, home safety risks and hearing or visual             impairment. 5. Diet and physical activities 6. Evidence for depression or mood disorders   MS- followed by [name]. Since her last appt with him, both Ms. Petko and her daughter, Ana Nunez feel her symptoms have progressed.  She is now calling people by the wrong names and seeing people who have deceased. She is forgetting how to get to the bathroom in her daughter's home who has lived there for years.  Her mother had dementia in her 45s or 70s.  Feels her depression has been stable although has been on Effexor for years.   She does not want to pursue any meds for dementia.  Has had several significant changes- including recurrence of breast CA- most recent scan showed no significant progression.  Renal insufficiency- Cr and GFR worsened this month.  She does stay well hydrated throughout the day. Lab Results  Component Value Date   CREATININE 1.55* 11/02/2011   She is on Glucophage 500 mg twice daily  DM-does not check CBGs regularly. Denies any episodes of hypoglycemia.  Lab Results  Component Value Date   HGBA1C 7.3* 06/29/2010     Patient Active Problem List  Diagnosis  . ADENOCARCINOMA, BREAST  . COLONIC POLYPS  . HYPOTHYROIDISM  . DIABETES MELLITUS, TYPE II  . DEPRESSION  . SCLEROSIS, MULTIPLE  . HYPERTENSION  . VOCAL CORD DISORDER  . ASTHMA  . COPD  . GERD  . GASTRITIS, CHRONIC  . CONSTIPATION  . [name]  . NECK PAIN  .  INVOLUNTARY MOVEMENTS, ABNORMAL  . DYSPHAGIA  . URINARY INCONTINENCE  . POSITIVE PPD  . Routine general medical examination at a health care facility  . Recurrent breast cancer  . Memory loss  . Chest pain   Past Medical History  Diagnosis Date  . [name]   . Fatty liver   . GERD (gastroesophageal reflux disease)   . Type II or unspecified type diabetes mellitus without mention of complication, not stated as uncontrolled   . Positive PPD   . Abnormal involuntary movements   . Hypercholesteremia   . Hypothyroidism   . Unspecified urinary incontinence   . Vocal cord dysfunction   . Multiple sclerosis   . Hypertension   . Depression   . CAD (coronary artery disease)   . COPD (chronic obstructive pulmonary disease)   . Asthma   . Community acquired pneumonia   . Arthritis   . Adenocarcinoma, breast     Right breast  . Emphysema of lung   . Neuromuscular disorder     MS  . TIA (transient ischemic attack)   . Breast cancer    Past Surgical History  Procedure Date  . Appendectomy 1942  . Tonsillectomy 1951  . Dilation and curettage of uterus 1964  . Nasal sinus surgery 1999  . Breast lumpectomy 1/06    Right  . Fracture surgery may 2010  R ankle    History  Substance Use Topics  . Smoking status: Former Smoker -- 1.5 packs/day for 25 years    Types: Cigarettes    Quit date: 02/07/1984  . Smokeless tobacco: Never Used  . Alcohol Use: Yes     1-2 glaases of wine daily   Family History  Problem Relation Age of Onset  . Coronary artery disease Father   . Heart disease Father   . Pancreatic cancer Mother   . Emphysema Mother   . Diabetes Mother   . COPD Mother   . Pancreatic cancer Sister   . Cancer Sister     pancreatic  . Lymphoma Sister   . Cancer Sister     pancreatic  . Diabetes Sister   . Thyroid cancer Daughter   . Hypertension Sister   . Hypertension Brother   . Cancer Brother     CML  . Liver cancer Sister   . Cirrhosis Sister   . Cancer  Maternal Aunt     breast  . Cancer Brother     bladder   Allergies  Allergen Reactions  . Avelox (Moxifloxacin Hcl In Nacl) Rash    pruritis   Current Outpatient Prescriptions on File Prior to Visit  Medication Sig Dispense Refill  . albuterol (PROVENTIL HFA) 108 (90 BASE) MCG/ACT inhaler Inhale 2 puffs into the lungs every 6 (six) hours as needed.  3 Inhaler  1  . albuterol (PROVENTIL) (2.5 MG/3ML) 0.083% nebulizer solution Take 2.5 mg by nebulization every 4 (four) hours as needed.        Marland Kitchen aspirin 81 MG tablet Take 81 mg by mouth daily.        . benzonatate (TESSALON) 200 MG capsule Take 1 capsule (200 mg total) by mouth 3 (three) times daily as needed.  100 capsule  1  . Calcium Carbonate-Vitamin D (RA CALCIUM PLUS VITAMIN D) 600-400 MG-UNIT per tablet Take 1 tablet by mouth 2 (two) times daily.        . chlorpheniramine-HYDROcodone (TUSSIONEX PENNKINETIC ER) 10-8 MG/5ML LQCR Take 5 mLs by mouth every 12 (twelve) hours as needed.        . cloNIDine (CATAPRES) 0.3 MG tablet Take 1 tablet (0.3 mg total) by mouth 2 (two) times daily.  180 tablet  3  . cyclobenzaprine (FLEXERIL) 10 MG tablet 2 (two) times daily. Twice daily and as needed      . dextromethorphan-guaiFENesin (MUCINEX DM) 30-600 MG per 12 hr tablet Take 1 tablet by mouth 2 (two) times daily as needed.       . donepezil (ARICEPT) 5 MG tablet Take 1 tablet (5 mg total) by mouth at bedtime.  30 tablet  6  . exemestane (AROMASIN) 25 MG tablet Take 1 tablet by mouth Daily.      . famotidine (PEPCID) 20 MG tablet Take 1 tablet (20 mg total) by mouth at bedtime.  90 tablet  3  . fesoterodine (TOVIAZ) 4 MG TB24 Take 4 mg by mouth daily.        . furosemide (LASIX) 20 MG tablet as needed. Take one tablet by mouth twice daily as needed for edema      . HYDROcodone-acetaminophen (VICODIN) 5-500 MG per tablet Take one tablet by mouth twice daily as needed for arthritis  90 tablet  0  . levothyroxine (SYNTHROID, LEVOTHROID) 137 MCG tablet  Take 1 tablet (137 mcg total) by mouth daily.  90 tablet  3  . losartan (COZAAR) 50 MG  tablet Take one tablet by mouth daily  90 tablet  3  . magnesium oxide (MAG-OX) 400 MG tablet Take 400 mg by mouth daily.        . metFORMIN (GLUCOPHAGE-XR) 500 MG 24 hr tablet Take 1 tablet (500 mg total) by mouth 2 (two) times daily.  180 tablet  0  . Multiple Vitamin (MULTIVITAMIN) capsule Take 1 capsule by mouth daily.        . Naproxen Sodium (ALEVE) 220 MG CAPS Take 1 capsule by mouth at bedtime as needed.        . pantoprazole (PROTONIX) 40 MG tablet Take 1 tablet (40 mg total) by mouth 2 (two) times daily.  180 tablet  1  . polyethylene glycol (MIRALAX / GLYCOLAX) packet Take 17 g by mouth as needed.        . sennosides-docusate sodium (SENOKOT-S) 8.6-50 MG tablet Take 1 tablet by mouth daily.      Marland Kitchen tiotropium (SPIRIVA) 18 MCG inhalation capsule Place 1 capsule (18 mcg total) into inhaler and inhale daily.  90 capsule  1  . venlafaxine XR (EFFEXOR-XR) 150 MG 24 hr capsule Take 150 mg by mouth daily.       The PMH, PSH, Social History, Family History, Medications, and allergies have been reviewed in [facility], and have been updated if relevant.  Review of Systems    See HPI Objective:   Physical Exam BP 124/80  Pulse 72  Temp 97.5 F (36.4 C)  Ht 5\' 3"  (1.6 m)  Wt 230 lb (104.327 kg)  BMI 40.74 kg/m2 Gen:  Alert, pleasant, missing several front teeth (continues to loose teeth) Resp:  CTA bilaterally, POS upper airway sounds (basline) CVS:  RRR Psych:  Good eye contact, alert and pleasant.    Assessment & Plan:   1. Routine general medical examination at a health care facility  The patients weight, height, BMI and visual acuity have been recorded in the chart I have made referrals, counseling and provided education to the patient based review of the above and I have provided the pt with a written personalized care plan for preventive services.  Comprehensive metabolic panel  2. HYPERTENSION    Stable on current meds.   3. SCLEROSIS, MULTIPLE  Mild to mod symptoms. Stable.   4. DEPRESSION  Stable on effexor.   5. DIABETES MELLITUS, TYPE II  Recheck a1c today. Will d/c glucophage due to worsening kidney failure. Start Glipizide 5 mg daily. Follow up in 3 months. The patient indicates understanding of these issues and agrees with the plan.  Hemoglobin A1c  6. HYPOTHYROIDISM  TSH  7. Recurrent breast cancer  Stable.   8. Renal insufficiency  Deteriorated. See above- d/c metformin. Decrease alleve use. Refer to renal. Ambulatory referral to Nephrology  9.  Screening for ischemic heart disease  Lipid Panel

## 2011-11-07 ENCOUNTER — Other Ambulatory Visit: Payer: Self-pay | Admitting: Family Medicine

## 2011-11-07 DIAGNOSIS — N179 Acute kidney failure, unspecified: Secondary | ICD-10-CM

## 2011-11-07 DIAGNOSIS — E119 Type 2 diabetes mellitus without complications: Secondary | ICD-10-CM

## 2011-11-07 DIAGNOSIS — E039 Hypothyroidism, unspecified: Secondary | ICD-10-CM

## 2011-11-07 LAB — COMPREHENSIVE METABOLIC PANEL
AST: 25 U/L (ref 0–37)
Albumin: 3.7 g/dL (ref 3.5–5.2)
Alkaline Phosphatase: 102 U/L (ref 39–117)
Potassium: 5.1 mEq/L (ref 3.5–5.1)
Sodium: 134 mEq/L — ABNORMAL LOW (ref 135–145)
Total Bilirubin: 0.4 mg/dL (ref 0.3–1.2)
Total Protein: 7 g/dL (ref 6.0–8.3)

## 2011-11-07 LAB — LIPID PANEL
HDL: 43.9 mg/dL (ref >39.00)
Total CHOL/HDL Ratio: 4
Triglycerides: 209 mg/dL — ABNORMAL HIGH (ref 0.0–149.0)
VLDL: 41.8 mg/dL — ABNORMAL HIGH (ref 0.0–40.0)

## 2011-11-07 MED ORDER — LEVOTHYROXINE SODIUM 150 MCG PO TABS: 137.0000 ug | ORAL_TABLET | Freq: Every day | ORAL | Status: DC

## 2011-11-07 NOTE — Addendum Note (Signed)
Addended by: Eliezer Bottom on: 11/07/2011 08:18 AM   Modules accepted: Orders

## 2011-11-09 ENCOUNTER — Ambulatory Visit (INDEPENDENT_AMBULATORY_CARE_PROVIDER_SITE_OTHER): Payer: Medicare Other | Admitting: Critical Care Medicine

## 2011-11-09 ENCOUNTER — Encounter: Payer: Self-pay | Admitting: Critical Care Medicine

## 2011-11-09 ENCOUNTER — Ambulatory Visit: Payer: Medicare Other | Admitting: Critical Care Medicine

## 2011-11-09 VITALS — BP 140/82 | HR 87 | Temp 98.4°F | Ht 64.5 in | Wt 229.0 lb

## 2011-11-09 DIAGNOSIS — J449 Chronic obstructive pulmonary disease, unspecified: Secondary | ICD-10-CM

## 2011-11-09 MED ORDER — BENZONATATE 200 MG PO CAPS: 200.0000 mg | ORAL_CAPSULE | Freq: Three times a day (TID) | ORAL | Status: DC | PRN

## 2011-11-09 NOTE — Patient Instructions (Addendum)
No change in medications. Return in         4 months 

## 2011-11-09 NOTE — Progress Notes (Signed)
Subjective:    Patient ID: Ana Nunez, female    DOB: [birth year], 78 y.o.   MRN: [id number]  HPI  :  78 y.o.  WF with hx of AB/COPD, VCD , and MS   7/13 Pt not on any chemorx at this time.  Last MRI ok.  Cough and breathing is better .   Sees Magrinat again Sept. No real chest pain.  Notes only the upper airway wheeze.   Cough is much better.  No hemoptysis Pt denies any significant sore throat, nasal congestion or excess secretions, fever, chills, sweats, unintended weight loss, pleurtic or exertional chest pain, orthopnea PND, or leg swelling Pt denies any increase in rescue therapy over baseline, denies waking up needing it or having any early am or nocturnal exacerbations of coughing/wheezing/or dyspnea. Pt also denies any obvious fluctuation in symptoms with  weather or environmental change or other alleviating or aggravating factors  11/09/2011 Pt had onset of chest pain and CTA chest neg for PE.  ECG is neg. Pt now back with PCP. Renal function worse.  Chest pain now gone. Pain was anterior, steady pressure, no change with breath.  No n/v.  Lasted a few hours then went away.  No other GI issues.  No sore throat.  Tums helped.  Now dyspnea is at baseline. No mucus now.  CAT 30 Pt denies any significant sore throat, nasal congestion or excess secretions, fever, chills, sweats, unintended weight loss, pleurtic or exertional chest pain, orthopnea PND, or leg swelling Pt denies any increase in rescue therapy over baseline, denies waking up needing it or having any early am or nocturnal exacerbations of coughing/wheezing/or dyspnea. Pt also denies any obvious fluctuation in symptoms with  weather or environmental change or other alleviating or aggravating factors    Past Medical History  Diagnosis Date  . [name]   . Fatty liver   . GERD (gastroesophageal reflux disease)   . Type II or unspecified type diabetes mellitus without mention of complication, not stated as uncontrolled   .  Positive PPD   . Abnormal involuntary movements   . Hypercholesteremia   . Hypothyroidism   . Unspecified urinary incontinence   . Vocal cord dysfunction   . Multiple sclerosis   . Hypertension   . Depression   . CAD (coronary artery disease)   . COPD (chronic obstructive pulmonary disease)   . Asthma   . Community acquired pneumonia   . Arthritis   . Adenocarcinoma, breast     Right breast  . Emphysema of lung   . Neuromuscular disorder     MS  . TIA (transient ischemic attack)   . Breast cancer      Family History  Problem Relation Age of Onset  . Coronary artery disease Father   . Heart disease Father   . Pancreatic cancer Mother   . Emphysema Mother   . Diabetes Mother   . COPD Mother   . Pancreatic cancer Sister   . Cancer Sister     pancreatic  . Lymphoma Sister   . Cancer Sister     pancreatic  . Diabetes Sister   . Thyroid cancer Daughter   . Hypertension Sister   . Hypertension Brother   . Cancer Brother     CML  . Liver cancer Sister   . Cirrhosis Sister   . Cancer Maternal Aunt     breast  . Cancer Brother     bladder     History  Social History  . Marital Status: Married    Spouse Name: N/A    Number of Children: N/A  . Years of Education: N/A   Occupational History  . Housewife     former nurse   Social History Main Topics  . Smoking status: Former Smoker -- 1.5 packs/day for 25 years    Types: Cigarettes    Quit date: 02/07/1984  . Smokeless tobacco: Never Used  . Alcohol Use: Yes     1-2 glaases of wine daily  . Drug Use: No  . Sexually Active: Not on file   Other Topics Concern  . Not on file   Social History Narrative   [name] in [place].Full.Would not desire feeding tube.     Allergies  Allergen Reactions  . Avelox (Moxifloxacin Hcl In Nacl) Rash    pruritis     Outpatient Prescriptions Prior to Visit  Medication Sig Dispense Refill  . albuterol (PROVENTIL HFA) 108 (90 BASE) MCG/ACT inhaler Inhale 2 puffs into  the lungs every 6 (six) hours as needed.  3 Inhaler  1  . albuterol (PROVENTIL) (2.5 MG/3ML) 0.083% nebulizer solution Take 2.5 mg by nebulization every 4 (four) hours as needed.        Marland Kitchen aspirin 81 MG tablet Take 81 mg by mouth daily.        . Calcium Carbonate-Vitamin D (RA CALCIUM PLUS VITAMIN D) 600-400 MG-UNIT per tablet Take 1 tablet by mouth 2 (two) times daily.        . chlorpheniramine-HYDROcodone (TUSSIONEX PENNKINETIC ER) 10-8 MG/5ML LQCR Take 5 mLs by mouth every 12 (twelve) hours as needed.        . cloNIDine (CATAPRES) 0.3 MG tablet Take 1 tablet (0.3 mg total) by mouth 2 (two) times daily.  180 tablet  3  . cyclobenzaprine (FLEXERIL) 10 MG tablet 2 (two) times daily. Twice daily and as needed      . dextromethorphan-guaiFENesin (MUCINEX DM) 30-600 MG per 12 hr tablet Take 1 tablet by mouth 2 (two) times daily as needed.       . donepezil (ARICEPT) 5 MG tablet Take 1 tablet (5 mg total) by mouth at bedtime.  30 tablet  6  . exemestane (AROMASIN) 25 MG tablet Take 1 tablet by mouth Daily.      . famotidine (PEPCID) 20 MG tablet Take 1 tablet (20 mg total) by mouth at bedtime.  90 tablet  3  . fesoterodine (TOVIAZ) 4 MG TB24 Take 4 mg by mouth daily.        . furosemide (LASIX) 20 MG tablet as needed. Take one tablet by mouth twice daily as needed for edema      . glipiZIDE (GLIPIZIDE XL) 5 MG 24 hr tablet Take 1 tablet (5 mg total) by mouth daily.  30 tablet  3  . HYDROcodone-acetaminophen (VICODIN) 5-500 MG per tablet Take one tablet by mouth twice daily as needed for arthritis  90 tablet  0  . losartan (COZAAR) 50 MG tablet Take one tablet by mouth daily  90 tablet  3  . magnesium oxide (MAG-OX) 400 MG tablet Take 400 mg by mouth daily.        . Multiple Vitamin (MULTIVITAMIN) capsule Take 1 capsule by mouth daily.        . Naproxen Sodium (ALEVE) 220 MG CAPS Take 1 capsule by mouth at bedtime as needed.        . pantoprazole (PROTONIX) 40 MG tablet Take 1 tablet (40 mg  total) by  mouth 2 (two) times daily.  180 tablet  1  . polyethylene glycol (MIRALAX / GLYCOLAX) packet Take 17 g by mouth as needed.        . sennosides-docusate sodium (SENOKOT-S) 8.6-50 MG tablet Take 1 tablet by mouth daily.      Marland Kitchen tiotropium (SPIRIVA) 18 MCG inhalation capsule Place 1 capsule (18 mcg total) into inhaler and inhale daily.  90 capsule  1  . venlafaxine XR (EFFEXOR-XR) 150 MG 24 hr capsule Take 150 mg by mouth daily.      . benzonatate (TESSALON) 200 MG capsule Take 1 capsule (200 mg total) by mouth 3 (three) times daily as needed.  100 capsule  1  . levothyroxine (SYNTHROID, LEVOTHROID) 150 MCG tablet Take 1 tablet (150 mcg total) by mouth daily.  30 tablet  3      Review of Systems  Constitutional:   No  weight loss, night sweats,  Fevers, chills,   +fatigue, lassitude. HEENT:   No headaches,  Difficulty swallowing,  Tooth/dental problems,  Sore throat,                No sneezing, itching, ear ache,  +nasal congestion, post nasal drip,   CV:  No chest pain,  Orthopnea, PND, swelling in lower extremities, anasarca, dizziness, palpitations  GI  No heartburn, indigestion, abdominal pain, nausea, vomiting, diarrhea, change in bowel habits, loss of appetite  Resp: Notes  shortness of breath with exertion not    at rest.  Notes some  mucus, notes  productive cough,  Notes  non-productive cough,  No coughing up of blood. No chest wall deformity  Skin: no rash or lesions.  GU: no dysuria, change in color of urine, no urgency or frequency.  No flank pain.  MS:  No joint pain or swelling.  No decreased range of motion.  No back pain.  Psych:  No change in mood or affect. No depression or anxiety.  No memory loss.     Objective:   Physical Exam BP 140/82  Pulse 87  Temp 98.4 F (36.9 C) (Oral)  Ht 5' 4.5" (1.638 m)  Wt 103.874 kg (229 lb)  BMI 38.70 kg/m2  SpO2 98%  Gen: Pleasant, well-nourished, in no distress,  normal affect  ENT: No lesions,  mouth clear,  oropharynx  clear, no postnasal drip  Neck: No JVD, no TMG, no carotid bruits  Lungs: No use of accessory muscles, no dullness to percussion, coarse BS ,  mild pseudo-wheeze  Cardiovascular: RRR, heart sounds normal, no murmur or gallops, no peripheral edema  Abdomen: soft and NT, no HSM,  BS normal  Musculoskeletal: No deformities, no cyanosis or clubbing  Neuro: alert, non focal  Skin: Warm, no lesions or rashes     No results found.    Assessment & Plan:   COPD Gold D Gold D Copd  CAT 30 Plan No change in inhaled or maintenance medications. Return in  4 mo    Updated Medication List Outpatient Encounter Prescriptions as of 11/09/2011  Medication Sig Dispense Refill  . albuterol (PROVENTIL HFA) 108 (90 BASE) MCG/ACT inhaler Inhale 2 puffs into the lungs every 6 (six) hours as needed.  3 Inhaler  1  . albuterol (PROVENTIL) (2.5 MG/3ML) 0.083% nebulizer solution Take 2.5 mg by nebulization every 4 (four) hours as needed.        Marland Kitchen aspirin 81 MG tablet Take 81 mg by mouth daily.        Marland Kitchen  benzonatate (TESSALON) 200 MG capsule Take 1 capsule (200 mg total) by mouth 3 (three) times daily as needed.  100 capsule  6  . Calcium Carbonate-Vitamin D (RA CALCIUM PLUS VITAMIN D) 600-400 MG-UNIT per tablet Take 1 tablet by mouth 2 (two) times daily.        . chlorpheniramine-HYDROcodone (TUSSIONEX PENNKINETIC ER) 10-8 MG/5ML LQCR Take 5 mLs by mouth every 12 (twelve) hours as needed.        . cloNIDine (CATAPRES) 0.3 MG tablet Take 1 tablet (0.3 mg total) by mouth 2 (two) times daily.  180 tablet  3  . cyclobenzaprine (FLEXERIL) 10 MG tablet 2 (two) times daily. Twice daily and as needed      . dextromethorphan-guaiFENesin (MUCINEX DM) 30-600 MG per 12 hr tablet Take 1 tablet by mouth 2 (two) times daily as needed.       . donepezil (ARICEPT) 5 MG tablet Take 1 tablet (5 mg total) by mouth at bedtime.  30 tablet  6  . exemestane (AROMASIN) 25 MG tablet Take 1 tablet by mouth Daily.      .  famotidine (PEPCID) 20 MG tablet Take 1 tablet (20 mg total) by mouth at bedtime.  90 tablet  3  . fesoterodine (TOVIAZ) 4 MG TB24 Take 4 mg by mouth daily.        . furosemide (LASIX) 20 MG tablet as needed. Take one tablet by mouth twice daily as needed for edema      . glipiZIDE (GLIPIZIDE XL) 5 MG 24 hr tablet Take 1 tablet (5 mg total) by mouth daily.  30 tablet  3  . HYDROcodone-acetaminophen (VICODIN) 5-500 MG per tablet Take one tablet by mouth twice daily as needed for arthritis  90 tablet  0  . levothyroxine (SYNTHROID, LEVOTHROID) 150 MCG tablet Take 150 mcg by mouth daily.      Marland Kitchen losartan (COZAAR) 50 MG tablet Take one tablet by mouth daily  90 tablet  3  . magnesium oxide (MAG-OX) 400 MG tablet Take 400 mg by mouth daily.        . Multiple Vitamin (MULTIVITAMIN) capsule Take 1 capsule by mouth daily.        . Naproxen Sodium (ALEVE) 220 MG CAPS Take 1 capsule by mouth at bedtime as needed.        . pantoprazole (PROTONIX) 40 MG tablet Take 1 tablet (40 mg total) by mouth 2 (two) times daily.  180 tablet  1  . polyethylene glycol (MIRALAX / GLYCOLAX) packet Take 17 g by mouth as needed.        . sennosides-docusate sodium (SENOKOT-S) 8.6-50 MG tablet Take 1 tablet by mouth daily.      Marland Kitchen tiotropium (SPIRIVA) 18 MCG inhalation capsule Place 1 capsule (18 mcg total) into inhaler and inhale daily.  90 capsule  1  . venlafaxine XR (EFFEXOR-XR) 150 MG 24 hr capsule Take 150 mg by mouth daily.      Marland Kitchen DISCONTD: benzonatate (TESSALON) 200 MG capsule Take 1 capsule (200 mg total) by mouth 3 (three) times daily as needed.  100 capsule  1  . DISCONTD: levothyroxine (SYNTHROID, LEVOTHROID) 150 MCG tablet Take 1 tablet (150 mcg total) by mouth daily.  30 tablet  3

## 2011-11-10 ENCOUNTER — Other Ambulatory Visit: Payer: Medicare Other

## 2011-11-10 NOTE — Assessment & Plan Note (Signed)
Gold D Copd  CAT 30 Plan No change in inhaled or maintenance medications. Return in  4 mo

## 2011-11-20 ENCOUNTER — Other Ambulatory Visit: Payer: Self-pay

## 2011-11-20 MED ORDER — VENLAFAXINE HCL ER 150 MG PO CP24: 150.0000 mg | ORAL_CAPSULE | Freq: Every day | ORAL | Status: DC

## 2011-11-20 NOTE — Telephone Encounter (Signed)
pts daughter left v/m pt was increased to effexor XR 150 mg and doing well. Kim request # [phone] with refills sent to [facility].Please advise.

## 2011-11-22 ENCOUNTER — Other Ambulatory Visit (HOSPITAL_BASED_OUTPATIENT_CLINIC_OR_DEPARTMENT_OTHER): Payer: Medicare Other | Admitting: Lab

## 2011-11-22 ENCOUNTER — Ambulatory Visit (HOSPITAL_BASED_OUTPATIENT_CLINIC_OR_DEPARTMENT_OTHER): Payer: Medicare Other | Admitting: Physician Assistant

## 2011-11-22 ENCOUNTER — Telehealth: Payer: Self-pay | Admitting: Oncology

## 2011-11-22 ENCOUNTER — Encounter: Payer: Self-pay | Admitting: Physician Assistant

## 2011-11-22 VITALS — BP 140/90 | HR 82 | Temp 98.1°F | Resp 20 | Ht 64.5 in | Wt 232.3 lb

## 2011-11-22 DIAGNOSIS — C50419 Malignant neoplasm of upper-outer quadrant of unspecified female breast: Secondary | ICD-10-CM

## 2011-11-22 DIAGNOSIS — Z853 Personal history of malignant neoplasm of breast: Secondary | ICD-10-CM

## 2011-11-22 DIAGNOSIS — C50919 Malignant neoplasm of unspecified site of unspecified female breast: Secondary | ICD-10-CM

## 2011-11-22 DIAGNOSIS — Z170 Estrogen receptor positive status [ER+]: Secondary | ICD-10-CM

## 2011-11-22 LAB — CBC WITH DIFFERENTIAL/PLATELET
BASO%: 0.3 % (ref 0.0–2.0)
Eosinophils Absolute: 0.6 10*3/uL — ABNORMAL HIGH (ref 0.0–0.5)
LYMPH%: 29.6 % (ref 14.0–49.7)
MCHC: 34 g/dL (ref 31.5–36.0)
MONO#: 0.8 10*3/uL (ref 0.1–0.9)
MONO%: 9.4 % (ref 0.0–14.0)
NEUT#: 4.7 10*3/uL (ref 1.5–6.5)
Platelets: 184 10*3/uL (ref 145–400)
RBC: 3.95 10*6/uL (ref 3.70–5.45)
RDW: 13.1 % (ref 11.2–14.5)
WBC: 8.8 10*3/uL (ref 3.9–10.3)

## 2011-11-22 NOTE — Telephone Encounter (Signed)
gve the pt's dtr the jan 2014 appt calendar. She is aware that they will be called from the [facility] with the mri breast appt from [facility]. Per pt's dtr the pt will have her labs drawn from her primary care physician's office in [place] at [address] [facility]

## 2011-11-22 NOTE — Progress Notes (Signed)
ID: Ana Nunez   DOB: 08-Dec-1929  MR#: [id number]  CSN#:[id number]  HISTORY OF PRESENT ILLNESS Ana Nunez has a prior history of breast cancer as detailed below, and we had released her from our care April 2011. More recently however she had mammography showing a possible of recurrence in the right breast and MRIs suggesting abnormalities in both breasts. Biopsy of the right breast mass on 03/17/2011 showed ([id number]) an invasive ductal breast cancer which is 100% estrogen and 100% progesterone receptor positive, with an elevated proliferation marker at 45%, and importantly with amplification of HER-2, the ratio being 3.10. Biopsy of the left breast was benign.  INTERVAL HISTORY: [name] returns today with her husband and her daughter [name] for followup of her breast cancer. She has been on exemestane now for 6 months, and she is here to assess response to treatment so far. She is tolerating the medication very well. She's having no problems with hot flashes. She does have joint pain, but it does not seem to have increased since beginning the exemestane.  Interval history is remarkable for Ana Nunez having had a CT angiogram on 11/02/2011 to evaluate chest pain and shortness of breath.  Fortunately, there was no evidence of pulmonary embolism and no acute findings. Unfortunately, this did not give [name] a good evaluation of the right breast. Due to the patient's elevated creatinine, however, the family has delayed any additional scans, including her breast MRI.  REVIEW OF SYSTEMS: [name] has had no fevers, chills, or hot flashes. She continues to have multiple dental problems, and now has only 7 teeth remaining. She denies any nausea or emesis and has regular bowel movements. She continues to have significant breathing problems, with quite a bit of wheezing, and shortness of breath with minimal activity.  She's had no abnormal headaches or dizziness. She does have some problems, however, with balance and has fallen 3 times  in the past month. This is associated with her MS and she continues to be followed by [name]. She continues to have some confusion and progressive dementia. She has some occasional anxiety and depression, and they have recently increased her venlafaxine from 75 to 150 mg daily.  Otherwise a detailed review of systems is stable and noncontributory today.  PAST MEDICAL HISTORY: Past Medical History  Diagnosis Date  . [name]   . Fatty liver   . GERD (gastroesophageal reflux disease)   . Type II or unspecified type diabetes mellitus without mention of complication, not stated as uncontrolled   . Positive PPD   . Abnormal involuntary movements   . Hypercholesteremia   . Hypothyroidism   . Unspecified urinary incontinence   . Vocal cord dysfunction   . Multiple sclerosis   . Hypertension   . Depression   . CAD (coronary artery disease)   . COPD (chronic obstructive pulmonary disease)   . Asthma   . Community acquired pneumonia   . Arthritis   . Adenocarcinoma, breast     Right breast  . Emphysema of lung   . Neuromuscular disorder     MS  . TIA (transient ischemic attack)   . Breast cancer     PAST SURGICAL HISTORY: Past Surgical History  Procedure Date  . Appendectomy 1942  . Tonsillectomy 1951  . Dilation and curettage of uterus 1964  . Nasal sinus surgery 1999  . Breast lumpectomy 1/06    Right  . Fracture surgery may 2010    R ankle     FAMILY HISTORY Family  History  Problem Relation Age of Onset  . Coronary artery disease Father   . Heart disease Father   . Pancreatic cancer Mother   . Emphysema Mother   . Diabetes Mother   . COPD Mother   . Pancreatic cancer Sister   . Cancer Sister     pancreatic  . Lymphoma Sister   . Cancer Sister     pancreatic  . Diabetes Sister   . Thyroid cancer Daughter   . Hypertension Sister   . Hypertension Brother   . Cancer Brother     CML  . Liver cancer Sister   . Cirrhosis Sister   . Cancer Maternal Aunt      breast  . Cancer Brother     bladder  The patient's father died at the age of 40 from cardiac disease. The patient's mother died at the age of 60 from complications of diabetes and emphysema.  The patient has two brothers surviving.  She had three sisters; one died from pancreatic cancer, one with complications from transverse myelitis, the other from COPD.  GYNECOLOGIC HISTORY: She is G6, P5, parity age 55.  The patient's last menstrual period was in the 1980s. She never took hormone replacement.    SOCIAL HISTORY: She is a retired Engineer, civil (consulting).  Her husband,  also retired is here with her today as is her daughter, [name] who used to be Archivist.  The other children are [name], who lives in [place] and is a Medical illustrator.  [name], who lives in [place] and works for a theme park.  [name], who lives in [place] and is an Scientist, water quality; and [name], who lives in [place] and works at [employer]. The patient has 13 grandchildren and one great-grandchild.  She is a member of [employer]. 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   ADVANCED DIRECTIVES: in place  HEALTH MAINTENANCE: History  Substance Use Topics  . Smoking status: Former Smoker -- 1.5 packs/day for 25 years    Types: Cigarettes    Quit date: 02/07/1984  . Smokeless tobacco: Never Used  . Alcohol Use: Yes     1-2 glaases of wine daily     Colonoscopy:  PAP:  Bone density:  Lipid panel:  Allergies  Allergen Reactions  . Avelox (Moxifloxacin Hcl In Nacl) Rash    pruritis    Current Outpatient Prescriptions  Medication Sig Dispense Refill  . albuterol (PROVENTIL HFA) 108 (90 BASE) MCG/ACT inhaler Inhale 2 puffs into the lungs every 6 (six) hours as needed.  3 Inhaler  1  . albuterol (PROVENTIL) (2.5 MG/3ML) 0.083% nebulizer solution Take 2.5 mg by nebulization every 4 (four) hours as needed.        Marland Kitchen aspirin 81 MG tablet Take 81 mg by mouth daily.        . benzonatate (TESSALON) 200 MG capsule Take 1 capsule (200 mg  total) by mouth 3 (three) times daily as needed.  100 capsule  6  . Calcium Carbonate-Vitamin D (RA CALCIUM PLUS VITAMIN D) 600-400 MG-UNIT per tablet Take 1 tablet by mouth 2 (two) times daily.        . chlorpheniramine-HYDROcodone (TUSSIONEX PENNKINETIC ER) 10-8 MG/5ML LQCR Take 5 mLs by mouth every 12 (twelve) hours as needed.        . cloNIDine (CATAPRES) 0.3 MG tablet Take 1 tablet (0.3 mg total) by mouth 2 (two) times daily.  180 tablet  3  . cyclobenzaprine (FLEXERIL) 10 MG tablet 2 (two) times daily. Twice daily  and as needed      . dextromethorphan-guaiFENesin (MUCINEX DM) 30-600 MG per 12 hr tablet Take 1 tablet by mouth 2 (two) times daily as needed.       . donepezil (ARICEPT) 5 MG tablet Take 1 tablet (5 mg total) by mouth at bedtime.  30 tablet  6  . exemestane (AROMASIN) 25 MG tablet Take 1 tablet by mouth Daily.      . famotidine (PEPCID) 20 MG tablet Take 1 tablet (20 mg total) by mouth at bedtime.  90 tablet  3  . fesoterodine (TOVIAZ) 4 MG TB24 Take 4 mg by mouth daily.        . furosemide (LASIX) 20 MG tablet as needed. Take one tablet by mouth twice daily as needed for edema      . glipiZIDE (GLIPIZIDE XL) 5 MG 24 hr tablet Take 1 tablet (5 mg total) by mouth daily.  30 tablet  3  . HYDROcodone-acetaminophen (VICODIN) 5-500 MG per tablet Take one tablet by mouth twice daily as needed for arthritis  90 tablet  0  . levothyroxine (SYNTHROID, LEVOTHROID) 150 MCG tablet Take 150 mcg by mouth daily.      Marland Kitchen losartan (COZAAR) 50 MG tablet Take one tablet by mouth daily  90 tablet  3  . magnesium oxide (MAG-OX) 400 MG tablet Take 400 mg by mouth daily.        . Multiple Vitamin (MULTIVITAMIN) capsule Take 1 capsule by mouth daily.        . Naproxen Sodium (ALEVE) 220 MG CAPS Take 1 capsule by mouth at bedtime as needed.        . pantoprazole (PROTONIX) 40 MG tablet Take 1 tablet (40 mg total) by mouth 2 (two) times daily.  180 tablet  1  . polyethylene glycol (MIRALAX / GLYCOLAX)  packet Take 17 g by mouth as needed.        . sennosides-docusate sodium (SENOKOT-S) 8.6-50 MG tablet Take 1 tablet by mouth daily.      Marland Kitchen tiotropium (SPIRIVA) 18 MCG inhalation capsule Place 1 capsule (18 mcg total) into inhaler and inhale daily.  90 capsule  1  . venlafaxine XR (EFFEXOR-XR) 150 MG 24 hr capsule Take 1 capsule (150 mg total) by mouth daily.  90 capsule  3    OBJECTIVE: elderly white woman examined sitting up, unable to climb to the examination table. Filed Vitals:   11/22/11 1402  BP: 140/90  Pulse: 82  Temp: 98.1 F (36.7 C)  Resp: 20     Body mass index is 39.26 kg/(m^2).    ECOG FS: 2  Sclerae unicteric Oropharynx clear No peripheral adenopathy and particularly no evidence of adenopathy in the right or left axillae Lungs no rales or rhonchi, diminished breath sounds by basilar Heart regular rate and rhythm, no murmur appreciated Abd obese, soft, nontender, positive bowel sounds MSK no focal spinal tenderness Neuro: nonfocal, alert and oriented x3 Breasts: Right breast status post remote lumpectomy. I do not palpate a mass; the left breast shows no suspicious findings; both nipples are flat  LAB RESULTS: Lab Results  Component Value Date   WBC 8.8 11/22/2011   NEUTROABS 4.7 11/22/2011   HGB 13.3 11/22/2011   HCT 39.3 11/22/2011   MCV 99.5 11/22/2011   PLT 184 11/22/2011      Chemistry      Component Value Date/Time   NA 134* 11/06/2011 1604   K 5.1 11/06/2011 1604   CL 100 11/06/2011 1604  CO2 27 11/06/2011 1604   BUN 33* 11/06/2011 1604   CREATININE 1.4* 11/06/2011 1604      Component Value Date/Time   CALCIUM 8.9 11/06/2011 1604   ALKPHOS 102 11/06/2011 1604   AST 25 11/06/2011 1604   ALT 32 11/06/2011 1604   BILITOT 0.4 11/06/2011 1604       Lab Results  Component Value Date   LABCA2 28 04/05/2011     STUDIES:  Dg Chest 2 View  11/02/2011  *RADIOLOGY REPORT*  Clinical Data: Chest pain, shortness of breath.  CHEST - 2 VIEW  Comparison:  06/19/2011  Findings: Small nodular density projecting over each upper lung is favored to be external.  Heart size and mediastinal contours within normal range in size. Atherosclerotic calcification of the aorta noted.   Mild right hemidiaphragm eventration.  No confluent airspace opacity, pleural effusion, or pneumothorax.  Multilevel degenerative change with anterior osteophytes.  IMPRESSION: Small nodular density projecting over each upper lung is favored external.  Attention on follow-up.  Otherwise, no radiographic evidence of acute cardiopulmonary process.   Original Report Authenticated By: Waneta Martins, M.D.    Ct Angio Chest W/cm &/or Wo Cm  11/02/2011  *RADIOLOGY REPORT*  Clinical Data: Chest pain and shortness of breath.  Elevated D- dimer.  History of right-sided breast cancer status post lumpectomy.  COPD.  CT ANGIOGRAPHY CHEST  Technique:  Multidetector CT imaging of the chest using the standard protocol during bolus administration of intravenous contrast. Multiplanar reconstructed images including MIPs were obtained and reviewed to evaluate the vascular anatomy.  Contrast: 80mL OMNIPAQUE IOHEXOL 350 MG/ML SOLN  Comparison: Chest CT 04/07/2011.  Findings:  Mediastinum: There are no filling defects within the pulmonary arterial tree to suggest underlying pulmonary embolism. Heart size is normal. There is no significant pericardial fluid, thickening or pericardial calcification. There is atherosclerosis of the thoracic aorta, the great vessels of the mediastinum and the coronary arteries, including calcified atherosclerotic plaque in the left anterior descending and the left circumflex coronary arteries. No pathologically enlarged mediastinal or hilar lymph nodes. The esophagus is unremarkable in appearance.  Lungs/Pleura: There is a background of mild diffuse bronchial wall thickening and mild centrilobular emphysema.  Some peripheral subpleural reticulation is noted in the anterolateral aspect  of the right upper lobe and right middle lobe, likely to represent postradiation changes.  No acute consolidative airspace disease. No pleural effusions.  Pleural based linear opacity in the inferior segment of the lingula likely reflects scarring.  No definite suspicious appearing pulmonary nodules or masses are identified.  Upper Abdomen: Unremarkable.  Musculoskeletal: There are no aggressive appearing lytic or blastic lesions noted in the visualized portions of the skeleton.  Chronic thick-walled low attenuation fluid collection in the lateral aspect of the right breast is similar in appearance to the prior examination measuring approximately 4.0 x 2.6 cm on today's study, likely to represent a postoperative seroma.  IMPRESSION: 1.  No evidence of pulmonary embolism. 2.  No acute findings in the thorax to account for the patient's symptoms. 3.  Mild diffuse bronchial wall thickening with a background of mild centrilobular emphysema; findings compatible with underlying COPD. 4. Atherosclerosis, including two-vessel coronary artery disease.  5.  Additional incidental findings, as above.  There is   Original Report Authenticated By: Florencia Reasons, M.D.    Mr Breast Bilateral W Wo Contrast  07/20/2011  *RADIOLOGY REPORT*  Clinical Data: Assess response to neoadjuvant chemotherapy.  The patient underwent right lumpectomy in 2006, and  was diagnosed with recurrence at the lumpectomy site and March 29, 2011.  BUN and creatinine were obtained on site at [facility] at [address] Results:  BUN 26 mg/dL,  Creatinine 1.4 mg/dL.  BILATERAL BREAST MRI WITH AND WITHOUT CONTRAST  Technique: Multiplanar, multisequence MR images of both breasts were obtained prior to and following the intravenous administration of 10ml of Multihance.  Three dimensional images were evaluated at the independent DynaCad workstation.  Comparison:  03/24/2011  Findings: Mild background parenchymal enhancement.  The  subareolar enhancement on the left that was biopsied after the prior MRI no longer persists as a defined and clumped linear process.  There is still some asymmetric non mass-like enhancement in this area associated with the biopsy marker clip, and review of prior MRI guided biopsy of this area reveals benign results.  On the left, there is lumpectomy change in the posterior lateral right breast.  This mass previously measured 8 x 8 x 6 mm and now measures 8 x 7 x 6 mm.  It shows interval decrease in enhancement with essentially uniform slow enhancement with persistent kinetics. There is no other significant interval change.  IMPRESSION: Minimal interval decrease in size plus decrease in the degree of enhancement of the known right breast carcinoma.  RECOMMENDATION:  BI-RADS CATEGORY 6:  Known biopsy-proven malignancy - appropriate action should be taken.  THREE-DIMENSIONAL MR IMAGE RENDERING ON INDEPENDENT WORKSTATION:  Three-dimensional MR images were rendered by post-processing of the original MR data on an independent workstation.  The three- dimensional MR images were interpreted, and findings were reported in the accompanying complete MRI report for this study.  Original Report Authenticated By: [email or web address]     ASSESSMENT: 78 year old [place] woman,  (1) status post right lumpectomy and sentinel lymph node biopsy January of 2006 for an ER and PR positive, HER-2 negative invasive ductal carcinoma measuring 1.4 cm, grade 2, with a micrometastatic deposit in 1 out of 3 lymph nodes, status post radiation, on anastrozole between April of 2006 and April 2011  (2) now with a triple positive right-sided breast cancer, clinically T1b N0, with an Mib-1 of 45%  (3) started exemestane March 2013  PLAN: Arleth is tolerating the exemestane quite well, and she will continue as before.  Due to her kidney function and the  contrast required for her chest CT recent, the family decided not to pursue a repeat breast MRI at  this time. They would like to repeat a breast MRI in January rather than waiting until April. I have cheduled this accordingly, and the patient will be seeing Dr. Darnelle Catalan soon thereafter to review the results and discuss her treatment plan. Again the overall plan is to avoid surgery, and use anti-estrogens as long as possible given her multiple other comorbid conditions. All this was discussed with the patient and her family today and they are very much in agreement with this plan.  Note that the patient has a living will and DO NOT RESUSCITATE in place and in case of a terminal event she would not want to be resuscitated.  Zainab Crumrine    11/22/2011

## 2011-12-01 ENCOUNTER — Other Ambulatory Visit: Payer: Self-pay | Admitting: *Deleted

## 2011-12-01 MED ORDER — LOSARTAN POTASSIUM 50 MG PO TABS: ORAL_TABLET | ORAL | Status: DC

## 2011-12-05 ENCOUNTER — Other Ambulatory Visit: Payer: Self-pay | Admitting: *Deleted

## 2011-12-05 MED ORDER — PANTOPRAZOLE SODIUM 40 MG PO TBEC: 40.0000 mg | DELAYED_RELEASE_TABLET | Freq: Two times a day (BID) | ORAL | Status: DC

## 2011-12-07 ENCOUNTER — Other Ambulatory Visit: Payer: Self-pay | Admitting: *Deleted

## 2011-12-07 MED ORDER — FAMOTIDINE 20 MG PO TABS: 20.0000 mg | ORAL_TABLET | Freq: Every day | ORAL | Status: DC

## 2011-12-14 ENCOUNTER — Other Ambulatory Visit (INDEPENDENT_AMBULATORY_CARE_PROVIDER_SITE_OTHER): Payer: Medicare Other

## 2011-12-14 DIAGNOSIS — E039 Hypothyroidism, unspecified: Secondary | ICD-10-CM

## 2011-12-14 DIAGNOSIS — N189 Chronic kidney disease, unspecified: Secondary | ICD-10-CM

## 2011-12-14 DIAGNOSIS — N179 Acute kidney failure, unspecified: Secondary | ICD-10-CM

## 2011-12-14 DIAGNOSIS — E119 Type 2 diabetes mellitus without complications: Secondary | ICD-10-CM

## 2011-12-14 LAB — RENAL FUNCTION PANEL
Albumin: 3.6 g/dL (ref 3.5–5.2)
Calcium: 9.1 mg/dL (ref 8.4–10.5)
Creatinine, Ser: 1.5 mg/dL — ABNORMAL HIGH (ref 0.4–1.2)
Glucose, Bld: 207 mg/dL — ABNORMAL HIGH (ref 70–99)
Phosphorus: 3.4 mg/dL (ref 2.3–4.6)
Potassium: 4.7 mEq/L (ref 3.5–5.1)

## 2011-12-14 LAB — TSH: TSH: 0.71 u[IU]/mL (ref 0.35–5.50)

## 2011-12-14 LAB — T4, FREE: Free T4: 1.01 ng/dL (ref 0.60–1.60)

## 2011-12-15 ENCOUNTER — Other Ambulatory Visit: Payer: Self-pay | Admitting: *Deleted

## 2011-12-15 ENCOUNTER — Other Ambulatory Visit: Payer: Self-pay | Admitting: Family Medicine

## 2011-12-15 MED ORDER — LEVOTHYROXINE SODIUM 150 MCG PO TABS: 150.0000 ug | ORAL_TABLET | Freq: Every day | ORAL | Status: DC

## 2011-12-15 MED ORDER — EXEMESTANE 25 MG PO TABS: 25.0000 mg | ORAL_TABLET | Freq: Every day | ORAL | Status: DC

## 2011-12-15 MED ORDER — TRAMADOL HCL 50 MG PO TABS: 50.0000 mg | ORAL_TABLET | Freq: Three times a day (TID) | ORAL | Status: DC | PRN

## 2011-12-19 ENCOUNTER — Other Ambulatory Visit: Payer: Self-pay | Admitting: *Deleted

## 2011-12-20 ENCOUNTER — Telehealth: Payer: Self-pay

## 2011-12-20 MED ORDER — TRAMADOL HCL 50 MG PO TABS: 50.0000 mg | ORAL_TABLET | Freq: Three times a day (TID) | ORAL | Status: DC | PRN

## 2011-12-20 NOTE — Telephone Encounter (Signed)
Advised patient's daughter, she will bring pt in for labs on Friday.

## 2011-12-20 NOTE — Telephone Encounter (Signed)
Rx approved

## 2011-12-20 NOTE — Telephone Encounter (Signed)
Faxed refill request from [facility], last filled date not given.

## 2011-12-20 NOTE — Telephone Encounter (Signed)
Ana Nunez pts daughter, pt is to have CT angio of rt eye this afternoon to r/o macular degeneration; they are cutting dose in half for CT scan; Ana Nunez concerned about contrast dye and kidney function; does glipizide need to be held or dosage adjusted and can kidney function be tested due pt pt having CT scan. Ana Nunez request call back.Please advise.

## 2011-12-20 NOTE — Telephone Encounter (Signed)
No dosage adjustment is typically necessary with glipizide for renal issues.  We can definitely recheck a renal function panel.

## 2011-12-22 ENCOUNTER — Other Ambulatory Visit (INDEPENDENT_AMBULATORY_CARE_PROVIDER_SITE_OTHER): Payer: Medicare Other

## 2011-12-22 DIAGNOSIS — R899 Unspecified abnormal finding in specimens from other organs, systems and tissues: Secondary | ICD-10-CM

## 2011-12-22 DIAGNOSIS — R6889 Other general symptoms and signs: Secondary | ICD-10-CM

## 2011-12-22 LAB — RENAL FUNCTION PANEL
Albumin: 3.6 g/dL (ref 3.5–5.2)
Calcium: 9.2 mg/dL (ref 8.4–10.5)
Chloride: 103 mEq/L (ref 96–112)
Glucose, Bld: 176 mg/dL — ABNORMAL HIGH (ref 70–99)
Potassium: 4.8 mEq/L (ref 3.5–5.1)

## 2011-12-25 ENCOUNTER — Other Ambulatory Visit: Payer: Self-pay

## 2011-12-25 ENCOUNTER — Other Ambulatory Visit: Payer: Self-pay | Admitting: *Deleted

## 2011-12-25 MED ORDER — TRAMADOL HCL 50 MG PO TABS: 50.0000 mg | ORAL_TABLET | Freq: Three times a day (TID) | ORAL | Status: DC | PRN

## 2011-12-25 NOTE — Telephone Encounter (Signed)
Script printed and faxed to [facility].

## 2011-12-25 NOTE — Telephone Encounter (Signed)
Faxed refill request.  Last filled #[phone] on 12/25/11,  Please advise.

## 2011-12-25 NOTE — Telephone Encounter (Signed)
[facility] request faxed rx for Tramadol. [facility] received electronic request for refill on Tramadol buct cannot accept electronic request for Tramadol.Please advise.

## 2011-12-26 NOTE — Telephone Encounter (Signed)
Rx faxed by Jacki Cones.

## 2011-12-28 ENCOUNTER — Encounter: Payer: Self-pay | Admitting: Adult Health

## 2011-12-28 ENCOUNTER — Ambulatory Visit (INDEPENDENT_AMBULATORY_CARE_PROVIDER_SITE_OTHER): Payer: Medicare Other | Admitting: Adult Health

## 2011-12-28 VITALS — BP 126/74 | HR 95 | Temp 97.5°F | Ht 64.5 in | Wt 232.4 lb

## 2011-12-28 DIAGNOSIS — J449 Chronic obstructive pulmonary disease, unspecified: Secondary | ICD-10-CM

## 2011-12-28 MED ORDER — AMOXICILLIN-POT CLAVULANATE 875-125 MG PO TABS: 1.0000 | ORAL_TABLET | Freq: Two times a day (BID) | ORAL | Status: AC

## 2011-12-28 MED ORDER — HYDROCOD POLST-CHLORPHEN POLST 10-8 MG/5ML PO LQCR: 5.0000 mL | Freq: Two times a day (BID) | ORAL | Status: DC | PRN

## 2011-12-28 MED ORDER — BENZONATATE 200 MG PO CAPS: 200.0000 mg | ORAL_CAPSULE | Freq: Three times a day (TID) | ORAL | Status: DC | PRN

## 2011-12-28 MED ORDER — PREDNISONE 10 MG PO TABS: ORAL_TABLET | ORAL | Status: DC

## 2011-12-28 MED ORDER — LEVALBUTEROL HCL 0.63 MG/3ML IN NEBU
0.6300 mg | INHALATION_SOLUTION | Freq: Once | RESPIRATORY_TRACT | Status: AC
  Administered 2011-12-28: 0.63 mg via RESPIRATORY_TRACT

## 2011-12-28 NOTE — Addendum Note (Signed)
Addended by: Boone Master E on: 12/28/2011 12:15 PM   Modules accepted: Orders

## 2011-12-28 NOTE — Patient Instructions (Addendum)
Augmentin 875mg  Twice daily  For 7 days  Mucinex DM Twice daily  As needed  Cough/congestion  Prednisone taper over next week.  Please contact office for sooner follow up if symptoms do not improve or worsen or seek emergency care  Follow up as planned  Dr. Delford Field  and As needed

## 2011-12-28 NOTE — Progress Notes (Signed)
Subjective:    Patient ID: Ana Nunez, female    DOB: 1929/05/04, 78 y.o.   MRN: [id number]  HPI  :  78 y.o.  WF with hx of AB/COPD, VCD , and MS   7/13 Pt not on any chemorx at this time.  Last MRI ok.  Cough and breathing is better .   Sees Magrinat again Sept. No real chest pain.  Notes only the upper airway wheeze.   Cough is much better.  No hemoptysis  11/09/2011 Pt had onset of chest pain and CTA chest neg for PE.  ECG is neg. Pt now back with PCP. Renal function worse.  Chest pain now gone. Pain was anterior, steady pressure, no change with breath.  No n/v.  Lasted a few hours then went away.  No other GI issues.  No sore throat.  Tums helped.  Now dyspnea is at baseline. No mucus now.  CAT 30   12/28/2011 Acute OV  Complains of increased SOB, wheezing, tightness in chest, prod cough with yellow/gray mucus, sweats x10days . Accompanied by daughter today .  No fever or hemoptysis . No calf pain or edema.  Currently on Aromasin for recurrent breast cancer . Did not pursue aggressive  Treatment due to her multiple co-morbidities .  No otc used.         Past Medical History  Diagnosis Date  . [name]   . Fatty liver   . GERD (gastroesophageal reflux disease)   . Type II or unspecified type diabetes mellitus without mention of complication, not stated as uncontrolled   . Positive PPD   . Abnormal involuntary movements   . Hypercholesteremia   . Hypothyroidism   . Unspecified urinary incontinence   . Vocal cord dysfunction   . Multiple sclerosis   . Hypertension   . Depression   . CAD (coronary artery disease)   . COPD (chronic obstructive pulmonary disease)   . Asthma   . Community acquired pneumonia   . Arthritis   . Adenocarcinoma, breast     Right breast  . Emphysema of lung   . Neuromuscular disorder     MS  . TIA (transient ischemic attack)   . Breast cancer      Family History  Problem Relation Age of Onset  . Coronary artery disease Father     . Heart disease Father   . Pancreatic cancer Mother   . Emphysema Mother   . Diabetes Mother   . COPD Mother   . Pancreatic cancer Sister   . Cancer Sister     pancreatic  . Lymphoma Sister   . Cancer Sister     pancreatic  . Diabetes Sister   . Thyroid cancer Daughter   . Hypertension Sister   . Hypertension Brother   . Cancer Brother     CML  . Liver cancer Sister   . Cirrhosis Sister   . Cancer Maternal Aunt     breast  . Cancer Brother     bladder     History   Social History  . Marital Status: Married    Spouse Name: N/A    Number of Children: N/A  . Years of Education: N/A   Occupational History  . Housewife     former nurse   Social History Main Topics  . Smoking status: Former Smoker -- 1.5 packs/day for 25 years    Types: Cigarettes    Quit date: 02/07/1984  . Smokeless tobacco: Never Used  .  Alcohol Use: Yes     Comment: 1-2 glaases of wine daily  . Drug Use: No  . Sexually Active: Not on file   Other Topics Concern  . Not on file   Social History Narrative   [name] Card in [place].Full.Would not desire feeding tube.     Allergies  Allergen Reactions  . Avelox (Moxifloxacin Hcl In Nacl) Rash    pruritis     Outpatient Prescriptions Prior to Visit  Medication Sig Dispense Refill  . albuterol (PROVENTIL HFA) 108 (90 BASE) MCG/ACT inhaler Inhale 2 puffs into the lungs every 6 (six) hours as needed.  3 Inhaler  1  . albuterol (PROVENTIL) (2.5 MG/3ML) 0.083% nebulizer solution Take 2.5 mg by nebulization every 4 (four) hours as needed.        Marland Kitchen aspirin 81 MG tablet Take 81 mg by mouth daily.        . benzonatate (TESSALON) 200 MG capsule Take 1 capsule (200 mg total) by mouth 3 (three) times daily as needed.  100 capsule  6  . Calcium Carbonate-Vitamin D (RA CALCIUM PLUS VITAMIN D) 600-400 MG-UNIT per tablet Take 1 tablet by mouth 2 (two) times daily.        . chlorpheniramine-HYDROcodone (TUSSIONEX PENNKINETIC ER) 10-8 MG/5ML LQCR Take 5 mLs by  mouth every 12 (twelve) hours as needed.        . cloNIDine (CATAPRES) 0.3 MG tablet Take 1 tablet (0.3 mg total) by mouth 2 (two) times daily.  180 tablet  3  . cyclobenzaprine (FLEXERIL) 10 MG tablet 2 (two) times daily. Twice daily and as needed      . dextromethorphan-guaiFENesin (MUCINEX DM) 30-600 MG per 12 hr tablet Take 1 tablet by mouth 2 (two) times daily as needed.       . donepezil (ARICEPT) 5 MG tablet Take 1 tablet (5 mg total) by mouth at bedtime.  30 tablet  6  . exemestane (AROMASIN) 25 MG tablet Take 1 tablet (25 mg total) by mouth daily.  30 tablet  12  . famotidine (PEPCID) 20 MG tablet Take 1 tablet (20 mg total) by mouth at bedtime.  90 tablet  3  . fesoterodine (TOVIAZ) 4 MG TB24 Take 4 mg by mouth daily.        . furosemide (LASIX) 20 MG tablet as needed. Take one tablet by mouth twice daily as needed for edema      . glipiZIDE (GLIPIZIDE XL) 5 MG 24 hr tablet Take 1 tablet (5 mg total) by mouth daily.  30 tablet  3  . HYDROcodone-acetaminophen (VICODIN) 5-500 MG per tablet Take one tablet by mouth twice daily as needed for arthritis  90 tablet  0  . levothyroxine (SYNTHROID, LEVOTHROID) 150 MCG tablet Take 1 tablet (150 mcg total) by mouth daily.  90 tablet  3  . losartan (COZAAR) 50 MG tablet Take one tablet by mouth daily  90 tablet  3  . magnesium oxide (MAG-OX) 400 MG tablet Take 400 mg by mouth daily.        . Multiple Vitamin (MULTIVITAMIN) capsule Take 1 capsule by mouth daily.        . pantoprazole (PROTONIX) 40 MG tablet Take 1 tablet (40 mg total) by mouth 2 (two) times daily.  180 tablet  1  . polyethylene glycol (MIRALAX / GLYCOLAX) packet Take 17 g by mouth as needed.        . sennosides-docusate sodium (SENOKOT-S) 8.6-50 MG tablet Take 1 tablet by mouth  daily.      . tiotropium (SPIRIVA) 18 MCG inhalation capsule Place 1 capsule (18 mcg total) into inhaler and inhale daily.  90 capsule  1  . traMADol (ULTRAM) 50 MG tablet Take 1 tablet (50 mg total) by mouth  every 8 (eight) hours as needed for pain.  90 tablet  0  . venlafaxine XR (EFFEXOR-XR) 150 MG 24 hr capsule Take 1 capsule (150 mg total) by mouth daily.  90 capsule  3      Review of Systems  Constitutional:   No  weight loss, night sweats,  Fevers, chills,   +fatigue, lassitude. HEENT:   No headaches,  Difficulty swallowing,  Tooth/dental problems,  Sore throat,                No sneezing, itching, ear ache,  +nasal congestion, post nasal drip,   CV:  No chest pain,  Orthopnea, PND, swelling in lower extremities, anasarca, dizziness, palpitations  GI  No heartburn, indigestion, abdominal pain, nausea, vomiting, diarrhea, change in bowel habits, loss of appetite  Resp: Notes  shortness of breath with exertion not    at rest.  Notes some  mucus, notes  productive cough,  Notes  non-productive cough,  No coughing up of blood. No chest wall deformity  Skin: no rash or lesions.  GU: no dysuria, change in color of urine, no urgency or frequency.  No flank pain.  MS:  No joint pain or swelling.  No decreased range of motion.  No back pain.  Psych:  No change in mood or affect. No depression or anxiety.  No memory loss.     Objective:   Physical Exam BP 126/74  Pulse 95  Temp 97.5 F (36.4 C) (Oral)  Ht 5' 4.5" (1.638 m)  Wt 232 lb 6.4 oz (105.416 kg)  BMI 39.28 kg/m2  SpO2 99%  Gen: Pleasant,   in no distress,  normal affect  ENT: No lesions,  mouth clear,  oropharynx clear, no postnasal drip  Neck: No JVD, no TMG, no carotid bruits  Lungs: No use of accessory muscles, no dullness to percussion, coarse BS , loud upper airway  pseudo-wheeze  Cardiovascular: RRR, heart sounds normal, no murmur or gallops, no peripheral edema  Abdomen: soft and NT, no HSM,  BS normal  Musculoskeletal: No deformities, no cyanosis or clubbing  Neuro: alert, non focal  Skin: Warm, no lesions or rashes     No results found.    Assessment & Plan:   No problem-specific assessment &  plan notes found for this encounter.   Updated Medication List Outpatient Encounter Prescriptions as of 12/28/2011  Medication Sig Dispense Refill  . albuterol (PROVENTIL HFA) 108 (90 BASE) MCG/ACT inhaler Inhale 2 puffs into the lungs every 6 (six) hours as needed.  3 Inhaler  1  . albuterol (PROVENTIL) (2.5 MG/3ML) 0.083% nebulizer solution Take 2.5 mg by nebulization every 4 (four) hours as needed.        Marland Kitchen aspirin 81 MG tablet Take 81 mg by mouth daily.        . benzonatate (TESSALON) 200 MG capsule Take 1 capsule (200 mg total) by mouth 3 (three) times daily as needed.  100 capsule  6  . Calcium Carbonate-Vitamin D (RA CALCIUM PLUS VITAMIN D) 600-400 MG-UNIT per tablet Take 1 tablet by mouth 2 (two) times daily.        . chlorpheniramine-HYDROcodone (TUSSIONEX PENNKINETIC ER) 10-8 MG/5ML LQCR Take 5 mLs by mouth every 12 (  twelve) hours as needed.        . cloNIDine (CATAPRES) 0.3 MG tablet Take 1 tablet (0.3 mg total) by mouth 2 (two) times daily.  180 tablet  3  . cyclobenzaprine (FLEXERIL) 10 MG tablet 2 (two) times daily. Twice daily and as needed      . dextromethorphan-guaiFENesin (MUCINEX DM) 30-600 MG per 12 hr tablet Take 1 tablet by mouth 2 (two) times daily as needed.       . donepezil (ARICEPT) 5 MG tablet Take 1 tablet (5 mg total) by mouth at bedtime.  30 tablet  6  . exemestane (AROMASIN) 25 MG tablet Take 1 tablet (25 mg total) by mouth daily.  30 tablet  12  . famotidine (PEPCID) 20 MG tablet Take 1 tablet (20 mg total) by mouth at bedtime.  90 tablet  3  . fesoterodine (TOVIAZ) 4 MG TB24 Take 4 mg by mouth daily.        . furosemide (LASIX) 20 MG tablet as needed. Take one tablet by mouth twice daily as needed for edema      . glipiZIDE (GLIPIZIDE XL) 5 MG 24 hr tablet Take 1 tablet (5 mg total) by mouth daily.  30 tablet  3  . HYDROcodone-acetaminophen (VICODIN) 5-500 MG per tablet Take one tablet by mouth twice daily as needed for arthritis  90 tablet  0  . levothyroxine  (SYNTHROID, LEVOTHROID) 150 MCG tablet Take 1 tablet (150 mcg total) by mouth daily.  90 tablet  3  . losartan (COZAAR) 50 MG tablet Take one tablet by mouth daily  90 tablet  3  . magnesium oxide (MAG-OX) 400 MG tablet Take 400 mg by mouth daily.        . Multiple Vitamin (MULTIVITAMIN) capsule Take 1 capsule by mouth daily.        . pantoprazole (PROTONIX) 40 MG tablet Take 1 tablet (40 mg total) by mouth 2 (two) times daily.  180 tablet  1  . polyethylene glycol (MIRALAX / GLYCOLAX) packet Take 17 g by mouth as needed.        . sennosides-docusate sodium (SENOKOT-S) 8.6-50 MG tablet Take 1 tablet by mouth daily.      Marland Kitchen tiotropium (SPIRIVA) 18 MCG inhalation capsule Place 1 capsule (18 mcg total) into inhaler and inhale daily.  90 capsule  1  . traMADol (ULTRAM) 50 MG tablet Take 1 tablet (50 mg total) by mouth every 8 (eight) hours as needed for pain.  90 tablet  0  . venlafaxine XR (EFFEXOR-XR) 150 MG 24 hr capsule Take 1 capsule (150 mg total) by mouth daily.  90 capsule  3

## 2011-12-28 NOTE — Assessment & Plan Note (Addendum)
Flare with Bronchitis  xopenex neb in office   Plan  Augmentin 875mg  Twice daily  For 7 days  Mucinex DM Twice daily  As needed  Cough/congestion  Prednisone taper over next week.  Please contact office for sooner follow up if symptoms do not improve or worsen or seek emergency care  Follow up as planned  Dr. Delford Field  and As needed

## 2011-12-28 NOTE — Addendum Note (Signed)
Addended by: Abigail Miyamoto D on: 12/28/2011 01:16 PM   Modules accepted: Orders

## 2012-01-01 ENCOUNTER — Other Ambulatory Visit: Payer: Self-pay | Admitting: *Deleted

## 2012-01-01 MED ORDER — GLIPIZIDE ER 5 MG PO TB24: 5.0000 mg | ORAL_TABLET | Freq: Every day | ORAL | Status: DC

## 2012-01-01 MED ORDER — DONEPEZIL HCL 5 MG PO TABS: 5.0000 mg | ORAL_TABLET | Freq: Every day | ORAL | Status: DC

## 2012-01-01 MED ORDER — LEVOTHYROXINE SODIUM 150 MCG PO TABS: 150.0000 ug | ORAL_TABLET | Freq: Every day | ORAL | Status: DC

## 2012-01-29 ENCOUNTER — Telehealth: Payer: Self-pay | Admitting: *Deleted

## 2012-01-29 ENCOUNTER — Telehealth: Payer: Self-pay | Admitting: Family Medicine

## 2012-01-29 NOTE — Telephone Encounter (Signed)
Patient's daughter called in and changed pateitn appointment to 03-05-2012 at 4:00

## 2012-01-29 NOTE — Telephone Encounter (Signed)
Called daughter, [name].  She is at Dr. Imagene Gurney office now, will call back with update.

## 2012-01-29 NOTE — Telephone Encounter (Signed)
Pt's daughter called with update.  [name] did full work up on patient today and said she didn't have any major head injury.  He did order brain CT which pt is to have done if not  better in 48 hours.  [name] said pt will go for scan on Thursday if she isn't better and [name] will call with update.

## 2012-01-29 NOTE — Telephone Encounter (Signed)
Thank you :)

## 2012-01-29 NOTE — Telephone Encounter (Signed)
Please call to check on pt. 

## 2012-01-29 NOTE — Telephone Encounter (Signed)
Patient Information:  Caller Name: [name]  Phone: [phone]  Patient: Ana Nunez  Gender: Female  DOB: 1929-02-20  Age: 78 Years  PCP: [name] [facility])  Office Follow Up:  Does the office need to follow up with this patient?: Yes  Instructions For The Office: Daughter is going to call Neurologist to see if they can see patient today. She did not want to schedule with a doctor not familiar with parent since Dr. Dayton Martes is not in office. She will call back if Neurologist [name] is not able to get her in.  RN Note:  Pt fell last week and hit her head on pavement. Daughter states she has intermittent dizziness and nausea. She still has swelling at the back of the head. It went down a little last week but is still prominent. Per daughter she had a small  abrasion but it is healing, no bleeding.   Symptoms  Reason For Call & Symptoms: patient fell last week and hit her head  Reviewed Health History In EMR: Yes  Reviewed Medications In EMR: Yes  Reviewed Allergies In EMR: Yes  Reviewed Surgeries / Procedures: Yes  Date of Onset of Symptoms: 01/25/2012  Treatments Tried: ice and tramadol  Treatments Tried Worked: Yes  Guideline(s) Used:  Head Injury  Disposition Per Guideline:   See Today in Office  Reason For Disposition Reached:   Patient is confused or is an unreliable provider of information (e.g., dementia, profound mental retardation, alcohol intoxication)  Advice Given:  Expected Course:  It may take 2 weeks for pain and tenderness of the injured area to go away.  Call Back If:  You become worse.  Patient Refused Recommendation:  Patient Will Follow Up With Office Later  Daughter will contact her Neurologist because Dr. Dayton Martes is not in office today. She would rather someone evaluate her that is familiar with her because of her Dementia and MS.  They would be able to pick up on changes because she has neurological issues already.

## 2012-02-02 ENCOUNTER — Other Ambulatory Visit: Payer: Self-pay | Admitting: Neurology

## 2012-02-02 ENCOUNTER — Ambulatory Visit
Admission: RE | Admit: 2012-02-02 | Discharge: 2012-02-02 | Disposition: A | Discharge status: Home / Self Care | Payer: Medicare Other | Source: Ambulatory Visit | Attending: Neurology | Admitting: Neurology

## 2012-02-02 DIAGNOSIS — S0990XA Unspecified injury of head, initial encounter: Secondary | ICD-10-CM

## 2012-02-06 NOTE — Telephone Encounter (Signed)
Pt's daughter, [name], called yesterday to let you know that patient did have head CT without contrast yesterday.  She is still complaining of headache, nausea, dizziness.

## 2012-02-27 ENCOUNTER — Telehealth: Payer: Self-pay | Admitting: Critical Care Medicine

## 2012-02-27 ENCOUNTER — Ambulatory Visit: Payer: Medicare Other | Admitting: Oncology

## 2012-02-27 NOTE — Telephone Encounter (Signed)
I spoke with Ana Nunez and she stated pt is coughing uo grey phlem and has noticed increased SOB x 1 week. She could not bring pt in until tomorrow or Thursday. I scheduled pt to see RB at 3:15 for acute visit. Will forward to PW as an [place].

## 2012-02-27 NOTE — Telephone Encounter (Signed)
Noted and thanks.

## 2012-02-28 ENCOUNTER — Ambulatory Visit: Payer: Medicare Other | Admitting: Emergency Medicine

## 2012-02-29 ENCOUNTER — Ambulatory Visit (INDEPENDENT_AMBULATORY_CARE_PROVIDER_SITE_OTHER): Payer: Medicare Other | Admitting: Family Medicine

## 2012-02-29 ENCOUNTER — Encounter: Payer: Self-pay | Admitting: Family Medicine

## 2012-02-29 VITALS — BP 140/82 | HR 112 | Temp 97.6°F | Wt 231.0 lb

## 2012-02-29 DIAGNOSIS — J449 Chronic obstructive pulmonary disease, unspecified: Secondary | ICD-10-CM

## 2012-02-29 DIAGNOSIS — J4489 Other specified chronic obstructive pulmonary disease: Secondary | ICD-10-CM

## 2012-02-29 MED ORDER — AMOXICILLIN-POT CLAVULANATE 875-125 MG PO TABS: 1.0000 | ORAL_TABLET | Freq: Two times a day (BID) | ORAL | Status: DC

## 2012-02-29 MED ORDER — PREDNISONE 10 MG PO TABS: ORAL_TABLET | ORAL | Status: DC

## 2012-02-29 NOTE — Patient Instructions (Signed)
Good to see you, Ms Geck. Please say hello to Mr. Sandhu for me. Take Mucinex DM Twice daily As needed Cough/congestion  Take Augmentin and Prednisone as directed. Please call me next week with an update.

## 2012-02-29 NOTE — Progress Notes (Addendum)
Subjective:    Patient ID: Ana Nunez, female    DOB: 10-29-1929, 79 y.o.   MRN: [id number]  HPI  79 yo very pleasant female with h/o COPD followed by Dr. Delford Field, here with 2 weeks of increased SOB, wheezing.    Complains of increased SOB, wheezing, tightness in chest, prod cough with yellow/gray mucus. Accompanied by daughter, [name],  today .   No fever or hemoptysis . No calf pain or edema.   She has not been using her albuterol nebs at home regularly.  Has not been taking Mucinex at home.  Avelox allergic.    Past Medical History  Diagnosis Date  . [name]   . Fatty liver   . GERD (gastroesophageal reflux disease)   . Type II or unspecified type diabetes mellitus without mention of complication, not stated as uncontrolled   . Positive PPD   . Abnormal involuntary movements   . Hypercholesteremia   . Hypothyroidism   . Unspecified urinary incontinence   . Vocal cord dysfunction   . Multiple sclerosis   . Hypertension   . Depression   . CAD (coronary artery disease)   . COPD (chronic obstructive pulmonary disease)   . Asthma   . Community acquired pneumonia   . Arthritis   . Adenocarcinoma, breast     Right breast  . Emphysema of lung   . Neuromuscular disorder     MS  . TIA (transient ischemic attack)   . Breast cancer      Family History  Problem Relation Age of Onset  . Coronary artery disease Father   . Heart disease Father   . Pancreatic cancer Mother   . Emphysema Mother   . Diabetes Mother   . COPD Mother   . Pancreatic cancer Sister   . Cancer Sister     pancreatic  . Lymphoma Sister   . Cancer Sister     pancreatic  . Diabetes Sister   . Thyroid cancer Daughter   . Hypertension Sister   . Hypertension Brother   . Cancer Brother     CML  . Liver cancer Sister   . Cirrhosis Sister   . Cancer Maternal Aunt     breast  . Cancer Brother     bladder     History   Social History  . Marital Status: Married    Spouse Name: N/A    Number of Children: N/A  . Years of Education: N/A   Occupational History  . Housewife     former nurse   Social History Main Topics  . Smoking status: Former Smoker -- 1.5 packs/day for 25 years    Types: Cigarettes    Quit date: 02/07/1984  . Smokeless tobacco: Never Used  . Alcohol Use: Yes     Comment: 1-2 glaases of wine daily  . Drug Use: No  . Sexually Active: Not on file   Other Topics Concern  . Not on file   Social History Narrative   [name] Card in [place].Full.Would not desire feeding tube.     Allergies  Allergen Reactions  . Avelox (Moxifloxacin Hcl In Nacl) Rash    pruritis     Outpatient Prescriptions Prior to Visit  Medication Sig Dispense Refill  . albuterol (PROVENTIL HFA) 108 (90 BASE) MCG/ACT inhaler Inhale 2 puffs into the lungs every 6 (six) hours as needed.  3 Inhaler  1  . albuterol (PROVENTIL) (2.5 MG/3ML) 0.083% nebulizer solution Take 2.5 mg by nebulization  every 4 (four) hours as needed.        Marland Kitchen aspirin 81 MG tablet Take 81 mg by mouth daily.        . benzonatate (TESSALON) 200 MG capsule Take 1 capsule (200 mg total) by mouth 3 (three) times daily as needed.  100 capsule  6  . Calcium Carbonate-Vitamin D (RA CALCIUM PLUS VITAMIN D) 600-400 MG-UNIT per tablet Take 1 tablet by mouth 2 (two) times daily.        . chlorpheniramine-HYDROcodone (TUSSIONEX PENNKINETIC ER) 10-8 MG/5ML LQCR Take 5 mLs by mouth every 12 (twelve) hours as needed.  140 mL  0  . cloNIDine (CATAPRES) 0.3 MG tablet Take 1 tablet (0.3 mg total) by mouth 2 (two) times daily.  180 tablet  3  . cyclobenzaprine (FLEXERIL) 10 MG tablet 2 (two) times daily. Twice daily and as needed      . dextromethorphan-guaiFENesin (MUCINEX DM) 30-600 MG per 12 hr tablet Take 1 tablet by mouth 2 (two) times daily as needed.       . donepezil (ARICEPT) 5 MG tablet Take 1 tablet (5 mg total) by mouth at bedtime.  90 tablet  1  . exemestane (AROMASIN) 25 MG tablet Take 1 tablet (25 mg total) by mouth  daily.  30 tablet  12  . famotidine (PEPCID) 20 MG tablet Take 1 tablet (20 mg total) by mouth at bedtime.  90 tablet  3  . fesoterodine (TOVIAZ) 4 MG TB24 Take 4 mg by mouth daily.        . furosemide (LASIX) 20 MG tablet as needed. Take one tablet by mouth twice daily as needed for edema      . glipiZIDE (GLIPIZIDE XL) 5 MG 24 hr tablet Take 1 tablet (5 mg total) by mouth daily.  90 tablet  1  . HYDROcodone-acetaminophen (VICODIN) 5-500 MG per tablet Take one tablet by mouth twice daily as needed for arthritis  90 tablet  0  . levothyroxine (SYNTHROID, LEVOTHROID) 150 MCG tablet Take 1 tablet (150 mcg total) by mouth daily.  90 tablet  1  . losartan (COZAAR) 50 MG tablet Take one tablet by mouth daily  90 tablet  3  . magnesium oxide (MAG-OX) 400 MG tablet Take 400 mg by mouth daily.        . Multiple Vitamin (MULTIVITAMIN) capsule Take 1 capsule by mouth daily.        . pantoprazole (PROTONIX) 40 MG tablet Take 1 tablet (40 mg total) by mouth 2 (two) times daily.  180 tablet  1  . polyethylene glycol (MIRALAX / GLYCOLAX) packet Take 17 g by mouth as needed.        . predniSONE (DELTASONE) 10 MG tablet 4 tabs for 2 days, then 3 tabs for 2 days, 2 tabs for 2 days, then 1 tab for 2 days, then stop  20 tablet  0  . sennosides-docusate sodium (SENOKOT-S) 8.6-50 MG tablet Take 1 tablet by mouth daily.      Marland Kitchen tiotropium (SPIRIVA) 18 MCG inhalation capsule Place 1 capsule (18 mcg total) into inhaler and inhale daily.  90 capsule  1  . traMADol (ULTRAM) 50 MG tablet Take 1 tablet (50 mg total) by mouth every 8 (eight) hours as needed for pain.  90 tablet  0  . venlafaxine XR (EFFEXOR-XR) 150 MG 24 hr capsule Take 1 capsule (150 mg total) by mouth daily.  90 capsule  3  Last reviewed on 02/29/2012  8:26 AM by  Eliezer Bottom, CMA    Review of Systems See HPI No CP +DOE      Objective:   Physical Exam BP 140/82  Pulse 112  Temp 97.6 F (36.4 C)  Wt 231 lb (104.781 kg)  SpO2 94%  Gen:  Pleasant,   in no distress,  normal affect  ENT: No lesions,  mouth clear,  oropharynx clear, no postnasal drip  Neck: No JVD, no TMG, no carotid bruits  Lungs: No use of accessory muscles, no dullness to percussion, coarse BS with wheezes throughout lung, loud upper airway  pseudo-wheeze  Cardiovascular: RRR, heart sounds normal, no murmur or gallops, no peripheral edema  Abdomen: soft and NT, no HSM,  BS normal  Musculoskeletal: No deformities, no cyanosis or clubbing  Neuro: alert, non focal  Skin: Warm, no lesions or rashes       Assessment & Plan:   1. COPD Gold D    With acute exacerbation. Duonebs given in office.   Will place on Augmentin 875 mg twice daily x 10 days.   Mucinex DM Twice daily As needed Cough/congestion  Prednisone as directed.  Call me with an update next week.   If no improvement, advised follow up with pulmonary.

## 2012-03-01 MED ORDER — ALBUTEROL SULFATE (5 MG/ML) 0.5% IN NEBU
2.5000 mg | INHALATION_SOLUTION | Freq: Once | RESPIRATORY_TRACT | Status: AC
  Administered 2012-02-29: 2.5 mg via RESPIRATORY_TRACT

## 2012-03-01 MED ORDER — IPRATROPIUM BROMIDE 0.02 % IN SOLN
0.5000 mg | Freq: Once | RESPIRATORY_TRACT | Status: AC
  Administered 2012-02-29: 0.5 mg via RESPIRATORY_TRACT

## 2012-03-01 NOTE — Addendum Note (Signed)
Addended by: Eliezer Bottom on: 03/01/2012 04:28 PM   Modules accepted: Orders

## 2012-03-05 ENCOUNTER — Ambulatory Visit: Payer: Medicare Other | Admitting: Oncology

## 2012-03-05 ENCOUNTER — Other Ambulatory Visit: Payer: Self-pay | Admitting: *Deleted

## 2012-03-05 MED ORDER — TRAMADOL HCL 50 MG PO TABS: 50.0000 mg | ORAL_TABLET | Freq: Three times a day (TID) | ORAL | Status: DC | PRN

## 2012-03-05 MED ORDER — DONEPEZIL HCL 5 MG PO TABS: 5.0000 mg | ORAL_TABLET | Freq: Every day | ORAL | Status: DC

## 2012-03-05 MED ORDER — GLIPIZIDE ER 5 MG PO TB24: 5.0000 mg | ORAL_TABLET | Freq: Every day | ORAL | Status: DC

## 2012-03-05 NOTE — Telephone Encounter (Signed)
Pt is requesting a 90 day supply of tramadol to be [facility]

## 2012-03-06 ENCOUNTER — Telehealth: Payer: Self-pay | Admitting: Oncology

## 2012-03-06 NOTE — Telephone Encounter (Signed)
Patient has been seen in office since this note.

## 2012-03-06 NOTE — Telephone Encounter (Signed)
S/w the pt's dtr and she is aware of the r/s appt from jan to feb.

## 2012-03-12 ENCOUNTER — Ambulatory Visit: Payer: Medicare Other | Admitting: Oncology

## 2012-03-18 ENCOUNTER — Telehealth: Payer: Self-pay | Admitting: Oncology

## 2012-03-18 NOTE — Telephone Encounter (Signed)
pt called to r/s appt....Done °

## 2012-03-20 ENCOUNTER — Ambulatory Visit: Payer: Medicare Other | Admitting: Oncology

## 2012-04-16 ENCOUNTER — Other Ambulatory Visit: Payer: Self-pay | Admitting: Physician Assistant

## 2012-04-18 ENCOUNTER — Other Ambulatory Visit: Payer: Self-pay | Admitting: *Deleted

## 2012-04-18 MED ORDER — VENLAFAXINE HCL ER 150 MG PO CP24: 150.0000 mg | ORAL_CAPSULE | Freq: Every day | ORAL | Status: AC

## 2012-04-18 MED ORDER — GLIPIZIDE ER 5 MG PO TB24: 5.0000 mg | ORAL_TABLET | Freq: Every day | ORAL | Status: AC

## 2012-04-18 MED ORDER — FESOTERODINE FUMARATE ER 8 MG PO TB24: 8.0000 mg | ORAL_TABLET | Freq: Every day | ORAL | Status: AC

## 2012-04-18 MED ORDER — DONEPEZIL HCL 5 MG PO TABS: 5.0000 mg | ORAL_TABLET | Freq: Every day | ORAL | Status: DC

## 2012-04-18 MED ORDER — CLONIDINE HCL 0.3 MG PO TABS: 0.3000 mg | ORAL_TABLET | Freq: Two times a day (BID) | ORAL | Status: AC

## 2012-04-18 MED ORDER — FAMOTIDINE 20 MG PO TABS: 20.0000 mg | ORAL_TABLET | Freq: Every day | ORAL | Status: DC

## 2012-04-18 NOTE — Telephone Encounter (Signed)
Ok to fill 

## 2012-04-21 ENCOUNTER — Telehealth: Payer: Self-pay | Admitting: Oncology

## 2012-04-21 NOTE — Telephone Encounter (Signed)
[facility] will contact pt re breast mri. No other orders.

## 2012-04-22 ENCOUNTER — Telehealth: Payer: Self-pay | Admitting: Oncology

## 2012-04-22 NOTE — Telephone Encounter (Signed)
lmonvm of Ana Nunez [facility] regarding this pt needing her breast mri to be scheduled before 04/30/2012.

## 2012-04-25 ENCOUNTER — Ambulatory Visit: Payer: Medicare Other | Admitting: Critical Care Medicine

## 2012-04-30 ENCOUNTER — Other Ambulatory Visit: Payer: Self-pay | Admitting: *Deleted

## 2012-04-30 ENCOUNTER — Telehealth: Payer: Self-pay | Admitting: Oncology

## 2012-04-30 ENCOUNTER — Ambulatory Visit (HOSPITAL_BASED_OUTPATIENT_CLINIC_OR_DEPARTMENT_OTHER): Payer: Medicare Other | Admitting: Oncology

## 2012-04-30 VITALS — BP 133/69 | HR 112 | Temp 98.1°F | Resp 20 | Ht 64.0 in | Wt 225.8 lb

## 2012-04-30 DIAGNOSIS — C50919 Malignant neoplasm of unspecified site of unspecified female breast: Secondary | ICD-10-CM

## 2012-04-30 DIAGNOSIS — C50911 Malignant neoplasm of unspecified site of right female breast: Secondary | ICD-10-CM

## 2012-04-30 DIAGNOSIS — C50419 Malignant neoplasm of upper-outer quadrant of unspecified female breast: Secondary | ICD-10-CM

## 2012-04-30 DIAGNOSIS — Z170 Estrogen receptor positive status [ER+]: Secondary | ICD-10-CM

## 2012-04-30 MED ORDER — EXEMESTANE 25 MG PO TABS: 25.0000 mg | ORAL_TABLET | Freq: Every day | ORAL | Status: DC

## 2012-04-30 NOTE — Progress Notes (Signed)
ID: Ana Nunez   DOB: 11/21/1929  MR#: [id number]  CSN#:[id number]  HISTORY OF PRESENT ILLNESS Ana Nunez has a prior history of breast cancer as detailed below, and we Nunez released Ana Nunez from our care April 2011. More recently however Ana Nunez Nunez mammography showing a possible of recurrence in the right breast and MRIs suggesting abnormalities in both breasts. Biopsy of the right breast mass on 03/17/2011 showed ([id number]) an invasive ductal breast cancer which is 100% estrogen and 100% progesterone receptor positive, with an elevated proliferation marker at 45%, and importantly with amplification of Ana Nunez-2, the ratio being 3.10. Biopsy of the left breast was benign.  INTERVAL HISTORY: Ana Nunez today with Ana Nunez husband and Ana Nunez daughter Ana Nunez for followup of Ana Nunez breast cancer. Ana Nunez been scheduled for a repeat breast MRI earlier this year with followup here, but with all the snow and other problems that was never done.  REVIEW OF SYSTEMS: Ana Nunez is tolerating the exemestane with no side effects that Ana Nunez is aware of and particularly no complaints regarding hot flashes or vaginal dryness. According to the family Ana Nunez continues to get more and more forgetful. Ana Nunez describes herself as quite fatigued and spent the whole day sitting down, watching TV mostly. Ana Nunez hurts in Ana Nunez knees legs and shoulders these are more aches and anything else Ana Nunez is very short of breath even at rest and keeps a productive cough at times. The phlegm is in the darkish yellow but not green nonbloody range Ana Nunez has urinary incontinence which can be new since. Ana Nunez bruises easily. Ana Nunez has occasional headaches. Otherwise a detailed review of systems today was stable  PAST MEDICAL HISTORY: Past Medical History  Diagnosis Date  . [name]   . Fatty liver   . GERD (gastroesophageal reflux disease)   . Type II or unspecified type diabetes mellitus without mention of complication, not stated as uncontrolled   . Positive PPD   . Abnormal involuntary  movements   . Hypercholesteremia   . Hypothyroidism   . Unspecified urinary incontinence   . Vocal cord dysfunction   . Multiple sclerosis   . Hypertension   . Depression   . CAD (coronary artery disease)   . COPD (chronic obstructive pulmonary disease)   . Asthma   . Community acquired pneumonia   . Arthritis   . Adenocarcinoma, breast     Right breast  . Emphysema of lung   . Neuromuscular disorder     MS  . TIA (transient ischemic attack)   . Breast cancer     PAST SURGICAL HISTORY: Past Surgical History  Procedure Laterality Date  . Appendectomy  1942  . Tonsillectomy  1951  . Dilation and curettage of uterus  1964  . Nasal sinus surgery  1999  . Breast lumpectomy  1/06    Right  . Fracture surgery  may 2010    R ankle     FAMILY HISTORY Family History  Problem Relation Age of Onset  . Coronary artery disease Father   . Heart disease Father   . Pancreatic cancer Mother   . Emphysema Mother   . Diabetes Mother   . COPD Mother   . Pancreatic cancer Sister   . Cancer Sister     pancreatic  . Lymphoma Sister   . Cancer Sister     pancreatic  . Diabetes Sister   . Thyroid cancer Daughter   . Hypertension Sister   . Hypertension Brother   . Cancer Brother  CML  . Liver cancer Sister   . Cirrhosis Sister   . Cancer Maternal Aunt     breast  . Cancer Brother     bladder  The patient's father died at the age of 64 from cardiac disease. The patient's mother died at the age of 41 from complications of diabetes and emphysema.  The patient has two brothers surviving.  Ana Nunez Nunez three sisters; one died from pancreatic cancer, one with complications from transverse myelitis, the other from COPD.  GYNECOLOGIC HISTORY: Ana Nunez is G6, P5, parity age 84.  The patient's last menstrual period was in the 1980s. Ana Nunez never took hormone replacement.    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  Ana Nunez husband,  also retired is here with Ana Nunez today as is Ana Nunez daughter, Ana Nunez who  used to be Archivist.  The other children are [name], who lives in [place] and is a Medical illustrator.  [name], who lives in [place] and works for a theme park.  [name], who lives in [place] and is an Scientist, water quality; and [name], who lives in [place] and works at [employer]. The patient has 13 grandchildren and one great-grandchild.  Ana Nunez is a member of [employer]. 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   ADVANCED DIRECTIVES: in place  HEALTH MAINTENANCE: History  Substance Use Topics  . Smoking status: Former Smoker -- 1.50 packs/day for 25 years    Types: Cigarettes    Quit date: 02/07/1984  . Smokeless tobacco: Never Used  . Alcohol Use: Yes     Comment: 1-2 glaases of wine daily     Colonoscopy:  PAP:  Bone density:  Lipid panel:  Allergies  Allergen Reactions  . Avelox (Moxifloxacin Hcl In Nacl) Rash    pruritis    Current Outpatient Prescriptions  Medication Sig Dispense Refill  . albuterol (PROVENTIL HFA) 108 (90 BASE) MCG/ACT inhaler Inhale 2 puffs into the lungs every 6 (six) hours as needed.  3 Inhaler  1  . albuterol (PROVENTIL) (2.5 MG/3ML) 0.083% nebulizer solution Take 2.5 mg by nebulization every 4 (four) hours as needed.        Marland Kitchen aspirin 81 MG tablet Take 81 mg by mouth daily.        . benzonatate (TESSALON) 200 MG capsule Take 1 capsule (200 mg total) by mouth 3 (three) times daily as needed.  100 capsule  6  . Calcium Carbonate-Vitamin D (RA CALCIUM PLUS VITAMIN D) 600-400 MG-UNIT per tablet Take 1 tablet by mouth 2 (two) times daily.        . cloNIDine (CATAPRES) 0.3 MG tablet Take 1 tablet (0.3 mg total) by mouth 2 (two) times daily.  180 tablet  3  . cyclobenzaprine (FLEXERIL) 10 MG tablet 2 (two) times daily. Twice daily and as needed      . donepezil (ARICEPT) 5 MG tablet Take 1 tablet (5 mg total) by mouth at bedtime.  90 tablet  3  . exemestane (AROMASIN) 25 MG tablet Take 1 tablet (25 mg total) by mouth daily.  30 tablet  12  . famotidine  (PEPCID) 20 MG tablet Take 1 tablet (20 mg total) by mouth at bedtime.  90 tablet  3  . fesoterodine (TOVIAZ) 8 MG TB24 Take 1 tablet (8 mg total) by mouth daily.  90 tablet  3  . furosemide (LASIX) 20 MG tablet as needed. Take one tablet by mouth twice daily as needed for edema      . glipiZIDE (GLIPIZIDE XL) 5 MG  24 hr tablet Take 1 tablet (5 mg total) by mouth daily.  90 tablet  3  . HYDROcodone-acetaminophen (VICODIN) 5-500 MG per tablet Take one tablet by mouth twice daily as needed for arthritis  90 tablet  0  . levothyroxine (SYNTHROID, LEVOTHROID) 150 MCG tablet Take 1 tablet (150 mcg total) by mouth daily.  90 tablet  1  . losartan (COZAAR) 50 MG tablet Take one tablet by mouth daily  90 tablet  3  . magnesium oxide (MAG-OX) 400 MG tablet Take 400 mg by mouth daily.        . Multiple Vitamin (MULTIVITAMIN) capsule Take 1 capsule by mouth daily.        . pantoprazole (PROTONIX) 40 MG tablet Take 1 tablet (40 mg total) by mouth 2 (two) times daily.  180 tablet  1  . polyethylene glycol (MIRALAX / GLYCOLAX) packet Take 17 g by mouth as needed.        . predniSONE (DELTASONE) 10 MG tablet 4 tabs for 2 days, then 3 tabs for 2 days, 2 tabs for 2 days, then 1 tab for 2 days, then stop  20 tablet  0  . sennosides-docusate sodium (SENOKOT-S) 8.6-50 MG tablet Take 1 tablet by mouth daily.      Marland Kitchen tiotropium (SPIRIVA) 18 MCG inhalation capsule Place 1 capsule (18 mcg total) into inhaler and inhale daily.  90 capsule  1  . traMADol (ULTRAM) 50 MG tablet Take 1 tablet (50 mg total) by mouth every 8 (eight) hours as needed for pain.  90 tablet  3  . venlafaxine XR (EFFEXOR-XR) 150 MG 24 hr capsule Take 1 capsule (150 mg total) by mouth daily.  90 capsule  3   No current facility-administered medications for this visit.    OBJECTIVE: elderly white Nunez who gets on the examination table with difficulty (Ana Nunez required assistance from Ana Nunez daughter and myself) Filed Vitals:   04/30/12 1214  BP: 133/69   Pulse: 112  Temp: 98.1 F (36.7 C)  Resp: 20     Body mass index is 38.74 kg/(m^2).    ECOG FS: 2  Sclerae unicteric Oropharynx clear No peripheral adenopathy and particularly no evidence of adenopathy in the right or left axillae Lungs no rales or rhonchi, diminished breath sounds at both bases  Heart regular rate and rhythm, no murmur appreciated Abd obese, soft, nontender, positive bowel sounds MSK no focal spinal tenderness Neuro: nonfocal, pleasant affect Breasts: Right breast status post remote lumpectomy. I do not palpate a mass; there is a half a centimeter or erythematous oral lesion in the lower inner aspect of the right breast which I do not believe is a metastatic deposit, may be an early fungal rash ;the left breast is unremarkable   LAB RESULTS: Lab Results  Component Value Date   WBC 8.8 11/22/2011   NEUTROABS 4.7 11/22/2011   HGB 13.3 11/22/2011   HCT 39.3 11/22/2011   MCV 99.5 11/22/2011   PLT 184 11/22/2011      Chemistry      Component Value Date/Time   NA 137 12/22/2011 1056   K 4.8 12/22/2011 1056   CL 103 12/22/2011 1056   CO2 30 12/22/2011 1056   BUN 34* 12/22/2011 1056   CREATININE 1.4* 12/22/2011 1056      Component Value Date/Time   CALCIUM 9.2 12/22/2011 1056   ALKPHOS 102 11/06/2011 1604   AST 25 11/06/2011 1604   ALT 32 11/06/2011 1604   BILITOT 0.4 11/06/2011 1604  Lab Results  Component Value Date   LABCA2 28 04/05/2011     STUDIES: No results found.  ASSESSMENT: 79 y.o.  Ana Nunez,  (1) status post right lumpectomy and sentinel lymph node biopsy January of 2006 for an ER and PR positive, Ana Nunez-2 negative invasive ductal carcinoma measuring 1.4 cm, grade 2, with a micrometastatic deposit in 1 out of 3 lymph nodes, status post radiation, on anastrozole between April of 2006 and April 2011  (2) now with a triple positive right-sided breast cancer, clinically T1b N0, with an Mib-1 of 45%  (3) started exemestane March  2013  PLAN: Ana Nunez is tolerating the exemestane well, and it appears to be working, although we do not have specific measurements. Certainly there is no palpable mass in the right breast at present. On the other hand they're paying more than $150 a month for this medication that should be less than $10. I went ahead and wrote him prescriptions for exemestane, letrozole, and anastrozole, and which ever they can fill for the cheapest amount as the one that Ana Nunez should be on, as we have no data to suggest that any one of these 3 and aromatase inhibitors is better than the others.  With so many other comorbid problems I think it would not be a good idea to push for an MRI, particularly given concerns regarding Ana Nunez's kidneys. Instead Ana Nunez'll see me again in 6 months and before that visit Ana Nunez will have bilateral breast mammography and a right ultrasound.  Of course they will call me for any problems that may be related to Ana Nunez breast cancer prior to the next visit. Meanwhile he will continue close followup with Ana Nunez other physicians, particularly Dr. Marolyn Haller    04/30/2012

## 2012-05-01 ENCOUNTER — Ambulatory Visit (INDEPENDENT_AMBULATORY_CARE_PROVIDER_SITE_OTHER): Payer: Medicare Other | Admitting: Family Medicine

## 2012-05-01 ENCOUNTER — Encounter: Payer: Self-pay | Admitting: Family Medicine

## 2012-05-01 VITALS — BP 130/80 | HR 98 | Temp 97.9°F | Wt 226.0 lb

## 2012-05-01 DIAGNOSIS — J449 Chronic obstructive pulmonary disease, unspecified: Secondary | ICD-10-CM

## 2012-05-01 DIAGNOSIS — R413 Other amnesia: Secondary | ICD-10-CM

## 2012-05-01 DIAGNOSIS — N189 Chronic kidney disease, unspecified: Secondary | ICD-10-CM | POA: Insufficient documentation

## 2012-05-01 LAB — RENAL FUNCTION PANEL
Calcium: 9.5 mg/dL (ref 8.4–10.5)
Creatinine, Ser: 1.4 mg/dL — ABNORMAL HIGH (ref 0.4–1.2)
Glucose, Bld: 253 mg/dL — ABNORMAL HIGH (ref 70–99)
Sodium: 136 mEq/L (ref 135–145)

## 2012-05-01 MED ORDER — TRAMADOL HCL 50 MG PO TABS: ORAL_TABLET | ORAL | Status: DC

## 2012-05-01 MED ORDER — DONEPEZIL HCL 10 MG PO TABS: 10.0000 mg | ORAL_TABLET | Freq: Every evening | ORAL | Status: DC | PRN

## 2012-05-01 NOTE — Patient Instructions (Addendum)
Good to see you. Let's add scheduled tylenol to increased dose of Tramadol.  We are increasing your aricept to 10 mg daily.  We will call you with your lab results.

## 2012-05-01 NOTE — Progress Notes (Signed)
Subjective:    Patient ID: Ana Nunez, female    DOB: October 25, 1929, 79 y.o.   MRN: [id number]  HPI  Pleasant 79 yo female with h/o MS, COPD and recurrent breast CA here with daughter, [name] to discuss:  Memory loss- was followed by [name] for MS but he recently retired.  Have not yet established with another neurologist.  Both Ms. Roache and her daughter, [name] feel her symptoms have progressed.  She is now calling people by the wrong names and she is forgetting things like her son lives here and he moved here quite a while ago. She is forgetting how to get to and from the bathroom.  She even asked her husband where "stacy is" and that is her husband's name.  Her mother had dementia in her 68s or 69s.  Feels her depression has been stable although has been on Effexor for years.   On aricept 5 mg daily.   Lab Results  Component Value Date   HGBA1C 7.5* 12/14/2011   Kidney disease- Lab Results  Component Value Date   CREATININE 1.4* 12/22/2011   Referred to renal.  She would like to recheck renal function today before pursuing that.  Saw Dr. Arlice Colt this week for her breast CA- note reviewed.     Patient Active Problem List  Diagnosis  . ADENOCARCINOMA, BREAST  . COLONIC POLYPS  . HYPOTHYROIDISM  . DIABETES MELLITUS, TYPE II  . DEPRESSION  . SCLEROSIS, MULTIPLE  . HYPERTENSION  . VOCAL CORD DISORDER  . ASTHMA  . COPD Gold D  . GERD  . GASTRITIS, CHRONIC  . CONSTIPATION  . [name]  . NECK PAIN  . INVOLUNTARY MOVEMENTS, ABNORMAL  . DYSPHAGIA  . URINARY INCONTINENCE  . POSITIVE PPD  . Routine general medical examination at a health care facility  . Recurrent breast cancer  . Memory loss  . Chest pain   Past Medical History  Diagnosis Date  . [name]   . Fatty liver   . GERD (gastroesophageal reflux disease)   . Type II or unspecified type diabetes mellitus without mention of complication, not stated as uncontrolled   . Positive PPD   . Abnormal involuntary  movements   . Hypercholesteremia   . Hypothyroidism   . Unspecified urinary incontinence   . Vocal cord dysfunction   . Multiple sclerosis   . Hypertension   . Depression   . CAD (coronary artery disease)   . COPD (chronic obstructive pulmonary disease)   . Asthma   . Community acquired pneumonia   . Arthritis   . Adenocarcinoma, breast     Right breast  . Emphysema of lung   . Neuromuscular disorder     MS  . TIA (transient ischemic attack)   . Breast cancer    Past Surgical History  Procedure Laterality Date  . Appendectomy  1942  . Tonsillectomy  1951  . Dilation and curettage of uterus  1964  . Nasal sinus surgery  1999  . Breast lumpectomy  1/06    Right  . Fracture surgery  may 2010    R ankle    History  Substance Use Topics  . Smoking status: Former Smoker -- 1.50 packs/day for 25 years    Types: Cigarettes    Quit date: 02/07/1984  . Smokeless tobacco: Never Used  . Alcohol Use: Yes     Comment: 1-2 glaases of wine daily   Family History  Problem Relation Age of Onset  .  Coronary artery disease Father   . Heart disease Father   . Pancreatic cancer Mother   . Emphysema Mother   . Diabetes Mother   . COPD Mother   . Pancreatic cancer Sister   . Cancer Sister     pancreatic  . Lymphoma Sister   . Cancer Sister     pancreatic  . Diabetes Sister   . Thyroid cancer Daughter   . Hypertension Sister   . Hypertension Brother   . Cancer Brother     CML  . Liver cancer Sister   . Cirrhosis Sister   . Cancer Maternal Aunt     breast  . Cancer Brother     bladder   Allergies  Allergen Reactions  . Avelox (Moxifloxacin Hcl In Nacl) Rash    pruritis   Current Outpatient Prescriptions on File Prior to Visit  Medication Sig Dispense Refill  . albuterol (PROVENTIL HFA) 108 (90 BASE) MCG/ACT inhaler Inhale 2 puffs into the lungs every 6 (six) hours as needed.  3 Inhaler  1  . albuterol (PROVENTIL) (2.5 MG/3ML) 0.083% nebulizer solution Take 2.5 mg by  nebulization every 4 (four) hours as needed.        Marland Kitchen aspirin 81 MG tablet Take 81 mg by mouth daily.        . benzonatate (TESSALON) 200 MG capsule Take 1 capsule (200 mg total) by mouth 3 (three) times daily as needed.  100 capsule  6  . Calcium Carbonate-Vitamin D (RA CALCIUM PLUS VITAMIN D) 600-400 MG-UNIT per tablet Take 1 tablet by mouth 2 (two) times daily.        . cloNIDine (CATAPRES) 0.3 MG tablet Take 1 tablet (0.3 mg total) by mouth 2 (two) times daily.  180 tablet  3  . cyclobenzaprine (FLEXERIL) 10 MG tablet 2 (two) times daily. Twice daily and as needed      . donepezil (ARICEPT) 5 MG tablet Take 1 tablet (5 mg total) by mouth at bedtime.  90 tablet  3  . exemestane (AROMASIN) 25 MG tablet Take 1 tablet (25 mg total) by mouth daily.  30 tablet  12  . famotidine (PEPCID) 20 MG tablet Take 1 tablet (20 mg total) by mouth at bedtime.  90 tablet  3  . fesoterodine (TOVIAZ) 8 MG TB24 Take 1 tablet (8 mg total) by mouth daily.  90 tablet  3  . furosemide (LASIX) 20 MG tablet as needed. Take one tablet by mouth twice daily as needed for edema      . glipiZIDE (GLIPIZIDE XL) 5 MG 24 hr tablet Take 1 tablet (5 mg total) by mouth daily.  90 tablet  3  . HYDROcodone-acetaminophen (VICODIN) 5-500 MG per tablet Take one tablet by mouth twice daily as needed for arthritis  90 tablet  0  . levothyroxine (SYNTHROID, LEVOTHROID) 150 MCG tablet Take 1 tablet (150 mcg total) by mouth daily.  90 tablet  1  . losartan (COZAAR) 50 MG tablet Take one tablet by mouth daily  90 tablet  3  . magnesium oxide (MAG-OX) 400 MG tablet Take 400 mg by mouth daily.        . Multiple Vitamin (MULTIVITAMIN) capsule Take 1 capsule by mouth daily.        . pantoprazole (PROTONIX) 40 MG tablet Take 1 tablet (40 mg total) by mouth 2 (two) times daily.  180 tablet  1  . polyethylene glycol (MIRALAX / GLYCOLAX) packet Take 17 g by mouth as needed.        Marland Kitchen  predniSONE (DELTASONE) 10 MG tablet 4 tabs for 2 days, then 3 tabs  for 2 days, 2 tabs for 2 days, then 1 tab for 2 days, then stop  20 tablet  0  . sennosides-docusate sodium (SENOKOT-S) 8.6-50 MG tablet Take 1 tablet by mouth daily.      Marland Kitchen tiotropium (SPIRIVA) 18 MCG inhalation capsule Place 1 capsule (18 mcg total) into inhaler and inhale daily.  90 capsule  1  . traMADol (ULTRAM) 50 MG tablet Take 1 tablet (50 mg total) by mouth every 8 (eight) hours as needed for pain.  90 tablet  3  . venlafaxine XR (EFFEXOR-XR) 150 MG 24 hr capsule Take 1 capsule (150 mg total) by mouth daily.  90 capsule  3   No current facility-administered medications on file prior to visit.   The PMH, PSH, Social History, Family History, Medications, and allergies have been reviewed in [facility], and have been updated if relevant.  Review of Systems    See HPI Objective:   Physical Exam BP 130/80  Pulse 98  Temp(Src) 97.9 F (36.6 C)  Wt 226 lb (102.513 kg)  BMI 38.77 kg/m2  SpO2 97% Gen:  Alert, pleasant, missing several front teeth (continues to loose teeth) Resp:  POS upper airway sounds (basline), decreased BS bilaterally with scattered rhonchi CVS:  RRR Psych:  Good eye contact, alert and pleasant.    Assessment & Plan:   1. Memory loss Deteriorated, likely alzheimers although likely component of MS as well. Increase aricept to 10 mg daily.  2. Chronic kidney disease Recheck renal function panel. - Renal Function Panel  3. COPD Gold D Lungs sounds worse today.  No increased WOB and O2 sats good. Discussed with [name], she would prefer Dr. Delford Field to see her on Monday before adjusting meds.

## 2012-05-02 ENCOUNTER — Other Ambulatory Visit: Payer: Self-pay | Admitting: *Deleted

## 2012-05-02 MED ORDER — TRAMADOL HCL 50 MG PO TABS: ORAL_TABLET | ORAL | Status: DC

## 2012-05-02 NOTE — Telephone Encounter (Signed)
Ok to send 90 day supply.  Please apologize to [facility] for me.

## 2012-05-02 NOTE — Telephone Encounter (Signed)
Pts daughter is asking that new script for tramadol be sent to [facility].  The quantity needs to be sufficient to cover a 90 day period.  Last script sent in was for # 90, but that isnt enough to last the 90 days the way patient is supposed to be taking this.  Please advise.

## 2012-05-02 NOTE — Telephone Encounter (Signed)
Script sent  

## 2012-05-06 ENCOUNTER — Encounter: Payer: Self-pay | Admitting: Critical Care Medicine

## 2012-05-06 ENCOUNTER — Ambulatory Visit (INDEPENDENT_AMBULATORY_CARE_PROVIDER_SITE_OTHER): Payer: Medicare Other | Admitting: Critical Care Medicine

## 2012-05-06 VITALS — BP 144/84 | HR 93 | Temp 97.7°F | Ht 64.0 in | Wt 227.0 lb

## 2012-05-06 DIAGNOSIS — J209 Acute bronchitis, unspecified: Secondary | ICD-10-CM

## 2012-05-06 DIAGNOSIS — J449 Chronic obstructive pulmonary disease, unspecified: Secondary | ICD-10-CM

## 2012-05-06 MED ORDER — PREDNISONE 10 MG PO TABS: ORAL_TABLET | ORAL | Status: DC

## 2012-05-06 MED ORDER — AZITHROMYCIN 250 MG PO TABS: 250.0000 mg | ORAL_TABLET | Freq: Every day | ORAL | Status: DC

## 2012-05-06 NOTE — Patient Instructions (Addendum)
Take azithromycin 250mg  Take two once then one daily until gone Take prednisone 10mg  Take 4 for two days three for two days two for two days one for two days Ok to take mucinex DM 1-2 twice daily (600mg  tab) as needed for cough No other medication changes Return  As needed

## 2012-05-06 NOTE — Assessment & Plan Note (Signed)
Chronic obstructive lung disease gold stage D. With acute tracheobronchitis and flare Plan Take azithromycin 250mg  Take two once then one daily until gone Take prednisone 10mg  Take 4 for two days three for two days two for two days one for two days Ok to take mucinex DM 1-2 twice daily (600mg  tab) as needed for cough No other medication changes Return  As needed

## 2012-05-06 NOTE — Progress Notes (Signed)
Subjective:    Patient ID: Ana Nunez, female    DOB: [birth year], 79 y.o.   MRN: [id number]  HPI  :  79 y.o.  WF with hx of AB/COPD, VCD , and MS   05/06/2012 Pt coughing more , thick yellow. Not any better from 12/28/11 OV with NP.  No ABX since 11/13.  Mucus is yellow to beige.  No nasal congestion, mucus is clear.  Ho hemoptysis  Notes more dyspnea and tightness. Uses tessalon.  Past Medical History  Diagnosis Date  . [name]   . Fatty liver   . GERD (gastroesophageal reflux disease)   . Type II or unspecified type diabetes mellitus without mention of complication, not stated as uncontrolled   . Positive PPD   . Abnormal involuntary movements   . Hypercholesteremia   . Hypothyroidism   . Unspecified urinary incontinence   . Vocal cord dysfunction   . Multiple sclerosis   . Hypertension   . Depression   . CAD (coronary artery disease)   . COPD (chronic obstructive pulmonary disease)   . Asthma   . Community acquired pneumonia   . Arthritis   . Adenocarcinoma, breast     Right breast  . Emphysema of lung   . Neuromuscular disorder     MS  . TIA (transient ischemic attack)   . Breast cancer      Family History  Problem Relation Age of Onset  . Coronary artery disease Father   . Heart disease Father   . Pancreatic cancer Mother   . Emphysema Mother   . Diabetes Mother   . COPD Mother   . Pancreatic cancer Sister   . Cancer Sister     pancreatic  . Lymphoma Sister   . Cancer Sister     pancreatic  . Diabetes Sister   . Thyroid cancer Daughter   . Hypertension Sister   . Hypertension Brother   . Cancer Brother     CML  . Liver cancer Sister   . Cirrhosis Sister   . Cancer Maternal Aunt     breast  . Cancer Brother     bladder     History   Social History  . Marital Status: Married    Spouse Name: N/A    Number of Children: N/A  . Years of Education: N/A   Occupational History  . Housewife     former nurse   Social History Main Topics  .  Smoking status: Former Smoker -- 1.50 packs/day for 25 years    Types: Cigarettes    Quit date: 02/07/1984  . Smokeless tobacco: Never Used  . Alcohol Use: Yes     Comment: 1-2 glaases of wine daily  . Drug Use: No  . Sexually Active: Not on file   Other Topics Concern  . Not on file   Social History Narrative   [name] Card in [place].   Full.   Would not desire feeding tube.     Allergies  Allergen Reactions  . Avelox (Moxifloxacin Hcl In Nacl) Rash    pruritis     Outpatient Prescriptions Prior to Visit  Medication Sig Dispense Refill  . albuterol (PROVENTIL HFA) 108 (90 BASE) MCG/ACT inhaler Inhale 2 puffs into the lungs every 6 (six) hours as needed.  3 Inhaler  1  . albuterol (PROVENTIL) (2.5 MG/3ML) 0.083% nebulizer solution Take 2.5 mg by nebulization every 4 (four) hours as needed.        Marland Kitchen aspirin  81 MG tablet Take 81 mg by mouth daily.        . benzonatate (TESSALON) 200 MG capsule Take 1 capsule (200 mg total) by mouth 3 (three) times daily as needed.  100 capsule  6  . Calcium Carbonate-Vitamin D (RA CALCIUM PLUS VITAMIN D) 600-400 MG-UNIT per tablet Take 1 tablet by mouth 2 (two) times daily.        . cloNIDine (CATAPRES) 0.3 MG tablet Take 1 tablet (0.3 mg total) by mouth 2 (two) times daily.  180 tablet  3  . cyclobenzaprine (FLEXERIL) 10 MG tablet 2 (two) times daily. Twice daily and as needed      . exemestane (AROMASIN) 25 MG tablet Take 1 tablet (25 mg total) by mouth daily.  30 tablet  12  . famotidine (PEPCID) 20 MG tablet Take 1 tablet (20 mg total) by mouth at bedtime.  90 tablet  3  . fesoterodine (TOVIAZ) 8 MG TB24 Take 1 tablet (8 mg total) by mouth daily.  90 tablet  3  . furosemide (LASIX) 20 MG tablet as needed. Take one tablet by mouth twice daily as needed for edema      . glipiZIDE (GLIPIZIDE XL) 5 MG 24 hr tablet Take 1 tablet (5 mg total) by mouth daily.  90 tablet  3  . HYDROcodone-acetaminophen (VICODIN) 5-500 MG per tablet Take one tablet by mouth  twice daily as needed for arthritis  90 tablet  0  . levothyroxine (SYNTHROID, LEVOTHROID) 150 MCG tablet Take 1 tablet (150 mcg total) by mouth daily.  90 tablet  1  . losartan (COZAAR) 50 MG tablet Take one tablet by mouth daily  90 tablet  3  . magnesium oxide (MAG-OX) 400 MG tablet Take 400 mg by mouth daily.        . Multiple Vitamin (MULTIVITAMIN) capsule Take 1 capsule by mouth daily.        . pantoprazole (PROTONIX) 40 MG tablet Take 1 tablet (40 mg total) by mouth 2 (two) times daily.  180 tablet  1  . polyethylene glycol (MIRALAX / GLYCOLAX) packet Take 17 g by mouth as needed.        . tiotropium (SPIRIVA) 18 MCG inhalation capsule Place 1 capsule (18 mcg total) into inhaler and inhale daily.  90 capsule  1  . traMADol (ULTRAM) 50 MG tablet 100 mg every morning, 50 mg three times daily prn pain  450 tablet  3  . venlafaxine XR (EFFEXOR-XR) 150 MG 24 hr capsule Take 1 capsule (150 mg total) by mouth daily.  90 capsule  3  . donepezil (ARICEPT) 10 MG tablet Take 1 tablet (10 mg total) by mouth at bedtime as needed.  90 tablet  3  . predniSONE (DELTASONE) 10 MG tablet 4 tabs for 2 days, then 3 tabs for 2 days, 2 tabs for 2 days, then 1 tab for 2 days, then stop  20 tablet  0  . sennosides-docusate sodium (SENOKOT-S) 8.6-50 MG tablet Take 1 tablet by mouth daily.       No facility-administered medications prior to visit.      Review of Systems  Constitutional:   No  weight loss, night sweats,  Fevers, chills,   +fatigue, lassitude. HEENT:   No headaches,  Difficulty swallowing,  Tooth/dental problems,  Sore throat,                No sneezing, itching, ear ache,  +nasal congestion, post nasal drip,   CV:  No chest pain,  Orthopnea, PND, swelling in lower extremities, anasarca, dizziness, palpitations  GI  No heartburn, indigestion, abdominal pain, nausea, vomiting, diarrhea, change in bowel habits, loss of appetite  Resp: Notes  shortness of breath with exertion not    at rest.   Notes some  mucus, notes  productive cough,  Notes  non-productive cough,  No coughing up of blood. No chest wall deformity  Skin: no rash or lesions.  GU: no dysuria, change in color of urine, no urgency or frequency.  No flank pain.  MS:  No joint pain or swelling.  No decreased range of motion.  No back pain.  Psych:  No change in mood or affect. No depression or anxiety.  No memory loss.     Objective:   Physical Exam BP 144/84  Pulse 93  Temp(Src) 97.7 F (36.5 C) (Oral)  Ht 5\' 4"  (1.626 m)  Wt 227 lb (102.967 kg)  BMI 38.95 kg/m2  SpO2 95%  Gen: Pleasant,   in no distress,  normal affect  ENT: No lesions,  mouth clear,  oropharynx clear, no postnasal drip  Neck: No JVD, no TMG, no carotid bruits  Lungs: No use of accessory muscles, no dullness to percussion, coarse BS , loud upper airway  pseudo-wheeze  Cardiovascular: RRR, heart sounds normal, no murmur or gallops, no peripheral edema  Abdomen: soft and NT, no HSM,  BS normal  Musculoskeletal: No deformities, no cyanosis or clubbing  Neuro: alert, non focal  Skin: Warm, no lesions or rashes     No results found.    Assessment & Plan:   COPD Gold D Chronic obstructive lung disease gold stage D. With acute tracheobronchitis and flare Plan Take azithromycin 250mg  Take two once then one daily until gone Take prednisone 10mg  Take 4 for two days three for two days two for two days one for two days Ok to take mucinex DM 1-2 twice daily (600mg  tab) as needed for cough No other medication changes Return  As needed     Updated Medication List Outpatient Encounter Prescriptions as of 05/06/2012  Medication Sig Dispense Refill  . albuterol (PROVENTIL HFA) 108 (90 BASE) MCG/ACT inhaler Inhale 2 puffs into the lungs every 6 (six) hours as needed.  3 Inhaler  1  . albuterol (PROVENTIL) (2.5 MG/3ML) 0.083% nebulizer solution Take 2.5 mg by nebulization every 4 (four) hours as needed.        Marland Kitchen aspirin 81 MG tablet  Take 81 mg by mouth daily.        . benzonatate (TESSALON) 200 MG capsule Take 1 capsule (200 mg total) by mouth 3 (three) times daily as needed.  100 capsule  6  . Calcium Carbonate-Vitamin D (RA CALCIUM PLUS VITAMIN D) 600-400 MG-UNIT per tablet Take 1 tablet by mouth 2 (two) times daily.        . cloNIDine (CATAPRES) 0.3 MG tablet Take 1 tablet (0.3 mg total) by mouth 2 (two) times daily.  180 tablet  3  . cyclobenzaprine (FLEXERIL) 10 MG tablet 2 (two) times daily. Twice daily and as needed      . donepezil (ARICEPT) 10 MG tablet Take 10 mg by mouth at bedtime.      Marland Kitchen exemestane (AROMASIN) 25 MG tablet Take 1 tablet (25 mg total) by mouth daily.  30 tablet  12  . famotidine (PEPCID) 20 MG tablet Take 1 tablet (20 mg total) by mouth at bedtime.  90 tablet  3  .  fesoterodine (TOVIAZ) 8 MG TB24 Take 1 tablet (8 mg total) by mouth daily.  90 tablet  3  . furosemide (LASIX) 20 MG tablet as needed. Take one tablet by mouth twice daily as needed for edema      . glipiZIDE (GLIPIZIDE XL) 5 MG 24 hr tablet Take 1 tablet (5 mg total) by mouth daily.  90 tablet  3  . HYDROcodone-acetaminophen (VICODIN) 5-500 MG per tablet Take one tablet by mouth twice daily as needed for arthritis  90 tablet  0  . levothyroxine (SYNTHROID, LEVOTHROID) 150 MCG tablet Take 1 tablet (150 mcg total) by mouth daily.  90 tablet  1  . losartan (COZAAR) 50 MG tablet Take one tablet by mouth daily  90 tablet  3  . magnesium oxide (MAG-OX) 400 MG tablet Take 400 mg by mouth daily.        . Multiple Vitamin (MULTIVITAMIN) capsule Take 1 capsule by mouth daily.        . pantoprazole (PROTONIX) 40 MG tablet Take 1 tablet (40 mg total) by mouth 2 (two) times daily.  180 tablet  1  . polyethylene glycol (MIRALAX / GLYCOLAX) packet Take 17 g by mouth as needed.        . sennosides-docusate sodium (SENOKOT-S) 8.6-50 MG tablet Take 1 tablet by mouth daily.      Marland Kitchen tiotropium (SPIRIVA) 18 MCG inhalation capsule Place 1 capsule (18 mcg  total) into inhaler and inhale daily.  90 capsule  1  . traMADol (ULTRAM) 50 MG tablet 100 mg every morning, 50 mg three times daily prn pain  450 tablet  3  . venlafaxine XR (EFFEXOR-XR) 150 MG 24 hr capsule Take 1 capsule (150 mg total) by mouth daily.  90 capsule  3  . [DISCONTINUED] donepezil (ARICEPT) 10 MG tablet Take 1 tablet (10 mg total) by mouth at bedtime as needed.  90 tablet  3  . azithromycin (ZITHROMAX) 250 MG tablet Take 1 tablet (250 mg total) by mouth daily. Take two once then one daily until gone  6 each  0  . predniSONE (DELTASONE) 10 MG tablet Take 4 for two days three for two days two for two days one for two days  20 tablet  0  . [DISCONTINUED] predniSONE (DELTASONE) 10 MG tablet 4 tabs for 2 days, then 3 tabs for 2 days, 2 tabs for 2 days, then 1 tab for 2 days, then stop  20 tablet  0  . [DISCONTINUED] sennosides-docusate sodium (SENOKOT-S) 8.6-50 MG tablet Take 1 tablet by mouth daily.       No facility-administered encounter medications on file as of 05/06/2012.

## 2012-05-09 ENCOUNTER — Other Ambulatory Visit: Payer: Self-pay | Admitting: Physician Assistant

## 2012-06-07 ENCOUNTER — Ambulatory Visit (INDEPENDENT_AMBULATORY_CARE_PROVIDER_SITE_OTHER): Payer: Medicare Other | Admitting: Family Medicine

## 2012-06-07 ENCOUNTER — Encounter: Payer: Self-pay | Admitting: Family Medicine

## 2012-06-07 VITALS — BP 134/82 | HR 98 | Temp 98.5°F | Wt 216.0 lb

## 2012-06-07 DIAGNOSIS — J449 Chronic obstructive pulmonary disease, unspecified: Secondary | ICD-10-CM

## 2012-06-07 MED ORDER — PREDNISONE 10 MG PO TABS: ORAL_TABLET | ORAL | Status: DC

## 2012-06-07 MED ORDER — AMOXICILLIN-POT CLAVULANATE 875-125 MG PO TABS: 1.0000 | ORAL_TABLET | Freq: Two times a day (BID) | ORAL | Status: DC

## 2012-06-07 NOTE — Progress Notes (Signed)
Subjective:    Patient ID: Ana Nunez, female    DOB: October 08, 1929, 79 y.o.   MRN: [id number]  HPI  79 yo very pleasant female with h/o COPD followed by Dr. Delford Field, here with several days of increased SOB, wheezing.    Complains of increased SOB, wheezing, tightness in chest, prod cough with yellow/gray mucus, and malaise. Accompanied by daughter, [name],  today .   No fever or hemoptysis . No calf pain or edema.   She has not been using her albuterol nebs at home regularly.  Has not been taking Mucinex at home but she is using Tessalon.  Avelox allergic.  Saw Dr. Delford Field in March.  Treated for acute exacerbation with Zpack and oral prednisone.  Past Medical History  Diagnosis Date  . [name]   . Fatty liver   . GERD (gastroesophageal reflux disease)   . Type II or unspecified type diabetes mellitus without mention of complication, not stated as uncontrolled   . Positive PPD   . Abnormal involuntary movements   . Hypercholesteremia   . Hypothyroidism   . Unspecified urinary incontinence   . Vocal cord dysfunction   . Multiple sclerosis   . Hypertension   . Depression   . CAD (coronary artery disease)   . COPD (chronic obstructive pulmonary disease)   . Asthma   . Community acquired pneumonia   . Arthritis   . Adenocarcinoma, breast     Right breast  . Emphysema of lung   . Neuromuscular disorder     MS  . TIA (transient ischemic attack)   . Breast cancer      Family History  Problem Relation Age of Onset  . Coronary artery disease Father   . Heart disease Father   . Pancreatic cancer Mother   . Emphysema Mother   . Diabetes Mother   . COPD Mother   . Pancreatic cancer Sister   . Cancer Sister     pancreatic  . Lymphoma Sister   . Cancer Sister     pancreatic  . Diabetes Sister   . Thyroid cancer Daughter   . Hypertension Sister   . Hypertension Brother   . Cancer Brother     CML  . Liver cancer Sister   . Cirrhosis Sister   . Cancer Maternal  Aunt     breast  . Cancer Brother     bladder     History   Social History  . Marital Status: Married    Spouse Name: N/A    Number of Children: N/A  . Years of Education: N/A   Occupational History  . Housewife     former nurse   Social History Main Topics  . Smoking status: Former Smoker -- 1.50 packs/day for 25 years    Types: Cigarettes    Quit date: 02/07/1984  . Smokeless tobacco: Never Used  . Alcohol Use: Yes     Comment: 1-2 glaases of wine daily  . Drug Use: No  . Sexually Active: Not on file   Other Topics Concern  . Not on file   Social History Narrative   [name] Card in [place].   Full.   Would not desire feeding tube.     Allergies  Allergen Reactions  . Avelox (Moxifloxacin Hcl In Nacl) Rash    pruritis     Outpatient Prescriptions Prior to Visit  Medication Sig Dispense Refill  . albuterol (PROVENTIL HFA) 108 (90 BASE) MCG/ACT inhaler Inhale 2 puffs  into the lungs every 6 (six) hours as needed.  3 Inhaler  1  . albuterol (PROVENTIL) (2.5 MG/3ML) 0.083% nebulizer solution Take 2.5 mg by nebulization every 4 (four) hours as needed.        Marland Kitchen aspirin 81 MG tablet Take 81 mg by mouth daily.        Marland Kitchen azithromycin (ZITHROMAX) 250 MG tablet Take 1 tablet (250 mg total) by mouth daily. Take two once then one daily until gone  6 each  0  . benzonatate (TESSALON) 200 MG capsule Take 1 capsule (200 mg total) by mouth 3 (three) times daily as needed.  100 capsule  6  . Calcium Carbonate-Vitamin D (RA CALCIUM PLUS VITAMIN D) 600-400 MG-UNIT per tablet Take 1 tablet by mouth 2 (two) times daily.        . cloNIDine (CATAPRES) 0.3 MG tablet Take 1 tablet (0.3 mg total) by mouth 2 (two) times daily.  180 tablet  3  . cyclobenzaprine (FLEXERIL) 10 MG tablet 2 (two) times daily. Twice daily and as needed      . donepezil (ARICEPT) 10 MG tablet Take 10 mg by mouth at bedtime.      Marland Kitchen exemestane (AROMASIN) 25 MG tablet Take 1 tablet (25 mg total) by mouth daily.  30 tablet   12  . famotidine (PEPCID) 20 MG tablet Take 1 tablet (20 mg total) by mouth at bedtime.  90 tablet  3  . fesoterodine (TOVIAZ) 8 MG TB24 Take 1 tablet (8 mg total) by mouth daily.  90 tablet  3  . furosemide (LASIX) 20 MG tablet as needed. Take one tablet by mouth twice daily as needed for edema      . glipiZIDE (GLIPIZIDE XL) 5 MG 24 hr tablet Take 1 tablet (5 mg total) by mouth daily.  90 tablet  3  . HYDROcodone-acetaminophen (VICODIN) 5-500 MG per tablet Take one tablet by mouth twice daily as needed for arthritis  90 tablet  0  . levothyroxine (SYNTHROID, LEVOTHROID) 150 MCG tablet Take 1 tablet (150 mcg total) by mouth daily.  90 tablet  1  . losartan (COZAAR) 50 MG tablet Take one tablet by mouth daily  90 tablet  3  . magnesium oxide (MAG-OX) 400 MG tablet Take 400 mg by mouth daily.        . Multiple Vitamin (MULTIVITAMIN) capsule Take 1 capsule by mouth daily.        . pantoprazole (PROTONIX) 40 MG tablet Take 1 tablet (40 mg total) by mouth 2 (two) times daily.  180 tablet  1  . polyethylene glycol (MIRALAX / GLYCOLAX) packet Take 17 g by mouth as needed.        . predniSONE (DELTASONE) 10 MG tablet Take 4 for two days three for two days two for two days one for two days  20 tablet  0  . sennosides-docusate sodium (SENOKOT-S) 8.6-50 MG tablet Take 1 tablet by mouth daily.      Marland Kitchen tiotropium (SPIRIVA) 18 MCG inhalation capsule Place 1 capsule (18 mcg total) into inhaler and inhale daily.  90 capsule  1  . traMADol (ULTRAM) 50 MG tablet 100 mg every morning, 50 mg three times daily prn pain  450 tablet  3  . venlafaxine XR (EFFEXOR-XR) 150 MG 24 hr capsule Take 1 capsule (150 mg total) by mouth daily.  90 capsule  3   No facility-administered medications prior to visit.      Review of Systems See HPI  No CP +DOE Bowels moving ok No dysuria     Objective:   Physical Exam BP 134/82  Pulse 98  Temp(Src) 98.5 F (36.9 C)  Wt 216 lb (97.977 kg)  BMI 37.06 kg/m2  SpO2  95%  Gen: Pleasant,   in no distress,  normal affect  ENT: No lesions,  mouth clear,  oropharynx clear, no postnasal drip  Neck: No JVD, no TMG, no carotid bruits  Lungs: No use of accessory muscles, no dullness to percussion, coarse BS with wheezes throughout lung, very tight  Cardiovascular: RRR, heart sounds normal, no murmur or gallops, no peripheral edema  Abdomen: soft and NT, no HSM,  BS normal  Musculoskeletal: No deformities, no cyanosis or clubbing  Neuro: alert, non focal  Skin: Warm, no lesions or rashes       Assessment & Plan:  1. COPD Gold D With acute exacerbation. Duonebs given in office.   Will place on Augmentin 875 mg twice daily x 10 days.   Mucinex DM Twice daily As needed Cough/congestion  Prednisone as directed.  Call me with an update next week.   If no improvement, advised follow up with pulmonary.

## 2012-06-07 NOTE — Patient Instructions (Signed)
Good to see you, Ana Nunez. Please say hello to Ana Nunez for me. Take Mucinex DM Twice daily As needed Cough/congestion  Take Augmentin and Prednisone as directed. Please call me next week with an update.

## 2012-06-13 ENCOUNTER — Other Ambulatory Visit: Payer: Self-pay

## 2012-06-13 MED ORDER — ALBUTEROL SULFATE (2.5 MG/3ML) 0.083% IN NEBU: 2.5000 mg | INHALATION_SOLUTION | RESPIRATORY_TRACT | Status: DC | PRN

## 2012-06-13 NOTE — Telephone Encounter (Signed)
Medicine sent to [facility]. Advised patient's daughter.

## 2012-06-13 NOTE — Telephone Encounter (Signed)
pts daughter, [name] Card left v/m requesting refill albuterol neb solution to [facility] [address]pt was seen 06/07/12. Kim request call back when sent to pharmacy. I tried to contact [name] to check on how pt is doing but could not reach [employer] by phone.

## 2012-06-23 ENCOUNTER — Inpatient Hospital Stay: Payer: Self-pay | Admitting: Internal Medicine

## 2012-06-23 LAB — CK TOTAL AND CKMB (NOT AT ARMC): CK-MB: <0.5 ng/mL — ABNORMAL LOW (ref 0.5–3.6)

## 2012-06-23 LAB — COMPREHENSIVE METABOLIC PANEL
BUN: 22 mg/dL — ABNORMAL HIGH (ref 7–18)
Bilirubin,Total: 0.4 mg/dL (ref 0.2–1.0)
Calcium, Total: 9.1 mg/dL (ref 8.5–10.1)
Co2: 29 mmol/L (ref 21–32)
EGFR (Non-African Amer.): 39 — ABNORMAL LOW
Glucose: 134 mg/dL — ABNORMAL HIGH (ref 65–99)
Osmolality: 279 (ref 275–301)
Potassium: 4.4 mmol/L (ref 3.5–5.1)
SGOT(AST): 27 U/L (ref 15–37)
SGPT (ALT): 31 U/L (ref 12–78)
Sodium: 137 mmol/L (ref 136–145)

## 2012-06-23 LAB — CBC
HCT: 42.2 % (ref 35.0–47.0)
HGB: 14.6 g/dL (ref 12.0–16.0)
MCHC: 34.6 g/dL (ref 32.0–36.0)
RBC: 4.36 10*6/uL (ref 3.80–5.20)
RDW: 13.5 % (ref 11.5–14.5)
WBC: 9 10*3/uL (ref 3.6–11.0)

## 2012-06-23 LAB — URINALYSIS, COMPLETE
Bacteria: NONE SEEN
Bilirubin,UR: NEGATIVE
Blood: NEGATIVE
Nitrite: NEGATIVE
Ph: 7 (ref 4.5–8.0)
RBC,UR: 1 /HPF (ref 0–5)
Squamous Epithelial: NONE SEEN
WBC UR: <1 /HPF (ref 0–5)

## 2012-06-24 ENCOUNTER — Telehealth: Payer: Self-pay

## 2012-06-24 LAB — BASIC METABOLIC PANEL
Anion Gap: 11 (ref 7–16)
Anion Gap: 9 (ref 7–16)
BUN: 23 mg/dL — ABNORMAL HIGH (ref 7–18)
BUN: 29 mg/dL — ABNORMAL HIGH (ref 7–18)
Calcium, Total: 9.3 mg/dL (ref 8.5–10.1)
Chloride: 100 mmol/L (ref 98–107)
Co2: 22 mmol/L (ref 21–32)
Co2: 25 mmol/L (ref 21–32)
Creatinine: 1.42 mg/dL — ABNORMAL HIGH (ref 0.60–1.30)
Creatinine: 1.32 mg/dL — ABNORMAL HIGH (ref 0.60–1.30)
EGFR (African American): 40 — ABNORMAL LOW
EGFR (African American): 44 — ABNORMAL LOW
EGFR (Non-African Amer.): 35 — ABNORMAL LOW
Glucose: 114 mg/dL — ABNORMAL HIGH (ref 65–99)
Potassium: 4.1 mmol/L (ref 3.5–5.1)
Potassium: 4.3 mmol/L (ref 3.5–5.1)
Sodium: 134 mmol/L — ABNORMAL LOW (ref 136–145)

## 2012-06-24 LAB — CBC WITH DIFFERENTIAL/PLATELET
Eosinophil #: 0 10*3/uL (ref 0.0–0.7)
Eosinophil %: 0 %
HCT: 41.4 % (ref 35.0–47.0)
HGB: 14 g/dL (ref 12.0–16.0)
MCHC: 33.8 g/dL (ref 32.0–36.0)
Monocyte #: 0.2 x10 3/mm (ref 0.2–0.9)
Monocyte %: 1.8 %
Neutrophil #: 8.1 10*3/uL — ABNORMAL HIGH (ref 1.4–6.5)
Neutrophil %: 85.9 %
RBC: 4.24 10*6/uL (ref 3.80–5.20)
RDW: 13.7 % (ref 11.5–14.5)
WBC: 9.4 10*3/uL (ref 3.6–11.0)

## 2012-06-24 LAB — LIPID PANEL
HDL Cholesterol: 63 mg/dL — ABNORMAL HIGH (ref 40–60)
Ldl Cholesterol, Calc: 91 mg/dL (ref 0–100)
Triglycerides: 119 mg/dL (ref 0–200)

## 2012-06-24 LAB — TSH: Thyroid Stimulating Horm: 0.524 u[IU]/mL

## 2012-06-24 LAB — HEMOGLOBIN A1C: Hemoglobin A1C: 7.9 % — ABNORMAL HIGH (ref 4.2–6.3)

## 2012-06-24 LAB — FOLATE: Folic Acid: 28.5 ng/mL (ref 3.1–100.0)

## 2012-06-24 NOTE — Telephone Encounter (Signed)
Spoke with Kim

## 2012-06-24 NOTE — Telephone Encounter (Signed)
Ana Nunez Card pts daughter said pt admitted to [facility] on 06/23/12 with stroke;pt is having MRI to look at MS lesions. Kim request call back from Dr Dayton Martes when she has time; does not want to leave further detailed message.

## 2012-06-25 DIAGNOSIS — I6789 Other cerebrovascular disease: Secondary | ICD-10-CM

## 2012-06-25 LAB — BASIC METABOLIC PANEL
Anion Gap: 7 (ref 7–16)
BUN: 28 mg/dL — ABNORMAL HIGH (ref 7–18)
Calcium, Total: 9 mg/dL (ref 8.5–10.1)
Co2: 26 mmol/L (ref 21–32)
Creatinine: 1.25 mg/dL (ref 0.60–1.30)
EGFR (African American): 47 — ABNORMAL LOW
EGFR (Non-African Amer.): 41 — ABNORMAL LOW
Glucose: 153 mg/dL — ABNORMAL HIGH (ref 65–99)
Osmolality: 279 (ref 275–301)

## 2012-06-26 LAB — BASIC METABOLIC PANEL
Creatinine: 1.09 mg/dL (ref 0.60–1.30)
EGFR (African American): 56 — ABNORMAL LOW
EGFR (Non-African Amer.): 48 — ABNORMAL LOW
Glucose: 148 mg/dL — ABNORMAL HIGH (ref 65–99)
Osmolality: 284 (ref 275–301)
Potassium: 4.1 mmol/L (ref 3.5–5.1)
Sodium: 138 mmol/L (ref 136–145)

## 2012-07-10 DIAGNOSIS — I10 Essential (primary) hypertension: Secondary | ICD-10-CM

## 2012-07-17 DIAGNOSIS — I6350 Cerebral infarction due to unspecified occlusion or stenosis of unspecified cerebral artery: Secondary | ICD-10-CM

## 2012-07-17 DIAGNOSIS — E119 Type 2 diabetes mellitus without complications: Secondary | ICD-10-CM

## 2012-07-17 DIAGNOSIS — I10 Essential (primary) hypertension: Secondary | ICD-10-CM

## 2012-07-17 DIAGNOSIS — J449 Chronic obstructive pulmonary disease, unspecified: Secondary | ICD-10-CM

## 2012-07-23 ENCOUNTER — Other Ambulatory Visit: Payer: Self-pay | Admitting: *Deleted

## 2012-07-23 DIAGNOSIS — C50911 Malignant neoplasm of unspecified site of right female breast: Secondary | ICD-10-CM

## 2012-07-23 MED ORDER — EXEMESTANE 25 MG PO TABS: 25.0000 mg | ORAL_TABLET | Freq: Every day | ORAL | Status: DC

## 2012-07-26 DIAGNOSIS — R51 Headache: Secondary | ICD-10-CM

## 2012-07-26 DIAGNOSIS — I10 Essential (primary) hypertension: Secondary | ICD-10-CM

## 2012-08-08 ENCOUNTER — Ambulatory Visit: Payer: Medicare Other | Admitting: Family Medicine

## 2012-08-16 ENCOUNTER — Encounter: Payer: Self-pay | Admitting: Family Medicine

## 2012-08-16 ENCOUNTER — Ambulatory Visit (INDEPENDENT_AMBULATORY_CARE_PROVIDER_SITE_OTHER): Payer: Medicare Other | Admitting: Family Medicine

## 2012-08-16 VITALS — BP 110/62 | HR 83 | Temp 97.7°F | Wt 196.0 lb

## 2012-08-16 DIAGNOSIS — N189 Chronic kidney disease, unspecified: Secondary | ICD-10-CM

## 2012-08-16 DIAGNOSIS — F329 Major depressive disorder, single episode, unspecified: Secondary | ICD-10-CM

## 2012-08-16 DIAGNOSIS — F3289 Other specified depressive episodes: Secondary | ICD-10-CM

## 2012-08-16 DIAGNOSIS — J4489 Other specified chronic obstructive pulmonary disease: Secondary | ICD-10-CM

## 2012-08-16 DIAGNOSIS — E119 Type 2 diabetes mellitus without complications: Secondary | ICD-10-CM

## 2012-08-16 DIAGNOSIS — E039 Hypothyroidism, unspecified: Secondary | ICD-10-CM

## 2012-08-16 DIAGNOSIS — I639 Cerebral infarction, unspecified: Secondary | ICD-10-CM

## 2012-08-16 DIAGNOSIS — J449 Chronic obstructive pulmonary disease, unspecified: Secondary | ICD-10-CM

## 2012-08-16 DIAGNOSIS — I6350 Cerebral infarction due to unspecified occlusion or stenosis of unspecified cerebral artery: Secondary | ICD-10-CM

## 2012-08-16 DIAGNOSIS — R634 Abnormal weight loss: Secondary | ICD-10-CM

## 2012-08-16 DIAGNOSIS — G35 Multiple sclerosis: Secondary | ICD-10-CM

## 2012-08-16 DIAGNOSIS — I10 Essential (primary) hypertension: Secondary | ICD-10-CM

## 2012-08-16 MED ORDER — CLOPIDOGREL BISULFATE 75 MG PO TABS: 75.0000 mg | ORAL_TABLET | Freq: Every day | ORAL | Status: AC

## 2012-08-16 NOTE — Patient Instructions (Signed)
Great to see you!  Please follow up with me in 3 months! 

## 2012-08-16 NOTE — Progress Notes (Signed)
Subjective:    Patient ID: Ana Nunez, female    DOB: [birth year], 79 y.o.   MRN: [id number]  HPI  Pleasant 79 yo female with complicated medical history, including h/o MS, COPD and recurrent breast CA here with daughter, [name] for SNF follow up.  Was admitted to [facility] on 06/23/12 and found to have an acute CVA.  No residual deficits.  Went to [facility] for rehab after discharge.   Lab Results  Component Value Date   HGBA1C 7.5* 12/14/2011   Kidney disease- CKD 3- Lab Results  Component Value Date   CREATININE 1.4* 05/01/2012   Referred to renal- Dr. Lowell Guitar.  Note from 05/2012 reviewed.  He agreed with family to pursue conservative approach- routine monitoring of kidney function and if it worsens, consider renal ultrasound.   HTN- was having periods of elevated BP in evenings, Cozaar increased to 50 mg twice daily.  BP now have been great.  Denies any symptoms of orthostasis.  Appetite decreased.  Has lost weight (20 pounds).  Pt and daughter think it is from her MS medications causing dry mouth and tooth decay. Also more physically active with PT twice daily. Wt Readings from Last 3 Encounters:  08/16/12 196 lb (88.905 kg)  06/07/12 216 lb (97.977 kg)  05/06/12 227 lb (102.967 kg)     Patient Active Problem List   Diagnosis Date Noted  . CVA (cerebral vascular accident) 08/16/2012  . Chronic kidney disease 05/01/2012  . Memory loss 09/18/2011  . Recurrent breast cancer 03/30/2011  . CONSTIPATION 07/05/2007  . DYSPHAGIA 07/05/2007  . ADENOCARCINOMA, BREAST 12/04/2006  . HYPOTHYROIDISM 12/04/2006  . DIABETES MELLITUS, TYPE II 12/04/2006  . DEPRESSION 12/04/2006  . SCLEROSIS, MULTIPLE 12/04/2006  . HYPERTENSION 12/04/2006  . VOCAL CORD DISORDER 12/04/2006  . COPD Gold D 12/04/2006  . GERD 12/04/2006  . [name] 12/04/2006  . INVOLUNTARY MOVEMENTS, ABNORMAL 12/04/2006  . POSITIVE PPD 12/04/2006  . URINARY INCONTINENCE 10/30/2006  . COLONIC POLYPS 10/22/2003  .  GASTRITIS, CHRONIC 01/23/2002   Past Medical History  Diagnosis Date  . [name]   . Fatty liver   . GERD (gastroesophageal reflux disease)   . Type II or unspecified type diabetes mellitus without mention of complication, not stated as uncontrolled   . Positive PPD   . Abnormal involuntary movements(781.0)   . Hypercholesteremia   . Hypothyroidism   . Unspecified urinary incontinence   . Vocal cord dysfunction   . Multiple sclerosis   . Hypertension   . Depression   . CAD (coronary artery disease)   . COPD (chronic obstructive pulmonary disease)   . Asthma   . Community acquired pneumonia   . Arthritis   . Adenocarcinoma, breast     Right breast  . Emphysema of lung   . Neuromuscular disorder     MS  . TIA (transient ischemic attack)   . Breast cancer    Past Surgical History  Procedure Laterality Date  . Appendectomy  1942  . Tonsillectomy  1951  . Dilation and curettage of uterus  1964  . Nasal sinus surgery  1999  . Breast lumpectomy  1/06    Right  . Fracture surgery  may 2010    R ankle    History  Substance Use Topics  . Smoking status: Former Smoker -- 1.50 packs/day for 25 years    Types: Cigarettes    Quit date: 02/07/1984  . Smokeless tobacco: Never Used  . Alcohol Use: Yes  Comment: 1-2 glaases of wine daily   Family History  Problem Relation Age of Onset  . Coronary artery disease Father   . Heart disease Father   . Pancreatic cancer Mother   . Emphysema Mother   . Diabetes Mother   . COPD Mother   . Pancreatic cancer Sister   . Cancer Sister     pancreatic  . Lymphoma Sister   . Cancer Sister     pancreatic  . Diabetes Sister   . Thyroid cancer Daughter   . Hypertension Sister   . Hypertension Brother   . Cancer Brother     CML  . Liver cancer Sister   . Cirrhosis Sister   . Cancer Maternal Aunt     breast  . Cancer Brother     bladder   Allergies  Allergen Reactions  . Avelox (Moxifloxacin Hcl In Nacl) Rash    pruritis    Current Outpatient Prescriptions on File Prior to Visit  Medication Sig Dispense Refill  . albuterol (PROVENTIL HFA) 108 (90 BASE) MCG/ACT inhaler Inhale 2 puffs into the lungs every 6 (six) hours as needed.  3 Inhaler  1  . albuterol (PROVENTIL) (2.5 MG/3ML) 0.083% nebulizer solution Take 3 mLs (2.5 mg total) by nebulization every 4 (four) hours as needed.  75 mL  3  . amoxicillin-clavulanate (AUGMENTIN) 875-125 MG per tablet Take 1 tablet by mouth 2 (two) times daily.  20 tablet  0  . aspirin 81 MG tablet Take 81 mg by mouth daily.        . benzonatate (TESSALON) 200 MG capsule Take 1 capsule (200 mg total) by mouth 3 (three) times daily as needed.  100 capsule  6  . Calcium Carbonate-Vitamin D (RA CALCIUM PLUS VITAMIN D) 600-400 MG-UNIT per tablet Take 1 tablet by mouth 2 (two) times daily.        . cloNIDine (CATAPRES) 0.3 MG tablet Take 1 tablet (0.3 mg total) by mouth 2 (two) times daily.  180 tablet  3  . cyclobenzaprine (FLEXERIL) 10 MG tablet 2 (two) times daily. Twice daily and as needed      . donepezil (ARICEPT) 10 MG tablet Take 10 mg by mouth at bedtime.      Marland Kitchen exemestane (AROMASIN) 25 MG tablet Take 1 tablet (25 mg total) by mouth daily.  90 tablet  3  . famotidine (PEPCID) 20 MG tablet Take 1 tablet (20 mg total) by mouth at bedtime.  90 tablet  3  . fesoterodine (TOVIAZ) 8 MG TB24 Take 1 tablet (8 mg total) by mouth daily.  90 tablet  3  . furosemide (LASIX) 20 MG tablet as needed. Take one tablet by mouth twice daily as needed for edema      . glipiZIDE (GLIPIZIDE XL) 5 MG 24 hr tablet Take 1 tablet (5 mg total) by mouth daily.  90 tablet  3  . HYDROcodone-acetaminophen (VICODIN) 5-500 MG per tablet Take one tablet by mouth twice daily as needed for arthritis  90 tablet  0  . levothyroxine (SYNTHROID, LEVOTHROID) 150 MCG tablet Take 1 tablet (150 mcg total) by mouth daily.  90 tablet  1  . losartan (COZAAR) 50 MG tablet Take one tablet by mouth daily  90 tablet  3  .  magnesium oxide (MAG-OX) 400 MG tablet Take 400 mg by mouth daily.        . Multiple Vitamin (MULTIVITAMIN) capsule Take 1 capsule by mouth daily.        Marland Kitchen  pantoprazole (PROTONIX) 40 MG tablet Take 1 tablet (40 mg total) by mouth 2 (two) times daily.  180 tablet  1  . polyethylene glycol (MIRALAX / GLYCOLAX) packet Take 17 g by mouth as needed.        . predniSONE (DELTASONE) 10 MG tablet Take 4 for two days three for two days two for two days one for two days  20 tablet  0  . sennosides-docusate sodium (SENOKOT-S) 8.6-50 MG tablet Take 1 tablet by mouth daily.      Marland Kitchen tiotropium (SPIRIVA) 18 MCG inhalation capsule Place 1 capsule (18 mcg total) into inhaler and inhale daily.  90 capsule  1  . traMADol (ULTRAM) 50 MG tablet 100 mg every morning, 50 mg three times daily prn pain  450 tablet  3  . venlafaxine XR (EFFEXOR-XR) 150 MG 24 hr capsule Take 1 capsule (150 mg total) by mouth daily.  90 capsule  3   No current facility-administered medications on file prior to visit.   The PMH, PSH, Social History, Family History, Medications, and allergies have been reviewed in [facility], and have been updated if relevant.  Review of Systems    See HPI Objective:   Physical Exam BP 110/62  Pulse 83  Temp(Src) 97.7 F (36.5 C)  Wt 196 lb (88.905 kg)  BMI 33.63 kg/m2  SpO2 97% Gen:  Alert, pleasant, missing several front teeth (continues to loose teeth) Resp:  POS upper airway sounds (basline), otherwise clear CVS:  RRR Psych:  Good eye contact, alert and pleasant. Ext:  No edema    Assessment & Plan:   1. CVA (cerebral vascular accident) No late effects. On Plavix.  2. COPD Gold D Lungs stable today. She will schedule follow up with Dr. Delford Field (last saw him in 04/2012).  3. Chronic kidney disease, unspecified stage Will continue to monitor.  4. DIABETES MELLITUS, TYPE II Stable.   5. DEPRESSION Stable on current meds.  6. HYPERTENSION Improved on increased dose of Cozaar.  7.  HYPOTHYROIDISM Plan follow up labs in 3 months.  8. SCLEROSIS, MULTIPLE Stable.  9. Loss of weight Multifactorial. Advised following up again with her dentist. Continue to monitor.

## 2012-08-22 ENCOUNTER — Other Ambulatory Visit: Payer: Self-pay

## 2012-08-22 MED ORDER — LOSARTAN POTASSIUM 50 MG PO TABS: 50.0000 mg | ORAL_TABLET | Freq: Two times a day (BID) | ORAL | Status: AC

## 2012-08-22 NOTE — Telephone Encounter (Signed)
Pt request refill losartan to [facility]. Pt advised done.

## 2012-08-23 ENCOUNTER — Other Ambulatory Visit: Payer: Self-pay | Admitting: *Deleted

## 2012-08-23 MED ORDER — FAMOTIDINE 20 MG PO TABS: 20.0000 mg | ORAL_TABLET | Freq: Every day | ORAL | Status: AC

## 2012-08-23 MED ORDER — PANTOPRAZOLE SODIUM 40 MG PO TBEC: 40.0000 mg | DELAYED_RELEASE_TABLET | Freq: Two times a day (BID) | ORAL | Status: AC

## 2012-08-29 ENCOUNTER — Ambulatory Visit (INDEPENDENT_AMBULATORY_CARE_PROVIDER_SITE_OTHER): Payer: Medicare Other | Admitting: *Deleted

## 2012-08-29 DIAGNOSIS — R300 Dysuria: Secondary | ICD-10-CM

## 2012-08-29 LAB — POCT URINALYSIS DIPSTICK
Bilirubin, UA: NEGATIVE
Blood, UA: NEGATIVE
Glucose, UA: NEGATIVE
Nitrite, UA: NEGATIVE
Urobilinogen, UA: NEGATIVE

## 2012-08-31 LAB — URINE CULTURE

## 2012-09-11 ENCOUNTER — Other Ambulatory Visit: Payer: Medicare Other

## 2012-09-19 ENCOUNTER — Telehealth: Payer: Self-pay | Admitting: *Deleted

## 2012-09-19 NOTE — Telephone Encounter (Signed)
Pt daughter called in stating that her mom had a stroke and needed to r/s her appts. She request that the pt come in after [name]. gv appt d/t for 10/17/12 labs@11 :15am and ov @ 11:45am..the patient is aware...td

## 2012-09-26 ENCOUNTER — Ambulatory Visit (INDEPENDENT_AMBULATORY_CARE_PROVIDER_SITE_OTHER): Payer: Medicare Other | Admitting: Critical Care Medicine

## 2012-09-26 ENCOUNTER — Encounter: Payer: Self-pay | Admitting: Critical Care Medicine

## 2012-09-26 VITALS — BP 126/88 | HR 84 | Temp 97.4°F | Ht 64.0 in | Wt 220.0 lb

## 2012-09-26 DIAGNOSIS — J449 Chronic obstructive pulmonary disease, unspecified: Secondary | ICD-10-CM

## 2012-09-26 NOTE — Progress Notes (Signed)
Subjective:    Patient ID: Ana Nunez, female    DOB: 08/13/1929, 79 y.o.   MRN: [id number]  HPI  :  79 y.o.  WF with hx of AB/COPD, VCD , and MS   09/26/2012 Chief Complaint  Patient presents with  . 5 month follow up    Has good days and bad days.  Does have a prod cough with white, yellow, or gray mucus, wheezing, chest tightness at times, and SOB with exertion and at rest at times.    Pt required Abx 08/2012.  CVA 06/2012 with R sided residual. Pt did rehab.  Now is slower.  Pt is less active.   Injections done in knees , bone on bone.   Reviewed  MOST form   Past Medical History  Diagnosis Date  . [name]   . Fatty liver   . GERD (gastroesophageal reflux disease)   . Type II or unspecified type diabetes mellitus without mention of complication, not stated as uncontrolled   . Positive PPD   . Abnormal involuntary movements(781.0)   . Hypercholesteremia   . Hypothyroidism   . Unspecified urinary incontinence   . Vocal cord dysfunction   . Multiple sclerosis   . Hypertension   . Depression   . CAD (coronary artery disease)   . COPD (chronic obstructive pulmonary disease)   . Asthma   . Community acquired pneumonia   . Arthritis   . Adenocarcinoma, breast     Right breast  . Emphysema of lung   . Neuromuscular disorder     MS  . TIA (transient ischemic attack)   . Breast cancer      Family History  Problem Relation Age of Onset  . Coronary artery disease Father   . Heart disease Father   . Pancreatic cancer Mother   . Emphysema Mother   . Diabetes Mother   . COPD Mother   . Pancreatic cancer Sister   . Cancer Sister     pancreatic  . Lymphoma Sister   . Cancer Sister     pancreatic  . Diabetes Sister   . Thyroid cancer Daughter   . Hypertension Sister   . Hypertension Brother   . Cancer Brother     CML  . Liver cancer Sister   . Cirrhosis Sister   . Cancer Maternal Aunt     breast  . Cancer Brother     bladder     History   Social History   . Marital Status: Married    Spouse Name: N/A    Number of Children: N/A  . Years of Education: N/A   Occupational History  . Housewife     former nurse   Social History Main Topics  . Smoking status: Former Smoker -- 1.50 packs/day for 25 years    Types: Cigarettes    Quit date: 02/07/1984  . Smokeless tobacco: Never Used  . Alcohol Use: Yes     Comment: 1-2 glaases of wine daily  . Drug Use: No  . Sexual Activity: Not on file   Other Topics Concern  . Not on file   Social History Narrative   [name] Card in [place].   Full.   Would not desire feeding tube.     Allergies  Allergen Reactions  . Avelox [Moxifloxacin Hcl In Nacl] Rash    pruritis     Outpatient Prescriptions Prior to Visit  Medication Sig Dispense Refill  . albuterol (PROVENTIL HFA) 108 (90 BASE) MCG/ACT  inhaler Inhale 2 puffs into the lungs every 6 (six) hours as needed.  3 Inhaler  1  . albuterol (PROVENTIL) (2.5 MG/3ML) 0.083% nebulizer solution Take 3 mLs (2.5 mg total) by nebulization every 4 (four) hours as needed.  75 mL  3  . benzonatate (TESSALON) 200 MG capsule Take 1 capsule (200 mg total) by mouth 3 (three) times daily as needed.  100 capsule  6  . Calcium Carbonate-Vitamin D (RA CALCIUM PLUS VITAMIN D) 600-400 MG-UNIT per tablet Take 1 tablet by mouth 2 (two) times daily.        . cloNIDine (CATAPRES) 0.3 MG tablet Take 1 tablet (0.3 mg total) by mouth 2 (two) times daily.  180 tablet  3  . clopidogrel (PLAVIX) 75 MG tablet Take 1 tablet (75 mg total) by mouth daily.  90 tablet  1  . donepezil (ARICEPT) 10 MG tablet Take 10 mg by mouth at bedtime.      Marland Kitchen exemestane (AROMASIN) 25 MG tablet Take 1 tablet (25 mg total) by mouth daily.  90 tablet  3  . famotidine (PEPCID) 20 MG tablet Take 1 tablet (20 mg total) by mouth at bedtime.  90 tablet  1  . fesoterodine (TOVIAZ) 8 MG TB24 Take 1 tablet (8 mg total) by mouth daily.  90 tablet  3  . furosemide (LASIX) 20 MG tablet as needed. Take one tablet by  mouth twice daily as needed for edema      . glipiZIDE (GLIPIZIDE XL) 5 MG 24 hr tablet Take 1 tablet (5 mg total) by mouth daily.  90 tablet  3  . HYDROcodone-acetaminophen (VICODIN) 5-500 MG per tablet Take one tablet by mouth twice daily as needed for arthritis  90 tablet  0  . levothyroxine (SYNTHROID, LEVOTHROID) 150 MCG tablet Take 1 tablet (150 mcg total) by mouth daily.  90 tablet  1  . losartan (COZAAR) 50 MG tablet Take 1 tablet (50 mg total) by mouth 2 (two) times daily.  180 tablet  1  . magnesium oxide (MAG-OX) 400 MG tablet Take 400 mg by mouth daily.        . Multiple Vitamin (MULTIVITAMIN) capsule Take 1 capsule by mouth daily.        . pantoprazole (PROTONIX) 40 MG tablet Take 1 tablet (40 mg total) by mouth 2 (two) times daily.  180 tablet  1  . polyethylene glycol (MIRALAX / GLYCOLAX) packet Take 17 g by mouth as needed.        . sennosides-docusate sodium (SENOKOT-S) 8.6-50 MG tablet Take 2 tablets by mouth daily.       Marland Kitchen tiotropium (SPIRIVA) 18 MCG inhalation capsule Place 1 capsule (18 mcg total) into inhaler and inhale daily.  90 capsule  1  . traMADol (ULTRAM) 50 MG tablet 100 mg every morning, 50 mg three times daily prn pain  450 tablet  3  . venlafaxine XR (EFFEXOR-XR) 150 MG 24 hr capsule Take 1 capsule (150 mg total) by mouth daily.  90 capsule  3   No facility-administered medications prior to visit.      Review of Systems  Constitutional:   No  weight loss, night sweats,  Fevers, chills,   +fatigue, lassitude. HEENT:   No headaches,  Difficulty swallowing,  Tooth/dental problems,  Sore throat,                No sneezing, itching, ear ache,  +nasal congestion, post nasal drip,   CV:  No chest pain,  Orthopnea, PND, swelling in lower extremities, anasarca, dizziness, palpitations  GI  No heartburn, indigestion, abdominal pain, nausea, vomiting, diarrhea, change in bowel habits, loss of appetite  Resp: Notes  shortness of breath with exertion not    at  rest.  Notes some  mucus, notes  productive cough,  Notes  non-productive cough,  No coughing up of blood. No chest wall deformity  Skin: no rash or lesions.  GU: no dysuria, change in color of urine, no urgency or frequency.  No flank pain.  MS:  No joint pain or swelling.  No decreased range of motion.  No back pain.  Psych:  No change in mood or affect. No depression or anxiety.  No memory loss.     Objective:   Physical Exam BP 126/88  Pulse 84  Temp(Src) 97.4 F (36.3 C) (Oral)  Ht 5\' 4"  (1.626 m)  Wt 220 lb (99.791 kg)  BMI 37.74 kg/m2  SpO2 97%  Gen: Pleasant,   in no distress,  normal affect  ENT: No lesions,  mouth clear,  oropharynx clear, no postnasal drip  Neck: No JVD, no TMG, no carotid bruits  Lungs: No use of accessory muscles, no dullness to percussion, coarse BS , reduced upper airway  pseudo-wheeze  Cardiovascular: RRR, heart sounds normal, no murmur or gallops, no peripheral edema  Abdomen: soft and NT, no HSM,  BS normal  Musculoskeletal: No deformities, no cyanosis or clubbing  Neuro: alert, non focal  Skin: Warm, no lesions or rashes     No results found.    Assessment & Plan:   COPD Gold D Gold stage D. COPD stable at this time Recent recurrent stroke makes this patient extremely fragile therefore we spent the majority of this visit on end-of-life discussions with both patient and daughter. A MOST form was given to the patient and daughter for their review. She is an established DO NOT INTUBATE DO NOT RESUSCITATE.  Plan is to maintain inhaled medications at this time    Updated Medication List Outpatient Encounter Prescriptions as of 09/26/2012  Medication Sig Dispense Refill  . albuterol (PROVENTIL HFA) 108 (90 BASE) MCG/ACT inhaler Inhale 2 puffs into the lungs every 6 (six) hours as needed.  3 Inhaler  1  . albuterol (PROVENTIL) (2.5 MG/3ML) 0.083% nebulizer solution Take 3 mLs (2.5 mg total) by nebulization every 4 (four) hours as  needed.  75 mL  3  . benzonatate (TESSALON) 200 MG capsule Take 1 capsule (200 mg total) by mouth 3 (three) times daily as needed.  100 capsule  6  . Calcium Carbonate-Vitamin D (RA CALCIUM PLUS VITAMIN D) 600-400 MG-UNIT per tablet Take 1 tablet by mouth 2 (two) times daily.        . cloNIDine (CATAPRES) 0.3 MG tablet Take 1 tablet (0.3 mg total) by mouth 2 (two) times daily.  180 tablet  3  . clopidogrel (PLAVIX) 75 MG tablet Take 1 tablet (75 mg total) by mouth daily.  90 tablet  1  . donepezil (ARICEPT) 10 MG tablet Take 10 mg by mouth at bedtime.      Marland Kitchen exemestane (AROMASIN) 25 MG tablet Take 1 tablet (25 mg total) by mouth daily.  90 tablet  3  . famotidine (PEPCID) 20 MG tablet Take 1 tablet (20 mg total) by mouth at bedtime.  90 tablet  1  . fesoterodine (TOVIAZ) 8 MG TB24 Take 1 tablet (8 mg total) by mouth daily.  90 tablet  3  .  furosemide (LASIX) 20 MG tablet as needed. Take one tablet by mouth twice daily as needed for edema      . glipiZIDE (GLIPIZIDE XL) 5 MG 24 hr tablet Take 1 tablet (5 mg total) by mouth daily.  90 tablet  3  . HYDROcodone-acetaminophen (VICODIN) 5-500 MG per tablet Take one tablet by mouth twice daily as needed for arthritis  90 tablet  0  . levothyroxine (SYNTHROID, LEVOTHROID) 150 MCG tablet Take 1 tablet (150 mcg total) by mouth daily.  90 tablet  1  . losartan (COZAAR) 50 MG tablet Take 1 tablet (50 mg total) by mouth 2 (two) times daily.  180 tablet  1  . magnesium oxide (MAG-OX) 400 MG tablet Take 400 mg by mouth daily.        . Multiple Vitamin (MULTIVITAMIN) capsule Take 1 capsule by mouth daily.        . pantoprazole (PROTONIX) 40 MG tablet Take 1 tablet (40 mg total) by mouth 2 (two) times daily.  180 tablet  1  . polyethylene glycol (MIRALAX / GLYCOLAX) packet Take 17 g by mouth as needed.        . sennosides-docusate sodium (SENOKOT-S) 8.6-50 MG tablet Take 2 tablets by mouth daily.       Marland Kitchen tiotropium (SPIRIVA) 18 MCG inhalation capsule Place 1  capsule (18 mcg total) into inhaler and inhale daily.  90 capsule  1  . traMADol (ULTRAM) 50 MG tablet 100 mg every morning, 50 mg three times daily prn pain  450 tablet  3  . venlafaxine XR (EFFEXOR-XR) 150 MG 24 hr capsule Take 1 capsule (150 mg total) by mouth daily.  90 capsule  3   No facility-administered encounter medications on file as of 09/26/2012.

## 2012-09-26 NOTE — Patient Instructions (Addendum)
No change in medications. Return in   3 months 

## 2012-09-26 NOTE — Assessment & Plan Note (Signed)
Gold stage D. COPD stable at this time Recent recurrent stroke makes this patient extremely fragile therefore we spent the majority of this visit on end-of-life discussions with both patient and daughter. A MOST form was given to the patient and daughter for their review. She is an established DO NOT INTUBATE DO NOT RESUSCITATE.  Plan is to maintain inhaled medications at this time

## 2012-10-02 ENCOUNTER — Other Ambulatory Visit: Payer: Medicare Other | Admitting: Lab

## 2012-10-02 ENCOUNTER — Ambulatory Visit: Payer: Medicare Other | Admitting: Physician Assistant

## 2012-10-14 ENCOUNTER — Ambulatory Visit
Admission: RE | Admit: 2012-10-14 | Discharge: 2012-10-14 | Disposition: A | Discharge status: Home / Self Care | Payer: Medicare Other | Source: Ambulatory Visit | Attending: Oncology | Admitting: Oncology

## 2012-10-14 DIAGNOSIS — C50919 Malignant neoplasm of unspecified site of unspecified female breast: Secondary | ICD-10-CM

## 2012-10-17 ENCOUNTER — Encounter: Payer: Self-pay | Admitting: Physician Assistant

## 2012-10-17 ENCOUNTER — Other Ambulatory Visit (HOSPITAL_BASED_OUTPATIENT_CLINIC_OR_DEPARTMENT_OTHER): Payer: Medicare Other | Admitting: Lab

## 2012-10-17 ENCOUNTER — Telehealth: Payer: Self-pay | Admitting: *Deleted

## 2012-10-17 ENCOUNTER — Ambulatory Visit (HOSPITAL_BASED_OUTPATIENT_CLINIC_OR_DEPARTMENT_OTHER): Payer: Medicare Other | Admitting: Physician Assistant

## 2012-10-17 VITALS — BP 164/96 | HR 73 | Temp 97.5°F | Resp 18 | Ht 64.0 in | Wt 220.1 lb

## 2012-10-17 DIAGNOSIS — C50911 Malignant neoplasm of unspecified site of right female breast: Secondary | ICD-10-CM

## 2012-10-17 DIAGNOSIS — C50919 Malignant neoplasm of unspecified site of unspecified female breast: Secondary | ICD-10-CM

## 2012-10-17 LAB — CBC WITH DIFFERENTIAL/PLATELET
Basophils Absolute: 0.1 10*3/uL (ref 0.0–0.1)
EOS%: 3.8 % (ref 0.0–7.0)
Eosinophils Absolute: 0.4 10*3/uL (ref 0.0–0.5)
HGB: 13.4 g/dL (ref 11.6–15.9)
LYMPH%: 26.7 % (ref 14.0–49.7)
MCH: 33.5 pg (ref 25.1–34.0)
MCV: 99.4 fL (ref 79.5–101.0)
MONO%: 8 % (ref 0.0–14.0)
NEUT#: 5.6 10*3/uL (ref 1.5–6.5)
Platelets: 199 10*3/uL (ref 145–400)
RBC: 3.99 10*6/uL (ref 3.70–5.45)

## 2012-10-17 LAB — COMPREHENSIVE METABOLIC PANEL (CC13)
AST: 20 U/L (ref 5–34)
Alkaline Phosphatase: 93 U/L (ref 40–150)
BUN: 34.7 mg/dL — ABNORMAL HIGH (ref 7.0–26.0)
Creatinine: 1.2 mg/dL — ABNORMAL HIGH (ref 0.6–1.1)

## 2012-10-17 MED ORDER — EXEMESTANE 25 MG PO TABS: 25.0000 mg | ORAL_TABLET | Freq: Every day | ORAL | Status: DC

## 2012-10-17 NOTE — Progress Notes (Signed)
ID: Rubie Maid   DOB: Feb 09, 1929  MR#: [id number]  CSN#:[id number]  HISTORY OF PRESENT ILLNESS Lorenza has a prior history of breast cancer as detailed below, and we had released her from our care April 2011. More recently however she had mammography showing a possible of recurrence in the right breast and MRIs suggesting abnormalities in both breasts. Biopsy of the right breast mass on 03/17/2011 showed ([id number]) an invasive ductal breast cancer which is 100% estrogen and 100% progesterone receptor positive, with an elevated proliferation marker at 45%, and importantly with amplification of HER-2, the ratio being 3.10. Biopsy of the left breast was benign.  INTERVAL HISTORY:  Iya returns today accompanied by her daughter [name] for followup of her recurrent right breast cancer. Antoninette continues on exemestane with good tolerance. She denies any problems with hot flashes or vaginal dryness. Of course she has general aches and pains which are chronic, but denies any known increase in arthralgias secondary to the exemestane. She is getting injections in both knees and in her shoulder every 2 months for palliative pain control.  Since her visit here in March, interval history is notable for Saryiah having had an acute CVA in May.  She had a short stay in rehabilitation, and has recovered well.  She continues to be followed very closely by Dr. Dayton Martes and Dr. Delford Field for her multiple comorbidities. In fact at her recent appointment with Dr. Delford Field in August, they begin discussing end-of-life issues.   REVIEW OF SYSTEMS: Eilyn has had no recent fevers or chills. She denies any rashes and has had no abnormal bleeding. Her appetite varies, some days good, some days not. She has no problems with nausea. She has chronic constipation. She denies dysuria or hematuria. She also has a productive cough and shortness of breath secondary to COPD. She has occasional chest pain. She continues to have headaches. She is weak and  tired. She is ambulating with a walker.  A detailed review of systems is otherwise stable.   PAST MEDICAL HISTORY: Past Medical History  Diagnosis Date  . [name]   . Fatty liver   . GERD (gastroesophageal reflux disease)   . Type II or unspecified type diabetes mellitus without mention of complication, not stated as uncontrolled   . Positive PPD   . Abnormal involuntary movements(781.0)   . Hypercholesteremia   . Hypothyroidism   . Unspecified urinary incontinence   . Vocal cord dysfunction   . Multiple sclerosis   . Hypertension   . Depression   . CAD (coronary artery disease)   . COPD (chronic obstructive pulmonary disease)   . Asthma   . Community acquired pneumonia   . Arthritis   . Adenocarcinoma, breast     Right breast  . Emphysema of lung   . Neuromuscular disorder     MS  . TIA (transient ischemic attack)   . Breast cancer     PAST SURGICAL HISTORY: Past Surgical History  Procedure Laterality Date  . Appendectomy  1942  . Tonsillectomy  1951  . Dilation and curettage of uterus  1964  . Nasal sinus surgery  1999  . Breast lumpectomy  1/06    Right  . Fracture surgery  may 2010    R ankle     FAMILY HISTORY Family History  Problem Relation Age of Onset  . Coronary artery disease Father   . Heart disease Father   . Pancreatic cancer Mother   . Emphysema Mother   . Diabetes  Mother   . COPD Mother   . Pancreatic cancer Sister   . Cancer Sister     pancreatic  . Lymphoma Sister   . Cancer Sister     pancreatic  . Diabetes Sister   . Thyroid cancer Daughter   . Hypertension Sister   . Hypertension Brother   . Cancer Brother     CML  . Liver cancer Sister   . Cirrhosis Sister   . Cancer Maternal Aunt     breast  . Cancer Brother     bladder  The patient's father died at the age of 16 from cardiac disease. The patient's mother died at the age of 21 from complications of diabetes and emphysema.  The patient has two brothers surviving.  She  had three sisters; one died from pancreatic cancer, one with complications from transverse myelitis, the other from COPD.  GYNECOLOGIC HISTORY: She is G6, P5, parity age 53.  The patient's last menstrual period was in the 1980s. She never took hormone replacement.    SOCIAL HISTORY: She is a retired Engineer, civil (consulting).  Her husband,  also retired is here with her today as is her daughter, [name] who used to be Archivist.  The other children are [name], who lives in [place] and is a Medical illustrator.  [name], who lives in [place] and works for a theme park.  [name], who lives in [place] and is an Scientist, water quality; and [name], who lives in [place] and works at [employer]. The patient has 13 grandchildren and one great-grandchild.  She is a member of [employer]. 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   ADVANCED DIRECTIVES: in place  HEALTH MAINTENANCE: History  Substance Use Topics  . Smoking status: Former Smoker -- 1.50 packs/day for 25 years    Types: Cigarettes    Quit date: 02/07/1984  . Smokeless tobacco: Never Used  . Alcohol Use: Yes     Comment: 1-2 glaases of wine daily     Colonoscopy:  PAP:  Bone density:  Lipid panel:  Allergies  Allergen Reactions  . Avelox [Moxifloxacin Hcl In Nacl] Rash    pruritis    Current Outpatient Prescriptions  Medication Sig Dispense Refill  . albuterol (PROVENTIL HFA) 108 (90 BASE) MCG/ACT inhaler Inhale 2 puffs into the lungs every 6 (six) hours as needed.  3 Inhaler  1  . albuterol (PROVENTIL) (2.5 MG/3ML) 0.083% nebulizer solution Take 3 mLs (2.5 mg total) by nebulization every 4 (four) hours as needed.  75 mL  3  . benzonatate (TESSALON) 200 MG capsule Take 1 capsule (200 mg total) by mouth 3 (three) times daily as needed.  100 capsule  6  . Calcium Carbonate-Vitamin D (RA CALCIUM PLUS VITAMIN D) 600-400 MG-UNIT per tablet Take 1 tablet by mouth 2 (two) times daily.        . cloNIDine (CATAPRES) 0.3 MG tablet Take 1 tablet (0.3 mg total) by  mouth 2 (two) times daily.  180 tablet  3  . clopidogrel (PLAVIX) 75 MG tablet Take 1 tablet (75 mg total) by mouth daily.  90 tablet  1  . donepezil (ARICEPT) 10 MG tablet Take 10 mg by mouth at bedtime.      Marland Kitchen exemestane (AROMASIN) 25 MG tablet Take 1 tablet (25 mg total) by mouth daily.  90 tablet  3  . famotidine (PEPCID) 20 MG tablet Take 1 tablet (20 mg total) by mouth at bedtime.  90 tablet  1  . fesoterodine (TOVIAZ) 8 MG  TB24 Take 1 tablet (8 mg total) by mouth daily.  90 tablet  3  . furosemide (LASIX) 20 MG tablet as needed. Take one tablet by mouth twice daily as needed for edema      . glipiZIDE (GLIPIZIDE XL) 5 MG 24 hr tablet Take 1 tablet (5 mg total) by mouth daily.  90 tablet  3  . HYDROcodone-acetaminophen (VICODIN) 5-500 MG per tablet Take one tablet by mouth twice daily as needed for arthritis  90 tablet  0  . levothyroxine (SYNTHROID, LEVOTHROID) 150 MCG tablet Take 1 tablet (150 mcg total) by mouth daily.  90 tablet  1  . losartan (COZAAR) 50 MG tablet Take 1 tablet (50 mg total) by mouth 2 (two) times daily.  180 tablet  1  . magnesium oxide (MAG-OX) 400 MG tablet Take 400 mg by mouth daily.        . Multiple Vitamin (MULTIVITAMIN) capsule Take 1 capsule by mouth daily.        . pantoprazole (PROTONIX) 40 MG tablet Take 1 tablet (40 mg total) by mouth 2 (two) times daily.  180 tablet  1  . polyethylene glycol (MIRALAX / GLYCOLAX) packet Take 17 g by mouth as needed.        . sennosides-docusate sodium (SENOKOT-S) 8.6-50 MG tablet Take 2 tablets by mouth daily.       Marland Kitchen tiotropium (SPIRIVA) 18 MCG inhalation capsule Place 1 capsule (18 mcg total) into inhaler and inhale daily.  90 capsule  1  . traMADol (ULTRAM) 50 MG tablet 100 mg every morning, 50 mg three times daily prn pain  450 tablet  3  . venlafaxine XR (EFFEXOR-XR) 150 MG 24 hr capsule Take 1 capsule (150 mg total) by mouth daily.  90 capsule  3   No current facility-administered medications for this visit.     OBJECTIVE: elderly white woman who appears weak and tired.  Unable to step up onto exam table and examined in a chair today.   Filed Vitals:   10/17/12 1154  BP: 164/96  Pulse: 73  Temp: 97.5 F (36.4 C)  Resp: 18     Body mass index is 37.76 kg/(m^2).    ECOG FS: 2 Filed Weights   10/17/12 1154  Weight: 220 lb 1.6 oz (99.837 kg)   Sclerae unicteric Oropharynx clear, buccal mucosa pink and moist No  Cervical, supraclavicular, or axillary adenopathy palpated Lungs no wheezes or rhonchi, diminished breath sounds at both bases  Heart regular rate and rhythm, no murmur appreciated Abd obese, soft, nontender, positive bowel sounds MSK no focal spinal tenderness Neuro: nonfocal, pleasant affect, oriented Breasts: Right breast status post remote lumpectomy. There is palpable thickening beneath the lumpectomy site. There no obvious skin changes. Left breast is unremarkable.    LAB RESULTS: Lab Results  Component Value Date   WBC 9.2 10/17/2012   NEUTROABS 5.6 10/17/2012   HGB 13.4 10/17/2012   HCT 39.7 10/17/2012   MCV 99.4 10/17/2012   PLT 199 10/17/2012      Chemistry      Component Value Date/Time   NA 136 10/17/2012 1134   NA 136 05/01/2012 1137   K 5.3* 10/17/2012 1134   K 4.9 05/01/2012 1137   CL 99 05/01/2012 1137   CO2 29 10/17/2012 1134   CO2 27 05/01/2012 1137   BUN 34.7* 10/17/2012 1134   BUN 29* 05/01/2012 1137   CREATININE 1.2* 10/17/2012 1134   CREATININE 1.4* 05/01/2012 1137  Component Value Date/Time   CALCIUM 9.6 10/17/2012 1134   CALCIUM 9.5 05/01/2012 1137   ALKPHOS 93 10/17/2012 1134   ALKPHOS 102 11/06/2011 1604   AST 20 10/17/2012 1134   AST 25 11/06/2011 1604   ALT 26 10/17/2012 1134   ALT 32 11/06/2011 1604   BILITOT 0.35 10/17/2012 1134   BILITOT 0.4 11/06/2011 1604       STUDIES:  Mm Digital Diagnostic Bilat  10/14/2012   *RADIOLOGY REPORT*  Clinical Data:  right lumpectomy and radiation therapy 2006, recurrence 2013, patient currently taking  hormone modulation therapy, evaluate response to therapy  DIGITAL DIAGNOSTIC BILATERAL MAMMOGRAM WITH CAD AND RIGHT BREAST ULTRASOUND:  Comparison:  03/13/11, 03/10/10, 03/09/09 mammograms and ultrasounds, plus numerous breast MRI examinations  Findings:  ACR Breast Density Category b:  There are scattered areas of fibroglandular density.  The left breast is unchanged and negative.  On the right, there is postsurigical scarring in the far posterior upper outer quadrant, which can not be imaged in its entirety on the mammographic portion of the examination.  There is a marker clip posteriorly in the upper outer quadrant as well, indicating the site of the recurrence.  There is an approximately 1cm ovoid focus of asymmetric tissue associated with the clip.  Mammographic images were processed with CAD.  On physical exam, there is a firm 3cm mass in the right upper outer quadrant at the level of the axillary tail, corresponding to the patient's cutaneous scar, and most consistent on palpation with fibrosis.  Ultrasound is performed, showing a 3cm hypoechoic mass with shadowing at the lumpectomy site in the 9 o'clock position of the right breast, 6cm from the nipple.  There is a 6 x 6 x 5mm hypoechoic mass with associated shadowing approximately 1cm medial to the lumpectomy site in the 9 o'clock position, consistent with the recurrent focus of malignancy.  IMPRESSION: Stable to slightly smaller recurrent carcinoma near the lumpectomy site.  BI-RADS CATEGORY 6:  Known biopsy-proven malignancy - appropriate action should be taken.  RECOMMENDATION: As I discussed with the patient and her daughter, continue annual mammography.  Additionally, as we discussed, the size of the mass would be best evaluated with MRI, and they are going to discuss the pros and cons of MRI with Dr. Darnelle Catalan later this week.  I have discussed the findings and recommendations with the patient. Results were also provided in writing at the conclusion of the  visit.  If applicable, a reminder letter will be sent to the patient regarding the next appointment.   Original Report Authenticated By: Esperanza Heir, M.D.     ASSESSMENT: 79 y.o.  [place] woman,  (1) status post right lumpectomy and sentinel lymph node biopsy January of 2006 for an ER and PR positive, HER-2 negative invasive ductal carcinoma measuring 1.4 cm, grade 2, with a micrometastatic deposit in 1 out of 3 lymph nodes, status post radiation, on anastrozole between April of 2006 and April 2011  (2) now with a triple positive right-sided breast cancer, clinically T1b N0, with an Mib-1 of 45%  (3) started exemestane March 2013.  Declines surgery.  (4)  Multiple co-morbidities, including COPD, MS, history of CVA, chronic kidney disease, and diabetes.  PLAN: Carlisia continues to have positive response with the exemestane, which she is tolerating very well. We're not making any changes to her regimen at this time, I have refilled the exemestane for another year. Of course she'll continue close followup with her other physicians with regard to  her multiple comorbidities.  We'll plan on seeing Keymora back in 6 months for labs and physical exam alone. She and Rosanne Ashing both voice understanding and agreement with this plan, and will call any changes.   Trinidad Ingle    10/17/2012

## 2012-10-17 NOTE — Telephone Encounter (Signed)
appts made and printed...td 

## 2012-10-22 ENCOUNTER — Telehealth: Payer: Self-pay

## 2012-10-22 NOTE — Telephone Encounter (Signed)
Kim pts daughter left v/m wanting to know if Dr Joesphine Bare pts dentist has contacted Dr Dayton Martes re; plan of care for pt having difficulty eating; pt only has 3 teeth left. Pt is eating very soft foods. Dr Joesphine Bare was going to talk with Dr Dayton Martes about other options for pt and her eating. Please advise.

## 2012-10-22 NOTE — Telephone Encounter (Signed)
No I do not see a phone note from Dr. Joesphine Bare.

## 2012-10-23 NOTE — Telephone Encounter (Signed)
Advised patient's daughter.

## 2012-11-14 ENCOUNTER — Encounter: Payer: Self-pay | Admitting: Critical Care Medicine

## 2012-11-14 MED ORDER — BENZONATATE 200 MG PO CAPS: 200.0000 mg | ORAL_CAPSULE | Freq: Three times a day (TID) | ORAL | Status: DC | PRN

## 2012-11-18 ENCOUNTER — Ambulatory Visit (INDEPENDENT_AMBULATORY_CARE_PROVIDER_SITE_OTHER): Payer: Medicare Other | Admitting: Family Medicine

## 2012-11-18 ENCOUNTER — Encounter: Payer: Self-pay | Admitting: Family Medicine

## 2012-11-18 VITALS — BP 148/78 | HR 92 | Temp 97.8°F | Resp 22 | Ht 64.0 in

## 2012-11-18 DIAGNOSIS — J449 Chronic obstructive pulmonary disease, unspecified: Secondary | ICD-10-CM

## 2012-11-18 DIAGNOSIS — E119 Type 2 diabetes mellitus without complications: Secondary | ICD-10-CM

## 2012-11-18 DIAGNOSIS — E039 Hypothyroidism, unspecified: Secondary | ICD-10-CM

## 2012-11-18 DIAGNOSIS — C50919 Malignant neoplasm of unspecified site of unspecified female breast: Secondary | ICD-10-CM

## 2012-11-18 LAB — BASIC METABOLIC PANEL
CO2: 29 mEq/L (ref 19–32)
Chloride: 97 mEq/L (ref 96–112)
Glucose, Bld: 185 mg/dL — ABNORMAL HIGH (ref 70–99)
Potassium: 4.4 mEq/L (ref 3.5–5.1)
Sodium: 137 mEq/L (ref 135–145)

## 2012-11-18 MED ORDER — PREDNISONE 10 MG PO TABS
ORAL_TABLET | ORAL | Status: DC
Start: 2012-11-18 — End: 2012-12-06

## 2012-11-18 MED ORDER — AMOXICILLIN-POT CLAVULANATE 875-125 MG PO TABS: 1.0000 | ORAL_TABLET | Freq: Two times a day (BID) | ORAL | Status: DC

## 2012-11-18 NOTE — Patient Instructions (Signed)
Good to see you. We will call you with your lab results. Please fill your prescription for you antibiotic and steroid if you chose to do so.  Please update me.

## 2012-11-18 NOTE — Progress Notes (Signed)
Subjective:    Patient ID: Ana Nunez, female    DOB: April 10, 1929, 79 y.o.   MRN: [id number]  HPI  79 yo very pleasant female with h/o COPD, breast CA, hypothyroidism, depression, memory loss here with daughter, [name], for 3 month follow up.  COPD- omplains of increased SOB, wheezing, tightness in chest, prod cough with yellow/gray mucus x 10 days.  No fever or hemoptysis . No calf pain or edema.   She has not been using her albuterol nebs at home regularly.  She just restarted Mucinex at home.  Avelox allergic.  She and her family have had multiple recent discussions about her end of life wishes.  She is a DNR and Dr. Delford Field gave her a most form to fill out. Stopped taking her cancer treatment last month and she only wants to take medications that will improve comfort, not prolong life.     Past Medical History  Diagnosis Date  . [name]   . Fatty liver   . GERD (gastroesophageal reflux disease)   . Type II or unspecified type diabetes mellitus without mention of complication, not stated as uncontrolled   . Positive PPD   . Abnormal involuntary movements(781.0)   . Hypercholesteremia   . Hypothyroidism   . Unspecified urinary incontinence   . Vocal cord dysfunction   . Multiple sclerosis   . Hypertension   . Depression   . CAD (coronary artery disease)   . COPD (chronic obstructive pulmonary disease)   . Asthma   . Community acquired pneumonia   . Arthritis   . Adenocarcinoma, breast     Right breast  . Emphysema of lung   . Neuromuscular disorder     MS  . TIA (transient ischemic attack)   . Breast cancer      Family History  Problem Relation Age of Onset  . Coronary artery disease Father   . Heart disease Father   . Pancreatic cancer Mother   . Emphysema Mother   . Diabetes Mother   . COPD Mother   . Pancreatic cancer Sister   . Cancer Sister     pancreatic  . Lymphoma Sister   . Cancer Sister     pancreatic  . Diabetes Sister   . Thyroid cancer  Daughter   . Hypertension Sister   . Hypertension Brother   . Cancer Brother     CML  . Liver cancer Sister   . Cirrhosis Sister   . Cancer Maternal Aunt     breast  . Cancer Brother     bladder     History   Social History  . Marital Status: Married    Spouse Name: N/A    Number of Children: N/A  . Years of Education: N/A   Occupational History  . Housewife     former nurse   Social History Main Topics  . Smoking status: Former Smoker -- 1.50 packs/day for 25 years    Types: Cigarettes    Quit date: 02/07/1984  . Smokeless tobacco: Never Used  . Alcohol Use: Yes     Comment: 1-2 glaases of wine daily  . Drug Use: No  . Sexual Activity: Not on file   Other Topics Concern  . Not on file   Social History Narrative   [name] Card in [place].   Full.   Would not desire feeding tube.     Allergies  Allergen Reactions  . Avelox [Moxifloxacin Hcl In Nacl] Rash  pruritis     Outpatient Prescriptions Prior to Visit  Medication Sig Dispense Refill  . albuterol (PROVENTIL HFA) 108 (90 BASE) MCG/ACT inhaler Inhale 2 puffs into the lungs every 6 (six) hours as needed.  3 Inhaler  1  . albuterol (PROVENTIL) (2.5 MG/3ML) 0.083% nebulizer solution Take 3 mLs (2.5 mg total) by nebulization every 4 (four) hours as needed.  75 mL  3  . benzonatate (TESSALON) 200 MG capsule Take 1 capsule (200 mg total) by mouth 3 (three) times daily as needed.  90 capsule  1  . Calcium Carbonate-Vitamin D (RA CALCIUM PLUS VITAMIN D) 600-400 MG-UNIT per tablet Take 1 tablet by mouth 2 (two) times daily.        . cloNIDine (CATAPRES) 0.3 MG tablet Take 1 tablet (0.3 mg total) by mouth 2 (two) times daily.  180 tablet  3  . clopidogrel (PLAVIX) 75 MG tablet Take 1 tablet (75 mg total) by mouth daily.  90 tablet  1  . donepezil (ARICEPT) 10 MG tablet Take 10 mg by mouth at bedtime.      . famotidine (PEPCID) 20 MG tablet Take 1 tablet (20 mg total) by mouth at bedtime.  90 tablet  1  . fesoterodine  (TOVIAZ) 8 MG TB24 Take 1 tablet (8 mg total) by mouth daily.  90 tablet  3  . furosemide (LASIX) 20 MG tablet as needed. Take one tablet by mouth twice daily as needed for edema      . glipiZIDE (GLIPIZIDE XL) 5 MG 24 hr tablet Take 1 tablet (5 mg total) by mouth daily.  90 tablet  3  . HYDROcodone-acetaminophen (VICODIN) 5-500 MG per tablet Take one tablet by mouth twice daily as needed for arthritis  90 tablet  0  . levothyroxine (SYNTHROID, LEVOTHROID) 150 MCG tablet Take 1 tablet (150 mcg total) by mouth daily.  90 tablet  1  . losartan (COZAAR) 50 MG tablet Take 1 tablet (50 mg total) by mouth 2 (two) times daily.  180 tablet  1  . magnesium oxide (MAG-OX) 400 MG tablet Take 400 mg by mouth daily.        . Multiple Vitamin (MULTIVITAMIN) capsule Take 1 capsule by mouth daily.        . pantoprazole (PROTONIX) 40 MG tablet Take 1 tablet (40 mg total) by mouth 2 (two) times daily.  180 tablet  1  . polyethylene glycol (MIRALAX / GLYCOLAX) packet Take 17 g by mouth as needed.        . sennosides-docusate sodium (SENOKOT-S) 8.6-50 MG tablet Take 2 tablets by mouth daily.       Marland Kitchen tiotropium (SPIRIVA) 18 MCG inhalation capsule Place 1 capsule (18 mcg total) into inhaler and inhale daily.  90 capsule  1  . traMADol (ULTRAM) 50 MG tablet 100 mg every morning, 50 mg three times daily prn pain  450 tablet  3  . venlafaxine XR (EFFEXOR-XR) 150 MG 24 hr capsule Take 1 capsule (150 mg total) by mouth daily.  90 capsule  3  . exemestane (AROMASIN) 25 MG tablet Take 1 tablet (25 mg total) by mouth daily.  90 tablet  3   No facility-administered medications prior to visit.      Review of Systems See HPI No CP +DOE      Objective:   Physical Exam BP 148/78  Pulse 92  Temp(Src) 97.8 F (36.6 C) (Oral)  Resp 22  Ht 5\' 4"  (1.626 m)  SpO2 94%  Gen: Pleasant,   in no distress,  normal affect  ENT: No lesions,  mouth clear,  oropharynx clear, no postnasal drip  Neck: No JVD, no TMG, no  carotid bruits  Lungs: No use of accessory muscles, no dullness to percussion, coarse BS with wheezes throughout lungs bilaterally  Cardiovascular: RRR, heart sounds normal, no murmur or gallops, no peripheral edema  Abdomen: soft and NT, no HSM,  BS normal  Musculoskeletal: No deformities, no cyanosis or clubbing  Neuro: alert, non focal  Skin: Warm, no lesions or rashes     Assessment & Plan:  1. HYPOTHYROIDISM Continue synthroid.  Recheck labs. - TSH - Basic Metabolic Panel  2. DIABETES MELLITUS, TYPE II No change in rx- discussed her current medications and we are in agreement to continue her current medications. - Hemoglobin A1c - Basic Metabolic Panel  3. ADENOCARCINOMA, BREAST Stopped rx.   4. COPD Gold D Deteriorated. Advised augmentin, prednisone but she would like to think about it. Refused breathing treatment today. Did accept her flu shot.

## 2012-11-19 ENCOUNTER — Other Ambulatory Visit: Payer: Self-pay | Admitting: Family Medicine

## 2012-11-19 MED ORDER — LEVOTHYROXINE SODIUM 175 MCG PO TABS: 175.0000 ug | ORAL_TABLET | Freq: Every day | ORAL | Status: DC

## 2012-11-20 ENCOUNTER — Telehealth: Payer: Self-pay

## 2012-11-20 MED ORDER — LEVOTHYROXINE SODIUM 175 MCG PO TABS: 175.0000 ug | ORAL_TABLET | Freq: Every day | ORAL | Status: AC

## 2012-11-20 NOTE — Telephone Encounter (Signed)
Kim said when she checked with [facility] [address] was told that Levothyroxine 150 mcg was ready. Spoke with Denyse Dago at [facility] and Levothyroxine 175 mcg is ready for pick up not the 150 mcg. Luanna Cole and she said OK and requested refill sent to [facility] for levothyroxine 175 mcg. Advised done. Called spoke with Sybil at [facility] and will cancel any refills for levothyroxine 150 mcg to avoid confusion.

## 2012-11-26 ENCOUNTER — Other Ambulatory Visit: Payer: Self-pay | Admitting: *Deleted

## 2012-11-26 ENCOUNTER — Telehealth: Payer: Self-pay | Admitting: *Deleted

## 2012-11-26 MED ORDER — ALBUTEROL SULFATE (2.5 MG/3ML) 0.083% IN NEBU: 2.5000 mg | INHALATION_SOLUTION | RESPIRATORY_TRACT | Status: DC | PRN

## 2012-11-26 NOTE — Telephone Encounter (Signed)
Patient's daughter Ana Nunez)  called needing a refill on her albuterol nebulizer treatment medication.Ana Nunez stated that the patient is almost out and is using the treatment 2-3 times a day and it is helping. Kim stated that Dr. Dayton Martes is aware of patient using these treatments. Ana Nunez would like to pick this up before noon at [facility].

## 2012-11-26 NOTE — Telephone Encounter (Signed)
Refill sent to pharmacy. Patient's daughter notified by telephone.

## 2012-11-26 NOTE — Telephone Encounter (Signed)
Last filled 05/02/12 

## 2012-11-27 MED ORDER — TRAMADOL HCL 50 MG PO TABS: 50.0000 mg | ORAL_TABLET | Freq: Every day | ORAL | Status: AC

## 2012-11-27 NOTE — Telephone Encounter (Signed)
Okay #[phone]

## 2012-11-27 NOTE — Telephone Encounter (Signed)
rx faxed to pharmacy manually, [facility]

## 2012-12-06 ENCOUNTER — Telehealth: Payer: Self-pay

## 2012-12-06 ENCOUNTER — Inpatient Hospital Stay (HOSPITAL_COMMUNITY)
Admission: EM | Admit: 2012-12-06 | Discharge: 2012-12-10 | DRG: 690 | Disposition: A | Discharge status: Hospice | Payer: Medicare Other | Attending: Internal Medicine | Admitting: Internal Medicine

## 2012-12-06 ENCOUNTER — Emergency Department (HOSPITAL_COMMUNITY): Payer: Medicare Other

## 2012-12-06 ENCOUNTER — Encounter (HOSPITAL_COMMUNITY): Payer: Self-pay | Admitting: Emergency Medicine

## 2012-12-06 ENCOUNTER — Telehealth: Payer: Self-pay | Admitting: Internal Medicine

## 2012-12-06 DIAGNOSIS — R634 Abnormal weight loss: Secondary | ICD-10-CM

## 2012-12-06 DIAGNOSIS — N390 Urinary tract infection, site not specified: Secondary | ICD-10-CM

## 2012-12-06 DIAGNOSIS — I639 Cerebral infarction, unspecified: Secondary | ICD-10-CM

## 2012-12-06 DIAGNOSIS — C50919 Malignant neoplasm of unspecified site of unspecified female breast: Secondary | ICD-10-CM

## 2012-12-06 DIAGNOSIS — J383 Other diseases of vocal cords: Secondary | ICD-10-CM

## 2012-12-06 DIAGNOSIS — J45909 Unspecified asthma, uncomplicated: Secondary | ICD-10-CM | POA: Diagnosis present

## 2012-12-06 DIAGNOSIS — K7689 Other specified diseases of liver: Secondary | ICD-10-CM | POA: Diagnosis present

## 2012-12-06 DIAGNOSIS — J4489 Other specified chronic obstructive pulmonary disease: Secondary | ICD-10-CM | POA: Diagnosis present

## 2012-12-06 DIAGNOSIS — R112 Nausea with vomiting, unspecified: Secondary | ICD-10-CM | POA: Diagnosis present

## 2012-12-06 DIAGNOSIS — F3289 Other specified depressive episodes: Secondary | ICD-10-CM | POA: Diagnosis present

## 2012-12-06 DIAGNOSIS — R52 Pain, unspecified: Secondary | ICD-10-CM

## 2012-12-06 DIAGNOSIS — I2510 Atherosclerotic heart disease of native coronary artery without angina pectoris: Secondary | ICD-10-CM | POA: Diagnosis present

## 2012-12-06 DIAGNOSIS — Z808 Family history of malignant neoplasm of other organs or systems: Secondary | ICD-10-CM

## 2012-12-06 DIAGNOSIS — K2940 Chronic atrophic gastritis without bleeding: Secondary | ICD-10-CM

## 2012-12-06 DIAGNOSIS — K219 Gastro-esophageal reflux disease without esophagitis: Secondary | ICD-10-CM | POA: Diagnosis present

## 2012-12-06 DIAGNOSIS — J438 Other emphysema: Secondary | ICD-10-CM | POA: Diagnosis present

## 2012-12-06 DIAGNOSIS — Z806 Family history of leukemia: Secondary | ICD-10-CM

## 2012-12-06 DIAGNOSIS — K5900 Constipation, unspecified: Secondary | ICD-10-CM | POA: Diagnosis present

## 2012-12-06 DIAGNOSIS — E119 Type 2 diabetes mellitus without complications: Secondary | ICD-10-CM | POA: Diagnosis present

## 2012-12-06 DIAGNOSIS — Z853 Personal history of malignant neoplasm of breast: Secondary | ICD-10-CM

## 2012-12-06 DIAGNOSIS — R32 Unspecified urinary incontinence: Secondary | ICD-10-CM

## 2012-12-06 DIAGNOSIS — R259 Unspecified abnormal involuntary movements: Secondary | ICD-10-CM

## 2012-12-06 DIAGNOSIS — R531 Weakness: Secondary | ICD-10-CM

## 2012-12-06 DIAGNOSIS — Z800 Family history of malignant neoplasm of digestive organs: Secondary | ICD-10-CM

## 2012-12-06 DIAGNOSIS — G35 Multiple sclerosis: Secondary | ICD-10-CM | POA: Diagnosis present

## 2012-12-06 DIAGNOSIS — Z8673 Personal history of transient ischemic attack (TIA), and cerebral infarction without residual deficits: Secondary | ICD-10-CM

## 2012-12-06 DIAGNOSIS — E039 Hypothyroidism, unspecified: Secondary | ICD-10-CM | POA: Diagnosis present

## 2012-12-06 DIAGNOSIS — Z6841 Body Mass Index (BMI) 40.0 and over, adult: Secondary | ICD-10-CM

## 2012-12-06 DIAGNOSIS — Z807 Family history of other malignant neoplasms of lymphoid, hematopoietic and related tissues: Secondary | ICD-10-CM

## 2012-12-06 DIAGNOSIS — Z66 Do not resuscitate: Secondary | ICD-10-CM | POA: Diagnosis present

## 2012-12-06 DIAGNOSIS — Z833 Family history of diabetes mellitus: Secondary | ICD-10-CM

## 2012-12-06 DIAGNOSIS — N182 Chronic kidney disease, stage 2 (mild): Secondary | ICD-10-CM | POA: Diagnosis present

## 2012-12-06 DIAGNOSIS — N189 Chronic kidney disease, unspecified: Secondary | ICD-10-CM

## 2012-12-06 DIAGNOSIS — R413 Other amnesia: Secondary | ICD-10-CM

## 2012-12-06 DIAGNOSIS — R1319 Other dysphagia: Secondary | ICD-10-CM

## 2012-12-06 DIAGNOSIS — J449 Chronic obstructive pulmonary disease, unspecified: Secondary | ICD-10-CM

## 2012-12-06 DIAGNOSIS — Z8249 Family history of ischemic heart disease and other diseases of the circulatory system: Secondary | ICD-10-CM

## 2012-12-06 DIAGNOSIS — I10 Essential (primary) hypertension: Secondary | ICD-10-CM

## 2012-12-06 DIAGNOSIS — A498 Other bacterial infections of unspecified site: Secondary | ICD-10-CM | POA: Diagnosis present

## 2012-12-06 DIAGNOSIS — E7800 Pure hypercholesterolemia, unspecified: Secondary | ICD-10-CM | POA: Diagnosis present

## 2012-12-06 DIAGNOSIS — F0390 Unspecified dementia without behavioral disturbance: Secondary | ICD-10-CM | POA: Diagnosis present

## 2012-12-06 DIAGNOSIS — L719 Rosacea, unspecified: Secondary | ICD-10-CM

## 2012-12-06 DIAGNOSIS — I6350 Cerebral infarction due to unspecified occlusion or stenosis of unspecified cerebral artery: Secondary | ICD-10-CM

## 2012-12-06 DIAGNOSIS — Z79899 Other long term (current) drug therapy: Secondary | ICD-10-CM

## 2012-12-06 DIAGNOSIS — E669 Obesity, unspecified: Secondary | ICD-10-CM | POA: Diagnosis present

## 2012-12-06 DIAGNOSIS — M129 Arthropathy, unspecified: Secondary | ICD-10-CM | POA: Diagnosis present

## 2012-12-06 DIAGNOSIS — Z87891 Personal history of nicotine dependence: Secondary | ICD-10-CM

## 2012-12-06 DIAGNOSIS — I129 Hypertensive chronic kidney disease with stage 1 through stage 4 chronic kidney disease, or unspecified chronic kidney disease: Secondary | ICD-10-CM | POA: Diagnosis present

## 2012-12-06 DIAGNOSIS — F329 Major depressive disorder, single episode, unspecified: Secondary | ICD-10-CM | POA: Diagnosis present

## 2012-12-06 DIAGNOSIS — D126 Benign neoplasm of colon, unspecified: Secondary | ICD-10-CM

## 2012-12-06 DIAGNOSIS — Z515 Encounter for palliative care: Secondary | ICD-10-CM

## 2012-12-06 LAB — CBC WITH DIFFERENTIAL/PLATELET
Basophils Absolute: 0 10*3/uL (ref 0.0–0.1)
Basophils Relative: 0 % (ref 0–1)
Eosinophils Relative: 0 % (ref 0–5)
HCT: 44.5 % (ref 36.0–46.0)
Hemoglobin: 16.1 g/dL — ABNORMAL HIGH (ref 12.0–15.0)
MCH: 35.1 pg — ABNORMAL HIGH (ref 26.0–34.0)
MCHC: 36.2 g/dL — ABNORMAL HIGH (ref 30.0–36.0)
Monocytes Absolute: 0.9 10*3/uL (ref 0.1–1.0)
Monocytes Relative: 7 % (ref 3–12)
Neutro Abs: 9.6 10*3/uL — ABNORMAL HIGH (ref 1.7–7.7)
RDW: 12.9 % (ref 11.5–15.5)
WBC: 12 10*3/uL — ABNORMAL HIGH (ref 4.0–10.5)

## 2012-12-06 LAB — COMPREHENSIVE METABOLIC PANEL
AST: 42 U/L — ABNORMAL HIGH (ref 0–37)
Albumin: 3.6 g/dL (ref 3.5–5.2)
Alkaline Phosphatase: 123 U/L — ABNORMAL HIGH (ref 39–117)
BUN: 31 mg/dL — ABNORMAL HIGH (ref 6–23)
Calcium: 9.5 mg/dL (ref 8.4–10.5)
Creatinine, Ser: 0.92 mg/dL (ref 0.50–1.10)
GFR calc Af Amer: 66 mL/min — ABNORMAL LOW (ref >90)
Glucose, Bld: 154 mg/dL — ABNORMAL HIGH (ref 70–99)
Total Protein: 7.6 g/dL (ref 6.0–8.3)

## 2012-12-06 LAB — URINE MICROSCOPIC-ADD ON

## 2012-12-06 LAB — CBC
HCT: 46.1 % — ABNORMAL HIGH (ref 36.0–46.0)
Hemoglobin: 16.5 g/dL — ABNORMAL HIGH (ref 12.0–15.0)
MCHC: 35.8 g/dL (ref 30.0–36.0)
Platelets: 231 10*3/uL (ref 150–400)
RBC: 4.71 MIL/uL (ref 3.87–5.11)
WBC: 13.2 10*3/uL — ABNORMAL HIGH (ref 4.0–10.5)

## 2012-12-06 LAB — URINALYSIS, ROUTINE W REFLEX MICROSCOPIC
Bilirubin Urine: NEGATIVE
Glucose, UA: NEGATIVE mg/dL
Ketones, ur: 40 mg/dL — AB
Protein, ur: >300 mg/dL — AB
Urobilinogen, UA: 0.2 mg/dL (ref 0.0–1.0)
pH: 5 (ref 5.0–8.0)

## 2012-12-06 LAB — CREATININE, SERUM
Creatinine, Ser: 0.94 mg/dL (ref 0.50–1.10)
GFR calc Af Amer: 64 mL/min — ABNORMAL LOW (ref >90)
GFR calc non Af Amer: 55 mL/min — ABNORMAL LOW (ref >90)

## 2012-12-06 MED ORDER — SODIUM CHLORIDE 0.9 % IV SOLN
Freq: Once | INTRAVENOUS | Status: AC
  Administered 2012-12-06: 20:00:00 via INTRAVENOUS

## 2012-12-06 MED ORDER — DONEPEZIL HCL 10 MG PO TABS
10.0000 mg | ORAL_TABLET | Freq: Every day | ORAL | Status: DC
  Administered 2012-12-07 – 2012-12-08 (×3): 10 mg via ORAL
  Filled 2012-12-06 (×4): qty 1

## 2012-12-06 MED ORDER — ALBUTEROL SULFATE (5 MG/ML) 0.5% IN NEBU: 2.5000 mg | INHALATION_SOLUTION | RESPIRATORY_TRACT | Status: DC | PRN

## 2012-12-06 MED ORDER — HYDROCODONE-ACETAMINOPHEN 5-325 MG PO TABS
1.0000 | ORAL_TABLET | Freq: Four times a day (QID) | ORAL | Status: DC | PRN
  Administered 2012-12-08 – 2012-12-09 (×2): 1 via ORAL
  Filled 2012-12-06 (×2): qty 1

## 2012-12-06 MED ORDER — LOSARTAN POTASSIUM 50 MG PO TABS
50.0000 mg | ORAL_TABLET | Freq: Two times a day (BID) | ORAL | Status: DC
  Administered 2012-12-07 (×2): 50 mg via ORAL
  Filled 2012-12-06 (×3): qty 1

## 2012-12-06 MED ORDER — ONDANSETRON HCL 4 MG/2ML IJ SOLN
4.0000 mg | Freq: Once | INTRAMUSCULAR | Status: AC
  Administered 2012-12-06: 4 mg via INTRAVENOUS
  Filled 2012-12-06: qty 2

## 2012-12-06 MED ORDER — SODIUM CHLORIDE 0.9 % IJ SOLN
3.0000 mL | Freq: Two times a day (BID) | INTRAMUSCULAR | Status: DC
  Administered 2012-12-06 – 2012-12-09 (×7): 3 mL via INTRAVENOUS

## 2012-12-06 MED ORDER — ONDANSETRON HCL 4 MG/2ML IJ SOLN
4.0000 mg | Freq: Four times a day (QID) | INTRAMUSCULAR | Status: DC | PRN
  Administered 2012-12-06 – 2012-12-07 (×2): 4 mg via INTRAVENOUS
  Filled 2012-12-06 (×2): qty 2

## 2012-12-06 MED ORDER — LEVOTHYROXINE SODIUM 175 MCG PO TABS
175.0000 ug | ORAL_TABLET | Freq: Every day | ORAL | Status: DC
  Administered 2012-12-07 – 2012-12-10 (×4): 175 ug via ORAL
  Filled 2012-12-06 (×5): qty 1

## 2012-12-06 MED ORDER — PANTOPRAZOLE SODIUM 40 MG PO TBEC
40.0000 mg | DELAYED_RELEASE_TABLET | Freq: Two times a day (BID) | ORAL | Status: DC
  Administered 2012-12-07 – 2012-12-10 (×6): 40 mg via ORAL
  Filled 2012-12-06 (×7): qty 1

## 2012-12-06 MED ORDER — DEXTROSE 5 % IV SOLN
1.0000 g | Freq: Once | INTRAVENOUS | Status: AC
  Administered 2012-12-06: 1 g via INTRAVENOUS
  Filled 2012-12-06: qty 10

## 2012-12-06 MED ORDER — VENLAFAXINE HCL ER 150 MG PO CP24
150.0000 mg | ORAL_CAPSULE | Freq: Every day | ORAL | Status: DC
  Administered 2012-12-07 – 2012-12-10 (×4): 150 mg via ORAL
  Filled 2012-12-06 (×4): qty 1

## 2012-12-06 MED ORDER — MORPHINE SULFATE 4 MG/ML IJ SOLN
4.0000 mg | INTRAMUSCULAR | Status: DC | PRN
  Administered 2012-12-06: 4 mg via INTRAVENOUS
  Filled 2012-12-06: qty 1

## 2012-12-06 MED ORDER — CLONIDINE HCL 0.3 MG/24HR TD PTWK
0.3000 mg | MEDICATED_PATCH | TRANSDERMAL | Status: DC
  Administered 2012-12-06: 0.3 mg via TRANSDERMAL
  Filled 2012-12-06: qty 1

## 2012-12-06 MED ORDER — SENNOSIDES-DOCUSATE SODIUM 8.6-50 MG PO TABS
2.0000 | ORAL_TABLET | Freq: Every day | ORAL | Status: DC
  Administered 2012-12-07 – 2012-12-08 (×3): 2 via ORAL
  Filled 2012-12-06 (×4): qty 2

## 2012-12-06 MED ORDER — CLOPIDOGREL BISULFATE 75 MG PO TABS
75.0000 mg | ORAL_TABLET | Freq: Every day | ORAL | Status: DC
  Administered 2012-12-07 – 2012-12-09 (×3): 75 mg via ORAL
  Filled 2012-12-06 (×4): qty 1

## 2012-12-06 MED ORDER — POLYETHYLENE GLYCOL 3350 17 G PO PACK
17.0000 g | PACK | ORAL | Status: DC | PRN
  Filled 2012-12-06: qty 1

## 2012-12-06 MED ORDER — ONDANSETRON HCL 4 MG PO TABS: 4.0000 mg | ORAL_TABLET | Freq: Four times a day (QID) | ORAL | Status: DC | PRN

## 2012-12-06 MED ORDER — IOHEXOL 300 MG/ML  SOLN: 25.0000 mL | INTRAMUSCULAR | Status: AC

## 2012-12-06 MED ORDER — ENOXAPARIN SODIUM 40 MG/0.4ML ~~LOC~~ SOLN
40.0000 mg | SUBCUTANEOUS | Status: DC
  Administered 2012-12-06 – 2012-12-08 (×3): 40 mg via SUBCUTANEOUS
  Filled 2012-12-06 (×5): qty 0.4

## 2012-12-06 MED ORDER — LABETALOL HCL 5 MG/ML IV SOLN
20.0000 mg | Freq: Once | INTRAVENOUS | Status: AC
  Administered 2012-12-06: 20 mg via INTRAVENOUS
  Filled 2012-12-06: qty 4

## 2012-12-06 MED ORDER — METOCLOPRAMIDE HCL 5 MG/ML IJ SOLN
5.0000 mg | Freq: Once | INTRAMUSCULAR | Status: AC
  Administered 2012-12-06: 5 mg via INTRAVENOUS
  Filled 2012-12-06: qty 2

## 2012-12-06 MED ORDER — BENZONATATE 100 MG PO CAPS
200.0000 mg | ORAL_CAPSULE | Freq: Three times a day (TID) | ORAL | Status: DC | PRN
  Filled 2012-12-06: qty 2

## 2012-12-06 MED ORDER — MORPHINE SULFATE 2 MG/ML IJ SOLN
1.0000 mg | INTRAMUSCULAR | Status: DC | PRN
  Administered 2012-12-09 – 2012-12-10 (×5): 1 mg via INTRAVENOUS
  Filled 2012-12-06 (×5): qty 1

## 2012-12-06 MED ORDER — TIOTROPIUM BROMIDE MONOHYDRATE 18 MCG IN CAPS
18.0000 ug | ORAL_CAPSULE | Freq: Every day | RESPIRATORY_TRACT | Status: DC
  Administered 2012-12-07 – 2012-12-10 (×4): 18 ug via RESPIRATORY_TRACT
  Filled 2012-12-06: qty 5

## 2012-12-06 MED ORDER — ALBUTEROL SULFATE HFA 108 (90 BASE) MCG/ACT IN AERS
2.0000 | INHALATION_SPRAY | Freq: Four times a day (QID) | RESPIRATORY_TRACT | Status: DC | PRN
  Filled 2012-12-06: qty 6.7

## 2012-12-06 MED ORDER — SENNA-DOCUSATE SODIUM 8.6-50 MG PO TABS: 2.0000 | ORAL_TABLET | Freq: Every day | ORAL | Status: DC

## 2012-12-06 NOTE — ED Notes (Signed)
Pt to ct 

## 2012-12-06 NOTE — Telephone Encounter (Signed)
I spoke with the patient's daughter she is having her mother transported to [facility] via ambulance.  Patient is terrible pain from the abdomen with fever, vomiting, diarrhea.  She is a DNR and has been declining for some time.  Pain is getting worse.  She wanted the practice to be aware.  Her primary is Dr. Dayton Martes and she has also been seeing pulmonary.  Patient has not been seen by GI in several years.  Jennye Moccasin, PA has been notified.

## 2012-12-06 NOTE — Progress Notes (Signed)
Danielle RN called unit to give report

## 2012-12-06 NOTE — ED Notes (Signed)
Admitting at bedside 

## 2012-12-06 NOTE — Progress Notes (Signed)
Tried calling Ed to get report and NS is trying to get RN on the and no one was answering the phone or at Nursing station.

## 2012-12-06 NOTE — ED Provider Notes (Signed)
CSN: [id number]     Arrival date & time 12/06/12  1146 History   First MD Initiated Contact with Patient 12/06/12 1204     Chief Complaint  Patient presents with  . Abdominal Pain    HPI  Patient is here with her husband and daughter. Patient is retired Engineer, civil (consulting). Daughter is a Engineer, civil (consulting). Apparently she has had pulmonary disease for quite some time. She was diagnosed with a bronchitis about 7-10 days ago and elected to not be treated with steroids or antibiotics. Per the family they thought that this might be a terminal event but she has improved. However she is began complaining of some abdominal pain. She's had nausea for over a week. Vomiting intermittently but more consistently in the last 3 days. She's had diverticulosis for years and episode of diverticulitis previously. The family's wishes are for an investigation into this cause of her pain, and recommendations regarding treatment versus comfort measures versus both.  She's been constipated for several days which is not unusual with her MS.  She is incontinent of urine at baseline which is also consistent with her MS.  Past Medical History  Diagnosis Date  . [name]   . Fatty liver   . GERD (gastroesophageal reflux disease)   . Type II or unspecified type diabetes mellitus without mention of complication, not stated as uncontrolled   . Positive PPD   . Abnormal involuntary movements(781.0)   . Hypercholesteremia   . Hypothyroidism   . Unspecified urinary incontinence   . Vocal cord dysfunction   . Multiple sclerosis   . Hypertension   . Depression   . CAD (coronary artery disease)   . COPD (chronic obstructive pulmonary disease)   . Asthma   . Community acquired pneumonia   . Arthritis   . Adenocarcinoma, breast     Right breast  . Emphysema of lung   . Neuromuscular disorder     MS  . TIA (transient ischemic attack)   . Breast cancer    Past Surgical History  Procedure Laterality Date  . Appendectomy  1942  . Tonsillectomy   1951  . Dilation and curettage of uterus  1964  . Nasal sinus surgery  1999  . Breast lumpectomy  1/06    Right  . Fracture surgery  may 2010    R ankle    Family History  Problem Relation Age of Onset  . Coronary artery disease Father   . Heart disease Father   . Pancreatic cancer Mother   . Emphysema Mother   . Diabetes Mother   . COPD Mother   . Pancreatic cancer Sister   . Cancer Sister     pancreatic  . Lymphoma Sister   . Cancer Sister     pancreatic  . Diabetes Sister   . Thyroid cancer Daughter   . Hypertension Sister   . Hypertension Brother   . Cancer Brother     CML  . Liver cancer Sister   . Cirrhosis Sister   . Cancer Maternal Aunt     breast  . Cancer Brother     bladder   History  Substance Use Topics  . Smoking status: Former Smoker -- 1.50 packs/day for 25 years    Types: Cigarettes    Quit date: 02/07/1984  . Smokeless tobacco: Never Used  . Alcohol Use: Yes     Comment: 1-2 glaases of wine daily   OB History   Grav Para Term Preterm Abortions TAB SAB Ect  Mult Living                 Review of Systems  Constitutional: Negative for fever, chills, diaphoresis, appetite change and fatigue.  HENT: Negative for mouth sores, sore throat and trouble swallowing.   Eyes: Negative for visual disturbance.  Respiratory: Negative for cough, chest tightness, shortness of breath and wheezing.   Cardiovascular: Negative for chest pain.  Gastrointestinal: Positive for nausea, vomiting, abdominal pain and constipation. Negative for diarrhea, blood in stool, abdominal distention, anal bleeding and rectal pain.  Endocrine: Negative for polydipsia, polyphagia and polyuria.  Genitourinary: Negative for dysuria, frequency and hematuria.  Musculoskeletal: Negative for gait problem.  Skin: Negative for color change, pallor and rash.  Neurological: Positive for weakness. Negative for dizziness, syncope, light-headedness and headaches.  Hematological: Does not  bruise/bleed easily.  Psychiatric/Behavioral: Negative for behavioral problems and confusion.    Allergies  Avelox  Home Medications   Current Outpatient Rx  Name  Route  Sig  Dispense  Refill  . albuterol (PROVENTIL HFA;VENTOLIN HFA) 108 (90 BASE) MCG/ACT inhaler   Inhalation   Inhale 2 puffs into the lungs every 6 (six) hours as needed for wheezing or shortness of breath.         Marland Kitchen albuterol (PROVENTIL) (2.5 MG/3ML) 0.083% nebulizer solution   Nebulization   Take 2.5 mg by nebulization every 6 (six) hours as needed for wheezing or shortness of breath.         . benzonatate (TESSALON) 200 MG capsule   Oral   Take 200 mg by mouth 3 (three) times daily as needed for cough.         . Calcium Carbonate-Vitamin D (RA CALCIUM PLUS VITAMIN D) 600-400 MG-UNIT per tablet   Oral   Take 1 tablet by mouth 2 (two) times daily.           . cloNIDine (CATAPRES) 0.3 MG tablet   Oral   Take 1 tablet (0.3 mg total) by mouth 2 (two) times daily.   180 tablet   3   . clopidogrel (PLAVIX) 75 MG tablet   Oral   Take 1 tablet (75 mg total) by mouth daily.   90 tablet   1   . donepezil (ARICEPT) 10 MG tablet   Oral   Take 10 mg by mouth at bedtime.         . famotidine (PEPCID) 20 MG tablet   Oral   Take 1 tablet (20 mg total) by mouth at bedtime.   90 tablet   1   . fesoterodine (TOVIAZ) 8 MG TB24   Oral   Take 1 tablet (8 mg total) by mouth daily.   90 tablet   3   . glipiZIDE (GLIPIZIDE XL) 5 MG 24 hr tablet   Oral   Take 1 tablet (5 mg total) by mouth daily.   90 tablet   3   . HYDROcodone-acetaminophen (NORCO/VICODIN) 5-325 MG per tablet   Oral   Take 1 tablet by mouth every 6 (six) hours as needed for pain.         Marland Kitchen levothyroxine (SYNTHROID, LEVOTHROID) 175 MCG tablet   Oral   Take 1 tablet (175 mcg total) by mouth daily before breakfast.   90 tablet   0   . losartan (COZAAR) 50 MG tablet   Oral   Take 1 tablet (50 mg total) by mouth 2 (two) times  daily.   180 tablet   1   .  magnesium oxide (MAG-OX) 400 MG tablet   Oral   Take 400 mg by mouth daily.           . Multiple Vitamin (MULTIVITAMIN WITH MINERALS) TABS tablet   Oral   Take 1 tablet by mouth daily.         . ondansetron (ZOFRAN) 8 MG tablet   Oral   Take 8 mg by mouth every 6 (six) hours as needed for nausea.         . pantoprazole (PROTONIX) 40 MG tablet   Oral   Take 1 tablet (40 mg total) by mouth 2 (two) times daily.   180 tablet   1   . polyethylene glycol (MIRALAX / GLYCOLAX) packet   Oral   Take 17 g by mouth as needed (constipation).          . sennosides-docusate sodium (SENOKOT-S) 8.6-50 MG tablet   Oral   Take 2 tablets by mouth daily.          Marland Kitchen tiotropium (SPIRIVA) 18 MCG inhalation capsule   Inhalation   Place 1 capsule (18 mcg total) into inhaler and inhale daily.   90 capsule   1   . traMADol (ULTRAM) 50 MG tablet   Oral   Take 1 tablet (50 mg total) by mouth 5 (five) times daily.   450 tablet   0   . venlafaxine XR (EFFEXOR-XR) 150 MG 24 hr capsule   Oral   Take 1 capsule (150 mg total) by mouth daily.   90 capsule   3    BP 200/99  Pulse 97  Resp 20  SpO2 90% Physical Exam  Constitutional: She is oriented to person, place, and time. She appears well-developed and well-nourished. No distress.  Awake obese no frank distress  HENT:  Head: Normocephalic.  Eyes: Conjunctivae are normal. Pupils are equal, round, and reactive to light. No scleral icterus.  Neck: Normal range of motion. Neck supple. No thyromegaly present.  Cardiovascular: Normal rate and regular rhythm.  Exam reveals no gallop and no friction rub.   No murmur heard. Pulmonary/Chest: Effort normal and breath sounds normal. No respiratory distress. She has no wheezes. She has no rales.  Sounds clear and equal.  Abdominal: Soft. Bowel sounds are normal. She exhibits no distension. There is tenderness in the left upper quadrant and left lower quadrant.  There is no rigidity, no rebound and no guarding.  Musculoskeletal: Normal range of motion.  Neurological: She is alert and oriented to person, place, and time.  Skin: Skin is warm and dry. No rash noted.  Psychiatric: She has a normal mood and affect. Her behavior is normal.    ED Course  Procedures (including critical care time) Labs Review Labs Reviewed  URINALYSIS, ROUTINE W REFLEX MICROSCOPIC - Abnormal; Notable for the following:    APPearance CLOUDY (*)    Hgb urine dipstick LARGE (*)    Ketones, ur 40 (*)    Protein, ur >300 (*)    Nitrite POSITIVE (*)    Leukocytes, UA TRACE (*)    All other components within normal limits  CBC WITH DIFFERENTIAL - Abnormal; Notable for the following:    WBC 12.0 (*)    Hemoglobin 16.1 (*)    MCH 35.1 (*)    MCHC 36.2 (*)    Neutrophils Relative % 80 (*)    Neutro Abs 9.6 (*)    All other components within normal limits  COMPREHENSIVE METABOLIC PANEL - Abnormal; Notable for  the following:    Glucose, Bld 154 (*)    BUN 31 (*)    AST 42 (*)    ALT 41 (*)    Alkaline Phosphatase 123 (*)    GFR calc non Af Amer 57 (*)    GFR calc Af Amer 66 (*)    All other components within normal limits  URINE MICROSCOPIC-ADD ON - Abnormal; Notable for the following:    Bacteria, UA MANY (*)    All other components within normal limits  URINE CULTURE  CULTURE, BLOOD (ROUTINE X 2)  CULTURE, BLOOD (ROUTINE X 2)  LIPASE, BLOOD   Imaging Review Ct Abdomen Pelvis Wo Contrast  12/06/2012   CLINICAL DATA:  Increasing abdominal pain. Nausea for the past week. Diabetic patient with history of breast cancer. Post appendectomy.  EXAM: CT ABDOMEN AND PELVIS WITHOUT CONTRAST  TECHNIQUE: Multidetector CT imaging of the abdomen and pelvis was performed following the standard protocol without intravenous contrast.  COMPARISON:  07/23/2002.  FINDINGS: Stool filled right colon. No extra luminal bowel inflammatory process free fluid or free air.  Mild diastases  rectus muscles without bowel containing the hernia.  8 cm left renal cysts minimally changed from 2004. Slight haziness of fat planes surrounding the kidneys noted previously without findings of obstruction.  Question gallstones.  Taking into account limitation by non contrast imaging, no worrisome hepatic, splenic, pancreatic, renal or adrenal lesion. Hyperplasia of the adrenal glands.  Atherosclerotic type changes including coronary artery calcification and calcification with mild ectasia abdominal aorta are and branch vessels of the aorta.  Scoliosis and degenerative changes lower lumbar spine without worrisome osseous lesion.  IMPRESSION: No acute inflammatory process detected. Please see above.   Electronically Signed   By: Bridgett Larsson M.D.   On: 12/06/2012 15:55   Dg Chest Port 1 View  12/06/2012   CLINICAL DATA:  Nausea and vomiting  EXAM: PORTABLE CHEST - 1 VIEW  COMPARISON:  11/02/2011  FINDINGS: Cardiac shadow is within normal limits. The thoracic aorta is mildly tortuous. Atherosclerotic calcifications are noted. The lungs are well aerated bilaterally without focal infiltrate or sizable effusion.  IMPRESSION: No acute abnormality noted.   Electronically Signed   By: Alcide Clever M.D.   On: 12/06/2012 16:33    EKG Interpretation   None       MDM   1. Urinary tract infection    I discussed the findings with the patient, her husband, and her daughter. CT does not show acute abnormalities. Renal function sodium did not show any abnormality. Her urine does appear infected. She still feels poorly. She feels weak. She is still nauseated. Her blood pressure remains high. I just started her on IV hydralazine and a Catapres patch. Long discussion between the family and myself. They were considering hospice/palliative therapy, because of her MS. I do not feel that this is imminent. I have offered them symptomatic treatment and admission, attempt at home treatment. She states that she still feels  poorly and her, and her family's preference would be for her to be admitted I think this is quite reasonable. She is still unable to take by mouth and has an active urinary tract infection. I've asked for blood cultures. Has asked for IV Rocephin considering her quinolone allergy. I placed a call to the hospitalist.    Roney Marion, MD 12/06/12 1705

## 2012-12-06 NOTE — Telephone Encounter (Signed)
Katie at [facility] left v/m that pt was sent to [facility] due to increased abd pain and N& V all week. Pt is presently at [facility]. Katie did not require cb.

## 2012-12-06 NOTE — H&P (Signed)
[facility] History and Physical  Ana Nunez YNW:[id number] DOB: 1929-08-19 DOA: 12/06/2012  Referring physician: Dr Fayrene Fearing PCP: [name], MD  Specialists: Dr Lowell Guitar.  Chief Complaint: nasuea, vomiting and abdominal pain since one week.   HPI: Ana Nunez is a 79 y.o. female with prior h/o of MS, type 2 DM, hypothyroidism, hypertension, Depression, Dementia, was brought in by her daughter via ambulance for generalized weakness, nausea, vomiting, and abdominal pain since one week. On arrival she was found to have an UTI. Her labs revealed elevated white blood count and elevated hematocrit. CT abdomen and pelvis did nt reveal any acute pathology. She is referred to medical service for admission.   Review of Systems: could not be obtained as pt is sleepy and sedated from the morphine injection.   Past Medical History  Diagnosis Date  . [name]   . Fatty liver   . GERD (gastroesophageal reflux disease)   . Type II or unspecified type diabetes mellitus without mention of complication, not stated as uncontrolled   . Positive PPD   . Abnormal involuntary movements(781.0)   . Hypercholesteremia   . Hypothyroidism   . Unspecified urinary incontinence   . Vocal cord dysfunction   . Multiple sclerosis   . Hypertension   . Depression   . CAD (coronary artery disease)   . COPD (chronic obstructive pulmonary disease)   . Asthma   . Community acquired pneumonia   . Arthritis   . Adenocarcinoma, breast     Right breast  . Emphysema of lung   . Neuromuscular disorder     MS  . TIA (transient ischemic attack)   . Breast cancer    Past Surgical History  Procedure Laterality Date  . Appendectomy  1942  . Tonsillectomy  1951  . Dilation and curettage of uterus  1964  . Nasal sinus surgery  1999  . Breast lumpectomy  1/06    Right  . Fracture surgery  may 2010    R ankle    Social History:  reports that she quit smoking about 28 years ago. Her smoking use included Cigarettes.  She has a 37.5 pack-year smoking history. She has never used smokeless tobacco. She reports that she drinks alcohol. She reports that she does not use illicit drugs.  where does patient live--home,  Allergies  Allergen Reactions  . Avelox [Moxifloxacin Hcl In Nacl] Rash    pruritis    Family History  Problem Relation Age of Onset  . Coronary artery disease Father   . Heart disease Father   . Pancreatic cancer Mother   . Emphysema Mother   . Diabetes Mother   . COPD Mother   . Pancreatic cancer Sister   . Cancer Sister     pancreatic  . Lymphoma Sister   . Cancer Sister     pancreatic  . Diabetes Sister   . Thyroid cancer Daughter   . Hypertension Sister   . Hypertension Brother   . Cancer Brother     CML  . Liver cancer Sister   . Cirrhosis Sister   . Cancer Maternal Aunt     breast  . Cancer Brother     bladder    Prior to Admission medications   Medication Sig Start Date End Date Taking? Authorizing Provider  albuterol (PROVENTIL HFA;VENTOLIN HFA) 108 (90 BASE) MCG/ACT inhaler Inhale 2 puffs into the lungs every 6 (six) hours as needed for wheezing or shortness of breath.   Yes Historical  Provider, MD  albuterol (PROVENTIL) (2.5 MG/3ML) 0.083% nebulizer solution Take 2.5 mg by nebulization every 6 (six) hours as needed for wheezing or shortness of breath.   Yes Historical Provider, MD  benzonatate (TESSALON) 200 MG capsule Take 200 mg by mouth 3 (three) times daily as needed for cough.   Yes Historical Provider, MD  Calcium Carbonate-Vitamin D (RA CALCIUM PLUS VITAMIN D) 600-400 MG-UNIT per tablet Take 1 tablet by mouth 2 (two) times daily.     Yes Historical Provider, MD  cloNIDine (CATAPRES) 0.3 MG tablet Take 1 tablet (0.3 mg total) by mouth 2 (two) times daily. 04/18/12  Yes Dianne Dun, MD  clopidogrel (PLAVIX) 75 MG tablet Take 1 tablet (75 mg total) by mouth daily. 08/16/12  Yes Dianne Dun, MD  donepezil (ARICEPT) 10 MG tablet Take 10 mg by mouth at bedtime.  05/01/12  Yes Dianne Dun, MD  famotidine (PEPCID) 20 MG tablet Take 1 tablet (20 mg total) by mouth at bedtime. 08/23/12  Yes Dianne Dun, MD  fesoterodine (TOVIAZ) 8 MG TB24 Take 1 tablet (8 mg total) by mouth daily. 04/18/12  Yes Dianne Dun, MD  glipiZIDE (GLIPIZIDE XL) 5 MG 24 hr tablet Take 1 tablet (5 mg total) by mouth daily. 04/18/12  Yes Dianne Dun, MD  HYDROcodone-acetaminophen (NORCO/VICODIN) 5-325 MG per tablet Take 1 tablet by mouth every 6 (six) hours as needed for pain.   Yes Historical Provider, MD  levothyroxine (SYNTHROID, LEVOTHROID) 175 MCG tablet Take 1 tablet (175 mcg total) by mouth daily before breakfast. 11/20/12  Yes Dianne Dun, MD  losartan (COZAAR) 50 MG tablet Take 1 tablet (50 mg total) by mouth 2 (two) times daily. 08/22/12  Yes Dianne Dun, MD  magnesium oxide (MAG-OX) 400 MG tablet Take 400 mg by mouth daily.     Yes Historical Provider, MD  Multiple Vitamin (MULTIVITAMIN WITH MINERALS) TABS tablet Take 1 tablet by mouth daily.   Yes Historical Provider, MD  ondansetron (ZOFRAN) 8 MG tablet Take 8 mg by mouth every 6 (six) hours as needed for nausea.   Yes Historical Provider, MD  pantoprazole (PROTONIX) 40 MG tablet Take 1 tablet (40 mg total) by mouth 2 (two) times daily. 08/23/12 08/23/13 Yes Storm Frisk, MD  polyethylene glycol [facility]) packet Take 17 g by mouth as needed (constipation).    Yes Historical Provider, MD  sennosides-docusate sodium (SENOKOT-S) 8.6-50 MG tablet Take 2 tablets by mouth daily.    Yes Historical Provider, MD  tiotropium (SPIRIVA) 18 MCG inhalation capsule Place 1 capsule (18 mcg total) into inhaler and inhale daily. 03/10/11  Yes Storm Frisk, MD  traMADol (ULTRAM) 50 MG tablet Take 1 tablet (50 mg total) by mouth 5 (five) times daily. 11/26/12  Yes Karie Schwalbe, MD  venlafaxine XR (EFFEXOR-XR) 150 MG 24 hr capsule Take 1 capsule (150 mg total) by mouth daily. 04/18/12  Yes Dianne Dun, MD   Physical  Exam: Filed Vitals:   12/06/12 1737  BP: 187/86  Pulse:   Resp:     Constitutional: Vital signs reviewed.  Patient is a well-developed and well-nourished  in no acute distress and cooperative with exam very sleepy  Head: Normocephalic and atraumatic Mouth: no erythema or exudates, MMM Eyes: PERRL, EOMI, conjunctivae normal, No scleral icterus.  Neck: Supple, Trachea midline normal ROM, No JVD, mass, thyromegaly, or carotid bruit present.  Cardiovascular: RRR, S1 normal, S2 normal, no MRG, pulses symmetric  and intact bilaterally Pulmonary/Chest: normal respiratory effort, CTAB, no wheezes, rales, or rhonchi Abdominal: Soft. Mild generalized tenderness, non-distended, bowel sounds are normal, no masses, organomegaly, or guarding present.  Musculoskeletal: No joint deformities, erythema, or stiffness, ROM full and no nontender  Neurological: sedated after the morphine injection.  Speech normal, could not perform a neurological exam as she is sedated from the morphine injection.  Skin: Warm, dry and intact. No rash, cyanosis, or clubbing.      Labs on Admission:  Basic Metabolic Panel:  Recent Labs Lab 12/06/12 1304  NA 137  K 3.6  CL 99  CO2 23  GLUCOSE 154*  BUN 31*  CREATININE 0.92  CALCIUM 9.5   Liver Function Tests:  Recent Labs Lab 12/06/12 1304  AST 42*  ALT 41*  ALKPHOS 123*  BILITOT 0.6  PROT 7.6  ALBUMIN 3.6    Recent Labs Lab 12/06/12 1304  LIPASE 30   No results found for this basename: AMMONIA,  in the last 168 hours CBC:  Recent Labs Lab 12/06/12 1304  WBC 12.0*  NEUTROABS 9.6*  HGB 16.1*  HCT 44.5  MCV 96.9  PLT 221   Cardiac Enzymes: No results found for this basename: CKTOTAL, CKMB, CKMBINDEX, TROPONINI,  in the last 168 hours  BNP (last 3 results) No results found for this basename: PROBNP,  in the last 8760 hours CBG: No results found for this basename: GLUCAP,  in the last 168 hours  Radiological Exams on Admission: Ct  Abdomen Pelvis Wo Contrast  12/06/2012   CLINICAL DATA:  Increasing abdominal pain. Nausea for the past week. Diabetic patient with history of breast cancer. Post appendectomy.  EXAM: CT ABDOMEN AND PELVIS WITHOUT CONTRAST  TECHNIQUE: Multidetector CT imaging of the abdomen and pelvis was performed following the standard protocol without intravenous contrast.  COMPARISON:  07/23/2002.  FINDINGS: Stool filled right colon. No extra luminal bowel inflammatory process free fluid or free air.  Mild diastases rectus muscles without bowel containing the hernia.  8 cm left renal cysts minimally changed from 2004. Slight haziness of fat planes surrounding the kidneys noted previously without findings of obstruction.  Question gallstones.  Taking into account limitation by non contrast imaging, no worrisome hepatic, splenic, pancreatic, renal or adrenal lesion. Hyperplasia of the adrenal glands.  Atherosclerotic type changes including coronary artery calcification and calcification with mild ectasia abdominal aorta are and branch vessels of the aorta.  Scoliosis and degenerative changes lower lumbar spine without worrisome osseous lesion.  IMPRESSION: No acute inflammatory process detected. Please see above.   Electronically Signed   By: Bridgett Larsson M.D.   On: 12/06/2012 15:55   Dg Chest Port 1 View  12/06/2012   CLINICAL DATA:  Nausea and vomiting  EXAM: PORTABLE CHEST - 1 VIEW  COMPARISON:  11/02/2011  FINDINGS: Cardiac shadow is within normal limits. The thoracic aorta is mildly tortuous. Atherosclerotic calcifications are noted. The lungs are well aerated bilaterally without focal infiltrate or sizable effusion.  IMPRESSION: No acute abnormality noted.   Electronically Signed   By: Alcide Clever M.D.   On: 12/06/2012 16:33      Assessment/Plan 1. UTI:  - ADMIT to medical floor - started on rocephin - urine and blood cultures are sent and pending.  - on foley   2. MS: - Resume home medications.   3.  Accelerated Hypertension: - resume home medications.  - prn hydralazine  - asymptomatic  4. ckd stage2:  - better than baseline.  5. Dm: - hold oral meds - start SSI  6. Cva: - resume plavix  7. COPD: - No wheezing heard - resume home nebs, spiriva and albuterol  DVT prophylaxis.  Code Status: presumed full code Family Communication: discussed with the daughter  Disposition Plan:  Pending PT EVAL.   Time spent: 8 MIN  Layman Gully [facility] Pager [phone]  If 7PM-7AM, please contact night-coverage [email or web address] Password [facility] 12/06/2012, 6:45 PM

## 2012-12-06 NOTE — ED Notes (Signed)
Pt arrives by [facility] from [facility]. EMS reports pt has had increasingly painful abdominal pain. Pt also reports she has been nauseated x1 week with decreased food and water intake. Pt has hx of pulmonary issues and was recently dx with bronchitis. Pt family reports that she does not want to proceed with invasive procedures and does not want antibiotics. Pt plans on going on comfort care and hospice care.  Pt also has bp of 212/88 and has not been wanting to take her medications.

## 2012-12-07 DIAGNOSIS — N390 Urinary tract infection, site not specified: Secondary | ICD-10-CM | POA: Diagnosis present

## 2012-12-07 DIAGNOSIS — R112 Nausea with vomiting, unspecified: Secondary | ICD-10-CM | POA: Diagnosis present

## 2012-12-07 DIAGNOSIS — G35 Multiple sclerosis: Secondary | ICD-10-CM

## 2012-12-07 DIAGNOSIS — F329 Major depressive disorder, single episode, unspecified: Secondary | ICD-10-CM

## 2012-12-07 LAB — CBC
Hemoglobin: 16.8 g/dL — ABNORMAL HIGH (ref 12.0–15.0)
MCH: 34.6 pg — ABNORMAL HIGH (ref 26.0–34.0)
MCHC: 34.9 g/dL (ref 30.0–36.0)
RDW: 13.3 % (ref 11.5–15.5)
WBC: 12.6 10*3/uL — ABNORMAL HIGH (ref 4.0–10.5)

## 2012-12-07 LAB — BASIC METABOLIC PANEL
BUN: 37 mg/dL — ABNORMAL HIGH (ref 6–23)
Calcium: 9.3 mg/dL (ref 8.4–10.5)
Chloride: 102 mEq/L (ref 96–112)
GFR calc Af Amer: 58 mL/min — ABNORMAL LOW (ref >90)
GFR calc non Af Amer: 50 mL/min — ABNORMAL LOW (ref >90)
Potassium: 3.7 mEq/L (ref 3.5–5.1)

## 2012-12-07 MED ORDER — PROMETHAZINE HCL 25 MG/ML IJ SOLN
12.5000 mg | Freq: Four times a day (QID) | INTRAMUSCULAR | Status: DC | PRN
  Administered 2012-12-07: 13:00:00 12.5 mg via INTRAVENOUS
  Filled 2012-12-07: qty 1

## 2012-12-07 MED ORDER — FUROSEMIDE 10 MG/ML IJ SOLN
40.0000 mg | Freq: Once | INTRAMUSCULAR | Status: AC
  Administered 2012-12-07: 11:00:00 40 mg via INTRAVENOUS
  Filled 2012-12-07: qty 4

## 2012-12-07 MED ORDER — LOSARTAN POTASSIUM 50 MG PO TABS
100.0000 mg | ORAL_TABLET | Freq: Two times a day (BID) | ORAL | Status: DC
  Administered 2012-12-07 – 2012-12-10 (×6): 100 mg via ORAL
  Filled 2012-12-07 (×7): qty 2

## 2012-12-07 MED ORDER — LABETALOL HCL 5 MG/ML IV SOLN
20.0000 mg | Freq: Once | INTRAVENOUS | Status: AC
  Administered 2012-12-07: 20 mg via INTRAVENOUS
  Filled 2012-12-07: qty 4

## 2012-12-07 MED ORDER — POTASSIUM CHLORIDE CRYS ER 20 MEQ PO TBCR
40.0000 meq | EXTENDED_RELEASE_TABLET | Freq: Once | ORAL | Status: AC
  Administered 2012-12-07: 40 meq via ORAL
  Filled 2012-12-07: qty 2

## 2012-12-07 NOTE — Progress Notes (Signed)
[facility] PROGRESS NOTE  Ana Nunez:[id number] DOB: 25-Feb-1929 DOA: 12/06/2012 PCP: [name], MD  HPI/Subjective: Feels much better, nausea, vomiting abdominal pain improved since yesterday.  Assessment/Plan: Principal Problem:   UTI (urinary tract infection) Active Problems:   HYPOTHYROIDISM   DIABETES MELLITUS, TYPE II   N&V (nausea and vomiting)    UTI -Admitted to the hospital with nausea, vomiting abdominal pain, thought to be secondary to UTI. -Urinalysis consistent with UTI and. -Patient started empirically on Rocephin, will at this antibiotics according to culture results.  MS -Resume home medications, no changes.  Uncontrolled hypertension -Resume home medications, use as needed and IV hydralazine.  CKD stage II -Patient has new baseline of 1.04 creatinine.  Diabetes mellitus type 2  -Patient is on oral hypoglycemics, held on admission. -Started insulin sliding scale and utilize carbohydrate modified diet.  Code Status: Full code Family Communication: Plan discussed with the patient. Disposition Plan: Remains inpatient   Consultants:  None  Procedures:  None  Antibiotics:  Rocephin   Objective: Filed Vitals:   12/07/12 0713  BP: 167/78  Pulse: 66  Temp:   Resp:     Intake/Output Summary (Last 24 hours) at 12/07/12 1003 Last data filed at 12/07/12 0813  Gross per 24 hour  Intake 1208.33 ml  Output    300 ml  Net 908.33 ml   Filed Weights   12/06/12 2010  Weight: 108.863 kg (240 lb)    Exam: General: Alert and awake, oriented x3, not in any acute distress. HEENT: anicteric sclera, pupils reactive to light and accommodation, EOMI CVS: S1-S2 clear, no murmur rubs or gallops Chest: clear to auscultation bilaterally, no wheezing, rales or rhonchi Abdomen: soft nontender, nondistended, normal bowel sounds, no organomegaly Extremities: no cyanosis, clubbing or edema noted bilaterally Neuro: Cranial nerves II-XII  intact, no focal neurological deficits  Data Reviewed: Basic Metabolic Panel:  Recent Labs Lab 12/06/12 1304 12/06/12 2208 12/07/12 0545  NA 137  --  140  K 3.6  --  3.7  CL 99  --  102  CO2 23  --  21  GLUCOSE 154*  --  147*  BUN 31*  --  37*  CREATININE 0.92 0.94 1.02  CALCIUM 9.5  --  9.3   Liver Function Tests:  Recent Labs Lab 12/06/12 1304  AST 42*  ALT 41*  ALKPHOS 123*  BILITOT 0.6  PROT 7.6  ALBUMIN 3.6    Recent Labs Lab 12/06/12 1304  LIPASE 30   No results found for this basename: AMMONIA,  in the last 168 hours CBC:  Recent Labs Lab 12/06/12 1304 12/06/12 2208 12/07/12 0545  WBC 12.0* 13.2* 12.6*  NEUTROABS 9.6*  --   --   HGB 16.1* 16.5* 16.8*  HCT 44.5 46.1* 48.1*  MCV 96.9 97.9 99.0  PLT 221 231 230   Cardiac Enzymes: No results found for this basename: CKTOTAL, CKMB, CKMBINDEX, TROPONINI,  in the last 168 hours BNP (last 3 results) No results found for this basename: PROBNP,  in the last 8760 hours CBG: No results found for this basename: GLUCAP,  in the last 168 hours  Micro No results found for this or any previous visit (from the past 240 hour(s)).   Studies: Ct Abdomen Pelvis Wo Contrast  12/06/2012   CLINICAL DATA:  Increasing abdominal pain. Nausea for the past week. Diabetic patient with history of breast cancer. Post appendectomy.  EXAM: CT ABDOMEN AND PELVIS WITHOUT CONTRAST  TECHNIQUE: Multidetector CT imaging of  the abdomen and pelvis was performed following the standard protocol without intravenous contrast.  COMPARISON:  07/23/2002.  FINDINGS: Stool filled right colon. No extra luminal bowel inflammatory process free fluid or free air.  Mild diastases rectus muscles without bowel containing the hernia.  8 cm left renal cysts minimally changed from 2004. Slight haziness of fat planes surrounding the kidneys noted previously without findings of obstruction.  Question gallstones.  Taking into account limitation by non  contrast imaging, no worrisome hepatic, splenic, pancreatic, renal or adrenal lesion. Hyperplasia of the adrenal glands.  Atherosclerotic type changes including coronary artery calcification and calcification with mild ectasia abdominal aorta are and branch vessels of the aorta.  Scoliosis and degenerative changes lower lumbar spine without worrisome osseous lesion.  IMPRESSION: No acute inflammatory process detected. Please see above.   Electronically Signed   By: Bridgett Larsson M.D.   On: 12/06/2012 15:55   Dg Chest Port 1 View  12/06/2012   CLINICAL DATA:  Nausea and vomiting  EXAM: PORTABLE CHEST - 1 VIEW  COMPARISON:  11/02/2011  FINDINGS: Cardiac shadow is within normal limits. The thoracic aorta is mildly tortuous. Atherosclerotic calcifications are noted. The lungs are well aerated bilaterally without focal infiltrate or sizable effusion.  IMPRESSION: No acute abnormality noted.   Electronically Signed   By: Alcide Clever M.D.   On: 12/06/2012 16:33    Scheduled Meds: . cloNIDine  0.3 mg Transdermal Weekly  . clopidogrel  75 mg Oral Q breakfast  . donepezil  10 mg Oral QHS  . enoxaparin (LOVENOX) injection  40 mg Subcutaneous Q24H  . levothyroxine  175 mcg Oral QAC breakfast  . losartan  50 mg Oral BID  . pantoprazole  40 mg Oral BID WC  . senna-docusate  2 tablet Oral QHS  . sodium chloride  3 mL Intravenous Q12H  . tiotropium  18 mcg Inhalation Daily  . venlafaxine XR  150 mg Oral Daily   Continuous Infusions:      Time spent: 35 minutes    [facility] A  [facility] Pager [phone] If 7PM-7AM, please contact night-coverage at [email or web address], password [email or web address] 12/07/2012, 10:03 AM  LOS: 1 day

## 2012-12-07 NOTE — Progress Notes (Signed)
   CARE MANAGEMENT NOTE 12/07/2012  Patient:  Ana Nunez, ZARO   Account Number:  [id number]  Date Initiated:  12/07/2012  Documentation initiated by:  [facility]  Subjective/Objective Assessment:   adm: nasuea, vomiting and abdominal pain since one week     Action/Plan:   discharge planning   Anticipated DC Date:  12/17/2012   Anticipated DC Plan:        DC Planning Services  CM consult      Choice offered to / List presented to:             Status of service:  In process, will continue to follow Medicare Important Message given?   (If response is "NO", the following Medicare IM given date fields will be blank) Date Medicare IM given:   Date Additional Medicare IM given:    Discharge Disposition:    Per UR Regulation:    If discussed at Long Length of Stay Meetings, dates discussed:    Comments:  12/07/12 16:57 CM spoke with daughter of pt who states she is also her Mother's Medical POA, [name] Cards [phone]. 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  [name] explained it was her Mother's choice to not continue with steroids and antibiotics.  Kim feels due to her mother's multiple health issues, a Palliative Care Consult/Hospice Consult may be in order to discuss the appropriate time to pursue this path.  CM called RN who states she will text the MD to inform of family request.  CM will sticky note the MD for tomorrow morning rounding and CM will continue to follow.  Freddy Jaksch, BSN, CM [phone].

## 2012-12-08 DIAGNOSIS — R259 Unspecified abnormal involuntary movements: Secondary | ICD-10-CM

## 2012-12-08 DIAGNOSIS — D126 Benign neoplasm of colon, unspecified: Secondary | ICD-10-CM

## 2012-12-08 LAB — CBC
HCT: 45.3 % (ref 36.0–46.0)
Hemoglobin: 15.9 g/dL — ABNORMAL HIGH (ref 12.0–15.0)
MCH: 34.8 pg — ABNORMAL HIGH (ref 26.0–34.0)
RBC: 4.57 MIL/uL (ref 3.87–5.11)
WBC: 10.1 10*3/uL (ref 4.0–10.5)

## 2012-12-08 LAB — BASIC METABOLIC PANEL
Calcium: 9.3 mg/dL (ref 8.4–10.5)
Chloride: 103 mEq/L (ref 96–112)
GFR calc Af Amer: 44 mL/min — ABNORMAL LOW (ref >90)
GFR calc non Af Amer: 38 mL/min — ABNORMAL LOW (ref >90)
Glucose, Bld: 128 mg/dL — ABNORMAL HIGH (ref 70–99)
Potassium: 3.9 mEq/L (ref 3.5–5.1)
Sodium: 139 mEq/L (ref 135–145)

## 2012-12-08 NOTE — Progress Notes (Signed)
[facility] PROGRESS NOTE  Ana Nunez WUJ:[id number] DOB: Jul 18, 1929 DOA: 12/06/2012 PCP: Ana Mannan, MD  HPI/Subjective: No specific complaints, denies any pain. Prolonged discussion on her daughter yesterday, she asked for goals of care meeting.  Assessment/Plan: Principal Problem:   UTI (urinary tract infection) Active Problems:   HYPOTHYROIDISM   DIABETES MELLITUS, TYPE II   N&V (nausea and vomiting)    UTI -Admitted to the hospital with nausea, vomiting and abdominal pain. -CT scan of abdomen/pelvis did not show any acute findings. -Urinalysis consistent with UTI and. -Patient started empirically on Rocephin, will at this antibiotics according to culture results.  MS -Resume home medications, no changes.  Uncontrolled hypertension -Resume home medications, use as needed and IV hydralazine.  CKD stage II -Patient has new baseline of 1.04 creatinine.  Diabetes mellitus type 2  -Patient is on oral hypoglycemics, held on admission. -Started insulin sliding scale and utilize carbohydrate modified diet.  Advanced directives -Discussed with her daughter [name], patient is DO NOT RESUSCITATE. -Was goals of care meeting because of patient deteriorating health and she does have MS and severe arthritis.  Code Status: Full code Family Communication: Plan discussed with the patient. Disposition Plan: Remains inpatient   Consultants:  None  Procedures:  None  Antibiotics:  Rocephin   Objective: Filed Vitals:   12/08/12 0526  BP: 164/104  Pulse:   Temp:   Resp:     Intake/Output Summary (Last 24 hours) at 12/08/12 1105 Last data filed at 12/08/12 0900  Gross per 24 hour  Intake    100 ml  Output   1050 ml  Net   -950 ml   Filed Weights   12/06/12 2010  Weight: 108.863 kg (240 lb)    Exam: General: Alert and awake, oriented x3, not in any acute distress. HEENT: anicteric sclera, pupils reactive to light and accommodation, EOMI CVS: S1-S2  clear, no murmur rubs or gallops Chest: clear to auscultation bilaterally, no wheezing, rales or rhonchi Abdomen: soft nontender, nondistended, normal bowel sounds, no organomegaly Extremities: no cyanosis, clubbing or edema noted bilaterally Neuro: Cranial nerves II-XII intact, no focal neurological deficits  Data Reviewed: Basic Metabolic Panel:  Recent Labs Lab 12/06/12 1304 12/06/12 2208 12/07/12 0545 12/08/12 0557  NA 137  --  140 139  K 3.6  --  3.7 3.9  CL 99  --  102 103  CO2 23  --  21 23  GLUCOSE 154*  --  147* 128*  BUN 31*  --  37* 47*  CREATININE 0.92 0.94 1.02 1.29*  CALCIUM 9.5  --  9.3 9.3   Liver Function Tests:  Recent Labs Lab 12/06/12 1304  AST 42*  ALT 41*  ALKPHOS 123*  BILITOT 0.6  PROT 7.6  ALBUMIN 3.6    Recent Labs Lab 12/06/12 1304  LIPASE 30   No results found for this basename: AMMONIA,  in the last 168 hours CBC:  Recent Labs Lab 12/06/12 1304 12/06/12 2208 12/07/12 0545 12/08/12 0557  WBC 12.0* 13.2* 12.6* 10.1  NEUTROABS 9.6*  --   --   --   HGB 16.1* 16.5* 16.8* 15.9*  HCT 44.5 46.1* 48.1* 45.3  MCV 96.9 97.9 99.0 99.1  PLT 221 231 230 178   Cardiac Enzymes: No results found for this basename: CKTOTAL, CKMB, CKMBINDEX, TROPONINI,  in the last 168 hours BNP (last 3 results) No results found for this basename: PROBNP,  in the last 8760 hours CBG: No results found for this basename:  GLUCAP,  in the last 168 hours  Micro Recent Results (from the past 240 hour(s))  URINE CULTURE     Status: None   Collection Time    12/06/12  2:09 PM      Result Value Range Status   Specimen Description URINE, CATHETERIZED   Final   Special Requests NONE   Final   Culture  Setup Time     Final   Value: 12/06/2012 21:11     Performed at [facility] Count     Final   Value: >=100,000 COLONIES/ML     Performed at [facility]   Culture     Final   Value: GRAM NEGATIVE RODS     ESCHERICHIA COLI      Performed at [facility]   Report Status PENDING   Incomplete  CULTURE, BLOOD (ROUTINE X 2)     Status: None   Collection Time    12/06/12  6:11 PM      Result Value Range Status   Specimen Description BLOOD BLOOD RIGHT FOREARM   Final   Special Requests BOTTLES DRAWN AEROBIC AND ANAEROBIC 10CC   Final   Culture  Setup Time     Final   Value: 12/06/2012 22:30     Performed at [facility]   Culture     Final   Value:        BLOOD CULTURE RECEIVED NO GROWTH TO DATE CULTURE WILL BE HELD FOR 5 DAYS BEFORE ISSUING A FINAL NEGATIVE REPORT     Performed at [facility]   Report Status PENDING   Incomplete  CULTURE, BLOOD (ROUTINE X 2)     Status: None   Collection Time    12/06/12  6:18 PM      Result Value Range Status   Specimen Description BLOOD RIGHT WRIST   Final   Special Requests BOTTLES DRAWN AEROBIC ONLY 10CC   Final   Culture  Setup Time     Final   Value: 12/06/2012 22:30     Performed at [facility]   Culture     Final   Value:        BLOOD CULTURE RECEIVED NO GROWTH TO DATE CULTURE WILL BE HELD FOR 5 DAYS BEFORE ISSUING A FINAL NEGATIVE REPORT     Performed at [facility]   Report Status PENDING   Incomplete     Studies: Ct Abdomen Pelvis Wo Contrast  12/06/2012   CLINICAL DATA:  Increasing abdominal pain. Nausea for the past week. Diabetic patient with history of breast cancer. Post appendectomy.  EXAM: CT ABDOMEN AND PELVIS WITHOUT CONTRAST  TECHNIQUE: Multidetector CT imaging of the abdomen and pelvis was performed following the standard protocol without intravenous contrast.  COMPARISON:  07/23/2002.  FINDINGS: Stool filled right colon. No extra luminal bowel inflammatory process free fluid or free air.  Mild diastases rectus muscles without bowel containing the hernia.  8 cm left renal cysts minimally changed from 2004. Slight haziness of fat planes surrounding the kidneys noted previously without findings of obstruction.   Question gallstones.  Taking into account limitation by non contrast imaging, no worrisome hepatic, splenic, pancreatic, renal or adrenal lesion. Hyperplasia of the adrenal glands.  Atherosclerotic type changes including coronary artery calcification and calcification with mild ectasia abdominal aorta are and branch vessels of the aorta.  Scoliosis and degenerative changes lower lumbar spine without worrisome osseous lesion.  IMPRESSION: No acute inflammatory  process detected. Please see above.   Electronically Signed   By: Bridgett Larsson M.D.   On: 12/06/2012 15:55   Dg Chest Port 1 View  12/06/2012   CLINICAL DATA:  Nausea and vomiting  EXAM: PORTABLE CHEST - 1 VIEW  COMPARISON:  11/02/2011  FINDINGS: Cardiac shadow is within normal limits. The thoracic aorta is mildly tortuous. Atherosclerotic calcifications are noted. The lungs are well aerated bilaterally without focal infiltrate or sizable effusion.  IMPRESSION: No acute abnormality noted.   Electronically Signed   By: Alcide Clever M.D.   On: 12/06/2012 16:33    Scheduled Meds: . cloNIDine  0.3 mg Transdermal Weekly  . clopidogrel  75 mg Oral Q breakfast  . donepezil  10 mg Oral QHS  . enoxaparin (LOVENOX) injection  40 mg Subcutaneous Q24H  . levothyroxine  175 mcg Oral QAC breakfast  . losartan  100 mg Oral BID  . pantoprazole  40 mg Oral BID WC  . senna-docusate  2 tablet Oral QHS  . sodium chloride  3 mL Intravenous Q12H  . tiotropium  18 mcg Inhalation Daily  . venlafaxine XR  150 mg Oral Daily   Continuous Infusions:      Time spent: 35 minutes    [facility] A  [facility] Pager [phone] If 7PM-7AM, please contact night-coverage at [email or web address], password [facility] 12/08/2012, 11:05 AM  LOS: 2 days

## 2012-12-08 NOTE — Progress Notes (Signed)
Thank you for consulting the Palliative Medicine Team at [facility] to meet your patient's and family's needs.   The reason that you asked [name] to see your patient is  For  GOC  We have scheduled your patient for a meeting: 11/3 at 11 am  The Surrogate decision make is: Daughter Surveyor, quantity [phone]  Other family members that need to be present:  At kim's discretion    Your patient is able/unable to participate: unclear  Additional Narrative:    Estephanie Hubbs L. Ladona Ridgel, MD MBA The Palliative Medicine Team at [facility] Phone: [phone] Pager: [phone]

## 2012-12-09 ENCOUNTER — Telehealth: Payer: Self-pay | Admitting: Family Medicine

## 2012-12-09 DIAGNOSIS — R52 Pain, unspecified: Secondary | ICD-10-CM

## 2012-12-09 DIAGNOSIS — R5381 Other malaise: Secondary | ICD-10-CM

## 2012-12-09 DIAGNOSIS — R531 Weakness: Secondary | ICD-10-CM

## 2012-12-09 DIAGNOSIS — E119 Type 2 diabetes mellitus without complications: Secondary | ICD-10-CM

## 2012-12-09 DIAGNOSIS — Z515 Encounter for palliative care: Secondary | ICD-10-CM

## 2012-12-09 LAB — URINE CULTURE

## 2012-12-09 MED ORDER — AMOXICILLIN 500 MG PO CAPS
500.0000 mg | ORAL_CAPSULE | Freq: Three times a day (TID) | ORAL | Status: DC
  Administered 2012-12-09 – 2012-12-10 (×4): 500 mg via ORAL
  Filled 2012-12-09 (×6): qty 1

## 2012-12-09 MED ORDER — AMOXICILLIN 500 MG PO CAPS
500.0000 mg | ORAL_CAPSULE | Freq: Three times a day (TID) | ORAL | Status: DC
  Filled 2012-12-09 (×3): qty 1

## 2012-12-09 MED ORDER — MORPHINE SULFATE 15 MG PO TABS
7.5000 mg | ORAL_TABLET | ORAL | Status: DC | PRN
  Administered 2012-12-09 (×2): 7.5 mg via ORAL
  Filled 2012-12-09 (×2): qty 1

## 2012-12-09 MED ORDER — MORPHINE SULFATE ER 15 MG PO TBCR
15.0000 mg | EXTENDED_RELEASE_TABLET | Freq: Two times a day (BID) | ORAL | Status: DC
  Administered 2012-12-09 – 2012-12-10 (×3): 15 mg via ORAL
  Filled 2012-12-09 (×2): qty 1

## 2012-12-09 NOTE — Progress Notes (Addendum)
[facility] PROGRESS NOTE  JOEL MERICLE XBJ:[id number] DOB: 06-Jul-1929 DOA: 12/06/2012 PCP: [name], MD  Ana Nunez is a 79 y.o. female with prior h/o of MS, type 2 DM, hypothyroidism, hypertension, Depression, Dementia, was brought in by her daughter via ambulance for generalized weakness, nausea, vomiting, and abdominal pain since one week. On arrival she was found to have an UTI. Her labs revealed elevated white blood count and elevated hematocrit. CT abdomen and pelvis did nt reveal any acute pathology. She is referred to medical service for admission  Assessment/Plan:   UTI  -Admitted to the hospital with nausea, vomiting and abdominal pain.  -CT scan of abdomen/pelvis did not show any acute findings.  -Urinalysis consistent with UTI  -Culture shows E-coli, pan sensitive. -Received 1 dose of rocephin but has chosen full comfort care with out antibiotic, so no further treatment.   Vomiting -Resolved with supportive treatment.  MS  -Resume home medications, no changes.   Uncontrolled hypertension  -Resume home medications -use as needed and IV hydralazine.  -BP today 157/81  Hypothyroidism -Prior to admission - had an elevated TSH -Dose of Levothyroxine was increased -Continue levothyroxine.  CKD stage II  -Creatinine slowly rising.  Will stop monitoring labs.  Diabetes mellitus type 2  -Patient is on oral hypoglycemics, held on admission.  -Started insulin sliding scale and utilize carbohydrate modified diet.   COPD -stable -Continue home inhaler regimen.  Palliative Medicine -Discussed with her daughter Ana Nunez, patient is DO NOT RESUSCITATE.  -Palliative Medicine goals of care meeting held because of patient deteriorating health (MS and severe arthritis) -Patient has chosen to be full comfort care.  Palliative contacting Hospice to determine if patient is eligible.     Code Status: DNR/DNI Family Communication:  Disposition Plan: inpatient.  Discharge  11/4   Consultants:  Palliative care  Procedures:  none  Antibiotics:  Rocephin 1 g given 10/31  HPI/Subjective: Pt is uncooperative and I cannot assess her level of discomfort or pain, as she will not answer any questions.  On light palpation of several areas of her body she does tell me that it hurts.    Objective: Filed Vitals:   12/09/12 0637  BP: 157/81  Pulse:   Temp:   Resp:     Intake/Output Summary (Last 24 hours) at 12/09/12 1322 Last data filed at 12/09/12 1006  Gross per 24 hour  Intake    343 ml  Output    650 ml  Net   -307 ml   Filed Weights   12/06/12 2010  Weight: 108.863 kg (240 lb)    Exam:   General:  wdwn female lying in bed  Cardiovascular: RRR, no m/g/r  Respiratory: clear to auscultation bilaterally, no inc wob  Abdomen: + BS, nondistended obese, diffusely tender especially near liver border  Musculoskeletal: all 4 extremities with normal ROM, lower extremities painful to touch, nonedematous  Data Reviewed: Basic Metabolic Panel:  Recent Labs Lab 12/06/12 1304 12/06/12 2208 12/07/12 0545 12/08/12 0557  NA 137  --  140 139  K 3.6  --  3.7 3.9  CL 99  --  102 103  CO2 23  --  21 23  GLUCOSE 154*  --  147* 128*  BUN 31*  --  37* 47*  CREATININE 0.92 0.94 1.02 1.29*  CALCIUM 9.5  --  9.3 9.3   Liver Function Tests:  Recent Labs Lab 12/06/12 1304  AST 42*  ALT 41*  ALKPHOS 123*  BILITOT  0.6  PROT 7.6  ALBUMIN 3.6    Recent Labs Lab 12/06/12 1304  LIPASE 30   CBC:  Recent Labs Lab 12/06/12 1304 12/06/12 2208 12/07/12 0545 12/08/12 0557  WBC 12.0* 13.2* 12.6* 10.1  NEUTROABS 9.6*  --   --   --   HGB 16.1* 16.5* 16.8* 15.9*  HCT 44.5 46.1* 48.1* 45.3  MCV 96.9 97.9 99.0 99.1  PLT 221 231 230 178     Recent Results (from the past 240 hour(s))  URINE CULTURE     Status: None   Collection Time    12/06/12  2:09 PM      Result Value Range Status   Specimen Description URINE, CATHETERIZED   Final    Special Requests NONE   Final   Culture  Setup Time     Final   Value: 12/06/2012 21:11     Two isolates with different morphologies were identified as the same organism.The most resistant organism was reported.     Performed at [facility] Count     Final   Value: >=100,000 COLONIES/ML     Performed at [facility]   Culture     Final   Value: ESCHERICHIA COLI     Performed at [facility]   Report Status 12/09/2012 FINAL   Final   Organism ID, Bacteria ESCHERICHIA COLI   Final  CULTURE, BLOOD (ROUTINE X 2)     Status: None   Collection Time    12/06/12  6:11 PM      Result Value Range Status   Specimen Description BLOOD BLOOD RIGHT FOREARM   Final   Special Requests BOTTLES DRAWN AEROBIC AND ANAEROBIC 10CC   Final   Culture  Setup Time     Final   Value: 12/06/2012 22:30     Performed at [facility]   Culture     Final   Value:        BLOOD CULTURE RECEIVED NO GROWTH TO DATE CULTURE WILL BE HELD FOR 5 DAYS BEFORE ISSUING A FINAL NEGATIVE REPORT     Performed at [facility]   Report Status PENDING   Incomplete  CULTURE, BLOOD (ROUTINE X 2)     Status: None   Collection Time    12/06/12  6:18 PM      Result Value Range Status   Specimen Description BLOOD RIGHT WRIST   Final   Special Requests BOTTLES DRAWN AEROBIC ONLY 10CC   Final   Culture  Setup Time     Final   Value: 12/06/2012 22:30     Performed at [facility]   Culture     Final   Value:        BLOOD CULTURE RECEIVED NO GROWTH TO DATE CULTURE WILL BE HELD FOR 5 DAYS BEFORE ISSUING A FINAL NEGATIVE REPORT     Performed at [facility]   Report Status PENDING   Incomplete     Studies: No results found.  Scheduled Meds: . cloNIDine  0.3 mg Transdermal Weekly  . levothyroxine  175 mcg Oral QAC breakfast  . losartan  100 mg Oral BID  . morphine  15 mg Oral Q12H  . pantoprazole  40 mg Oral BID WC  . senna-docusate  2 tablet Oral QHS  . sodium  chloride  3 mL Intravenous Q12H  . tiotropium  18 mcg Inhalation Daily  . venlafaxine XR  150 mg Oral Daily  Continuous Infusions:   Principal Problem:   UTI (urinary tract infection) Active Problems:   HYPOTHYROIDISM   DIABETES MELLITUS, TYPE II   SCLEROSIS, MULTIPLE   COPD Gold D   Chronic kidney disease   N&V (nausea and vomiting)      Dorinda Hill, PA-S  Algis Downs, PA-C [facility] Pager [phone]. If 7PM-7AM, please contact night-coverage at [email or web address], password [facility] 12/09/2012, 1:22 PM  LOS: 3 days       Addendum  Patient seen and examined, chart and data base reviewed.  I agree with the above assessment and plan.  For full details please see Mrs. Algis Downs PA note.  Placed back on amoxicillin for UTI, because of family requests.  Hospice consulted, probably home with hospice in am.   Clint Lipps, MD [facility] Pager: [phone] 12/09/2012, 1:36 PM

## 2012-12-09 NOTE — Care Management Note (Signed)
    Page 1 of 2   12/10/2012     4:39:41 PM   CARE MANAGEMENT NOTE 12/10/2012  Patient:  Ana Nunez, Ana Nunez   Account Number:  [id number]  Date Initiated:  12/07/2012  Documentation initiated by:  [facility]  Subjective/Objective Assessment:   adm: nasuea, vomiting and abdominal pain since one week     Action/Plan:   discharge planning   Anticipated DC Date:  12/10/2012   Anticipated DC Plan:  HOME W HOSPICE CARE  In-house referral  Clinical Social Worker      DC Associate Professor  CM consult      PAC Choice  HOSPICE   Choice offered to / List presented to:  C-1 Patient   DME arranged  BEDSIDE COMMODE  HOSPITAL BED  OVERBED TABLE      DME agency  [facility] [place]     HH arranged  HH-1 RN      [facility] agency  [facility] [place]   Status of service:  Completed, signed off Medicare Important Message given?   (If response is "NO", the following Medicare IM given date fields will be blank) Date Medicare IM given:   Date Additional Medicare IM given:    Discharge Disposition:  HOME W HOSPICE CARE  Per UR Regulation:  Reviewed for med. necessity/level of care/duration of stay  If discussed at Long Length of Stay Meetings, dates discussed:    Comments:  12/10/12 16:38 Letha Cape RN ,BSN [phone] patient dc to home via ambulance.  12/09/12 14:59 Letha Cape RN, BSN [phone] NCM received referral to offer choice for home hospice, spoke with patient , she states she wants [facility] [place], spoke with daughter [name] Cards [phone] and she also said [facility] [place].  Referral made to [facility], faxed demo , progress notes and over.  Patient will need ambulance at dc tomorrow, CSW referral in.  Patient will need hospital bed, beside table and bsc.  Amil Amen with [facility] [place] stated they will get DME for patient and deliver to home.  I gave Amil Amen the patient's poa phone number.   12/07/12 16:57 CM spoke  with daughter of pt who states she is also her Mother's Medical Karna Dupes Cards [phone]. 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  [name] explained it was her Mother's choice to not continue with steroids and antibiotics.  Kim feels due to her mother's multiple health issues, a Palliative Care Consult/Hospice Consult may be in order to discuss the appropriate time to pursue this path.  CM called RN who states she will text the MD to inform of family request.  CM will sticky note the MD for tomorrow morning rounding and CM will continue to follow.  Freddy Jaksch, BSN, CM [phone].

## 2012-12-09 NOTE — Telephone Encounter (Signed)
Noted, thank you for the update

## 2012-12-09 NOTE — Telephone Encounter (Signed)
Amil Amen from [facility] called regarding pt pending d/c from [facility] on 12/10/12.  Requesting pt be placed on hospice when she returns to [facility].  She will take verbal order over the phone, cb [phone].  Dr. Dayton Martes out of office, Routed to Nicki Reaper, NP.

## 2012-12-09 NOTE — Consult Note (Signed)
Patient ZO:XWRUE JAIYA MOORADIAN      DOB: 04/06/1929      AVW:[id number]     Consult Note from the Palliative Medicine Team at [facility]    Consult Requested by: Dr Arthor Captain     PCP: [name], MD Reason for Consultation:Clarification of GOC and options     Phone [phone]  Assessment of patients Current state:   Ana Nunez is a 79 y.o. female with prior h/o of MS, type 2 DM, hypothyroidism, hypertension, Depression, CVA (may 2014) Dementia, was brought in by her daughter via ambulance for generalized weakness, nausea, vomiting, and abdominal pain since one week. On arrival she was found to have an UTI. Her labs revealed elevated white blood count and elevated hematocrit. CT abdomen and pelvis did nt reveal any acute pathology.    Per family patient has been dramatically declining over the past six months with decreased functional and physical and cognitive decliene, h/o falls (now immobile), poor po intake, weight loss.  She has in the past been able to verbalize to her family her desire to not prolong her life with medical interventions.  Her family support this decsion.  A MOST form is completed.  Family wish to avoid re-hospitalizations  Consult is for review of medical treatment options, clarification of goals of care and end of life issues, disposition and options, and symptom recommendation.  This NP Lorinda Creed reviewed medical records, received report from team, assessed the patient and then meet at the patient's bedside along with her family to include her daughter [name] #[phone], her husband and another daughter   to discuss diagnosis prognosis, GOC, EOL wishes disposition and options.   A detailed discussion was had today regarding advanced directives.  Concepts specific to code status, artifical feeding and hydration, continued IV antibiotics and rehospitalization was had.  The difference between a aggressive medical intervention path  and a palliative comfort care path for  this patient at this time was had.  Values and goals of care important to patient and family were attempted to be elicited.  Concept of Hospice and Palliative Care were discussed  Natural trajectory and expectations at EOL were discussed.  Questions and concerns addressed.  Hard Choices booklet left for review. Family encouraged to call with questions or concerns.  PMT will continue to support holistically.    Goals of Care: 1.  Code Status:  DNR/DNI-comfort is main focus of care   2. Scope of Treatment: 1. Vital Signs:daily  2. Respiratory/Oxygen:for comfort only 3. Nutritional Support/Tube Feeds:no artifical feeding now or in the future 4. Antibiotics: determine use or limitation when infection occurs 5. IVF: none 6. Review of Medications to be discontinued: minimize for comfort 7. Labs:none 8. Telemetry:none    4. Disposition:  Hopeful to disposition home with hospice services if eligible.  Will write for choice, will need ambulance transport home and equipment Family plans to add extra caregivers in the home to make transition successful  3. Symptom Management:   1. Pain:  Generalized, unable to scale due to altered cognition, (today tells me her right arm is so bad she wants to take it off)        Morphine Er 15 mg every 12 hours/Morphine IR 7.5 mg every 4 hrs prn       Anxiety/Depression: Effexor EX-150 mg daily 2. Bowel Regimen: Senna-s 2 tablets qhs 3. Urinary incontinence-leave foley in place for dc home, family understands the increased risk of infiection-comfort is focus of care  4. Psychosocial:  Emotional support offered to patientr and family          Patient Documents Completed or Given: Document Given Completed  Advanced Directives Pkt    MOST  yes  DNR    Gone from My Sight    Hard Choices yes     Brief HPI: Ana Nunez is a 79 y.o. female with prior h/o of MS, type 2 DM, hypothyroidism, hypertension, Depression, CVA (may 2014) Dementia, was  brought in by her daughter via ambulance for generalized weakness, nausea, vomiting, and abdominal pain since one week. On arrival she was found to have an UTI. Her labs revealed elevated white blood count and elevated hematocrit. CT abdomen and pelvis did nt reveal any acute pathology.    Per family patient has been dramatically declining over the past six months with decreased functional and physical and cognitive decliene, h/o falls (now immobile), poor po intake, weight loss.  She has in the past been able to verbalize to her family her desire to not prolong her life with medical interventions.  Her family support this decsion.  A MOST form is completed.  Family wish to avoid re-hospitalizations    ROS: genrealized pain and discomfort, intermittent confusion    PMH:  Past Medical History  Diagnosis Date  . [name]   . Fatty liver   . GERD (gastroesophageal reflux disease)   . Type II or unspecified type diabetes mellitus without mention of complication, not stated as uncontrolled   . Positive PPD   . Abnormal involuntary movements(781.0)   . Hypercholesteremia   . Hypothyroidism   . Unspecified urinary incontinence   . Vocal cord dysfunction   . Multiple sclerosis   . Hypertension   . Depression   . CAD (coronary artery disease)   . COPD (chronic obstructive pulmonary disease)   . Asthma   . Community acquired pneumonia   . Arthritis   . Adenocarcinoma, breast     Right breast  . Emphysema of lung   . Neuromuscular disorder     MS  . TIA (transient ischemic attack)   . Breast cancer      PSH: Past Surgical History  Procedure Laterality Date  . Appendectomy  1942  . Tonsillectomy  1951  . Dilation and curettage of uterus  1964  . Nasal sinus surgery  1999  . Breast lumpectomy  1/06    Right  . Fracture surgery  may 2010    R ankle    I have reviewed the FH and SH and  If appropriate update it with new information. Allergies  Allergen Reactions  . Avelox  [Moxifloxacin Hcl In Nacl] Rash    pruritis   Scheduled Meds: . amoxicillin  500 mg Oral Q8H  . cloNIDine  0.3 mg Transdermal Weekly  . levothyroxine  175 mcg Oral QAC breakfast  . losartan  100 mg Oral BID  . morphine  15 mg Oral Q12H  . pantoprazole  40 mg Oral BID WC  . senna-docusate  2 tablet Oral QHS  . sodium chloride  3 mL Intravenous Q12H  . tiotropium  18 mcg Inhalation Daily  . venlafaxine XR  150 mg Oral Daily   Continuous Infusions:  PRN Meds:.albuterol, albuterol, benzonatate, HYDROcodone-acetaminophen, morphine, morphine injection, ondansetron (ZOFRAN) IV, ondansetron, polyethylene glycol, promethazine    BP 157/81  Pulse 62  Temp(Src) 97.9 F (36.6 C) (Oral)  Resp 18  Ht 5\' 4"  (1.626 m)  Wt 108.863 kg (240  lb)  BMI 41.18 kg/m2  SpO2 94%   PPS:30 %   Intake/Output Summary (Last 24 hours) at 12/09/12 1301 Last data filed at 12/09/12 1006  Gross per 24 hour  Intake    343 ml  Output    650 ml  Net   -307 ml     Physical Exam:  General: chronically ill appearing, genrally uncomfortable  NAD HEENT:  Moist buccal membranes, No exudate, poor dentation Chest:   Diminished in bases, scattered coarse BS CVS: RRR Abdomen: soft NT +BS Ext: without edema, +muscle atrophy  Neuro: oriented to person and family  Labs: CBC    Component Value Date/Time   WBC 10.1 12/08/2012 0557   WBC 9.2 10/17/2012 1133   RBC 4.57 12/08/2012 0557   RBC 3.99 10/17/2012 1133   HGB 15.9* 12/08/2012 0557   HGB 13.4 10/17/2012 1133   HCT 45.3 12/08/2012 0557   HCT 39.7 10/17/2012 1133   PLT 178 12/08/2012 0557   PLT 199 10/17/2012 1133   MCV 99.1 12/08/2012 0557   MCV 99.4 10/17/2012 1133   MCH 34.8* 12/08/2012 0557   MCH 33.5 10/17/2012 1133   MCHC 35.1 12/08/2012 0557   MCHC 33.7 10/17/2012 1133   RDW 13.5 12/08/2012 0557   RDW 13.7 10/17/2012 1133   LYMPHSABS 1.4 12/06/2012 1304   LYMPHSABS 2.5 10/17/2012 1133   MONOABS 0.9 12/06/2012 1304   MONOABS 0.7 10/17/2012 1133   EOSABS  0.0 12/06/2012 1304   EOSABS 0.4 10/17/2012 1133   BASOSABS 0.0 12/06/2012 1304   BASOSABS 0.1 10/17/2012 1133    BMET    Component Value Date/Time   NA 139 12/08/2012 0557   NA 136 10/17/2012 1134   K 3.9 12/08/2012 0557   K 5.3* 10/17/2012 1134   CL 103 12/08/2012 0557   CO2 23 12/08/2012 0557   CO2 29 10/17/2012 1134   GLUCOSE 128* 12/08/2012 0557   GLUCOSE 103 10/17/2012 1134   BUN 47* 12/08/2012 0557   BUN 34.7* 10/17/2012 1134   CREATININE 1.29* 12/08/2012 0557   CREATININE 1.2* 10/17/2012 1134   CALCIUM 9.3 12/08/2012 0557   CALCIUM 9.6 10/17/2012 1134   GFRNONAA 38* 12/08/2012 0557   GFRAA 44* 12/08/2012 0557    CMP     Component Value Date/Time   NA 139 12/08/2012 0557   NA 136 10/17/2012 1134   K 3.9 12/08/2012 0557   K 5.3* 10/17/2012 1134   CL 103 12/08/2012 0557   CO2 23 12/08/2012 0557   CO2 29 10/17/2012 1134   GLUCOSE 128* 12/08/2012 0557   GLUCOSE 103 10/17/2012 1134   BUN 47* 12/08/2012 0557   BUN 34.7* 10/17/2012 1134   CREATININE 1.29* 12/08/2012 0557   CREATININE 1.2* 10/17/2012 1134   CALCIUM 9.3 12/08/2012 0557   CALCIUM 9.6 10/17/2012 1134   PROT 7.6 12/06/2012 1304   PROT 6.8 10/17/2012 1134   ALBUMIN 3.6 12/06/2012 1304   ALBUMIN 3.3* 10/17/2012 1134   AST 42* 12/06/2012 1304   AST 20 10/17/2012 1134   ALT 41* 12/06/2012 1304   ALT 26 10/17/2012 1134   ALKPHOS 123* 12/06/2012 1304   ALKPHOS 93 10/17/2012 1134   BILITOT 0.6 12/06/2012 1304   BILITOT 0.35 10/17/2012 1134   GFRNONAA 38* 12/08/2012 0557   GFRAA 44* 12/08/2012 0557      Time In Time Out Total Time Spent with Patient Total Overall Time  1100 1230 80 min 90 min    Greater than 50%  of this  time was spent counseling and coordinating care related to the above assessment and plan. Lorinda Creed NP  Palliative Medicine Team Team Phone # [phone] Pager [phone]  Discussed with Dr Arthor Captain

## 2012-12-10 DIAGNOSIS — C50919 Malignant neoplasm of unspecified site of unspecified female breast: Secondary | ICD-10-CM

## 2012-12-10 MED ORDER — CYCLOBENZAPRINE HCL 5 MG PO TABS: 5.0000 mg | ORAL_TABLET | Freq: Three times a day (TID) | ORAL | Status: AC

## 2012-12-10 MED ORDER — LIDOCAINE 5 % EX PTCH: 1.0000 | MEDICATED_PATCH | CUTANEOUS | Status: AC

## 2012-12-10 MED ORDER — MORPHINE SULFATE ER 15 MG PO TBCR: 15.0000 mg | EXTENDED_RELEASE_TABLET | Freq: Two times a day (BID) | ORAL | Status: AC

## 2012-12-10 MED ORDER — MUSCLE RUB 10-15 % EX CREA: TOPICAL_CREAM | CUTANEOUS | Status: DC | PRN

## 2012-12-10 MED ORDER — MORPHINE SULFATE 15 MG PO TABS: 7.5000 mg | ORAL_TABLET | ORAL | Status: AC | PRN

## 2012-12-10 MED ORDER — HYDROMORPHONE HCL PF 1 MG/ML IJ SOLN
1.0000 mg | Freq: Once | INTRAMUSCULAR | Status: AC
  Administered 2012-12-10: 1 mg via INTRAVENOUS
  Filled 2012-12-10: qty 1

## 2012-12-10 MED ORDER — CYCLOBENZAPRINE HCL 5 MG PO TABS
5.0000 mg | ORAL_TABLET | Freq: Three times a day (TID) | ORAL | Status: DC
  Administered 2012-12-10: 5 mg via ORAL
  Filled 2012-12-10 (×2): qty 1

## 2012-12-10 MED ORDER — LIDOCAINE 5 % EX PTCH
1.0000 | MEDICATED_PATCH | CUTANEOUS | Status: DC
  Administered 2012-12-10: 1 via TRANSDERMAL
  Filled 2012-12-10: qty 1

## 2012-12-10 NOTE — Telephone Encounter (Signed)
Noted  

## 2012-12-10 NOTE — Telephone Encounter (Signed)
Called and spoke with julila at [facility]. Gave verbal order for hospice

## 2012-12-10 NOTE — Telephone Encounter (Signed)
Ana Nunez with [facility] said since pt is in the independent living division of [place] and needs verbal order directly from the pts PCP that it is OK for hospice to give services to pt.Ana Nunez request cb.

## 2012-12-10 NOTE — Telephone Encounter (Signed)
Amil Amen with [facility] left v/m needs verbal order for hospice to see pt today; pts family is requesting hospice come to [facility] today.Please advise.

## 2012-12-10 NOTE — Progress Notes (Signed)
Patients daughter, [name], is concerned about the severe right shoulder pain patient is having when rolling in bed. 1mg  of morphine IV was given before bath and bed change and patient was still in severe pain. Pain started yesterday morning and has not improved since starting new narcotics yesterday. Notified Algis Downs.

## 2012-12-10 NOTE — Progress Notes (Signed)
Ana Nunez to be D/C'd [facility] per MD order.  Discussed with the patient and all questions fully answered.    Medication List    STOP taking these medications       donepezil 10 MG tablet  Commonly known as:  ARICEPT     HYDROcodone-acetaminophen 5-325 MG per tablet  Commonly known as:  NORCO/VICODIN      TAKE these medications       albuterol 108 (90 BASE) MCG/ACT inhaler  Commonly known as:  PROVENTIL HFA;VENTOLIN HFA  Inhale 2 puffs into the lungs every 6 (six) hours as needed for wheezing or shortness of breath.     albuterol (2.5 MG/3ML) 0.083% nebulizer solution  Commonly known as:  PROVENTIL  Take 2.5 mg by nebulization every 6 (six) hours as needed for wheezing or shortness of breath.     benzonatate 200 MG capsule  Commonly known as:  TESSALON  Take 200 mg by mouth 3 (three) times daily as needed for cough.     cloNIDine 0.3 MG tablet  Commonly known as:  CATAPRES  Take 1 tablet (0.3 mg total) by mouth 2 (two) times daily.     clopidogrel 75 MG tablet  Commonly known as:  PLAVIX  Take 1 tablet (75 mg total) by mouth daily.     cyclobenzaprine 5 MG tablet  Commonly known as:  FLEXERIL  Take 1 tablet (5 mg total) by mouth 3 (three) times daily.     famotidine 20 MG tablet  Commonly known as:  PEPCID  Take 1 tablet (20 mg total) by mouth at bedtime.     fesoterodine 8 MG Tb24 tablet  Commonly known as:  TOVIAZ  Take 1 tablet (8 mg total) by mouth daily.     glipiZIDE 5 MG 24 hr tablet  Commonly known as:  GLIPIZIDE XL  Take 1 tablet (5 mg total) by mouth daily.     levothyroxine 175 MCG tablet  Commonly known as:  SYNTHROID, LEVOTHROID  Take 1 tablet (175 mcg total) by mouth daily before breakfast.     lidocaine 5 %  Commonly known as:  LIDODERM  Place 1 patch onto the skin daily. Remove & Discard patch within 12 hours or as directed by MD     losartan 50 MG tablet  Commonly known as:  COZAAR  Take 1 tablet (50 mg total) by mouth  2 (two) times daily.     magnesium oxide 400 MG tablet  Commonly known as:  MAG-OX  Take 400 mg by mouth daily.     morphine 15 MG 12 hr tablet  Commonly known as:  MS CONTIN  Take 1 tablet (15 mg total) by mouth every 12 (twelve) hours.     morphine 15 MG tablet  Commonly known as:  MSIR  Take 0.5 tablets (7.5 mg total) by mouth every 4 (four) hours as needed.     multivitamin with minerals Tabs tablet  Take 1 tablet by mouth daily.     ondansetron 8 MG tablet  Commonly known as:  ZOFRAN  Take 8 mg by mouth every 6 (six) hours as needed for nausea.     pantoprazole 40 MG tablet  Commonly known as:  PROTONIX  Take 1 tablet (40 mg total) by mouth 2 (two) times daily.     polyethylene glycol packet  Commonly known as:  MIRALAX / GLYCOLAX  Take 17 g by mouth as needed (constipation).     RA CALCIUM  PLUS VITAMIN D 600-400 MG-UNIT per tablet  Generic drug:  Calcium Carbonate-Vitamin D  Take 1 tablet by mouth 2 (two) times daily.     sennosides-docusate sodium 8.6-50 MG tablet  Commonly known as:  SENOKOT-S  Take 2 tablets by mouth daily.     tiotropium 18 MCG inhalation capsule  Commonly known as:  SPIRIVA  Place 1 capsule (18 mcg total) into inhaler and inhale daily.     traMADol 50 MG tablet  Commonly known as:  ULTRAM  Take 1 tablet (50 mg total) by mouth 5 (five) times daily.     venlafaxine XR 150 MG 24 hr capsule  Commonly known as:  EFFEXOR-XR  Take 1 capsule (150 mg total) by mouth daily.        VVS, Skin clean, dry and intact without evidence of skin break down, no evidence of skin tears noted. IV catheter discontinued intact. Site without signs and symptoms of complications. Dressing and pressure applied.  An After Visit Summary was printed and given to the daughter, Ana Nunez. Patient escorted via stretcher, and D/C to [facility] via [place].  Ana Nunez D 12/10/2012 4:21 PM

## 2012-12-10 NOTE — Discharge Summary (Signed)
Addendum  Patient seen and examined, chart and data base reviewed.  I agree with the above assessment and plan.  For full details please see Mrs. Algis Downs PA note.  UTI, Uncontrolled HTN and MS. Going home with hospice.   Clint Lipps, MD [facility] Pager: [phone] 12/10/2012, 12:47 PM

## 2012-12-10 NOTE — Discharge Summary (Signed)
Physician Discharge Summary  Ana Nunez WUJ:[id number] DOB: 1929/11/02 DOA: 12/06/2012  PCP: [name], MD  Admit date: 12/06/2012 Discharge date: 12/10/2012  Time spent: 60  minutes  Recommendations for Outpatient Follow-up:  Home with Hospice.  Will not return to the hospital. Comfort Care.  Discharge Diagnoses:  Principal Problem:   UTI (urinary tract infection) Active Problems:   HYPOTHYROIDISM   DIABETES MELLITUS, TYPE II   SCLEROSIS, MULTIPLE   COPD Gold D   Chronic kidney disease   N&V (nausea and vomiting)   Palliative care encounter   Pain, generalized   Weakness generalized   Discharge Condition: currently stable.  Poor prognosis  Diet recommendation: comfort feedings.  Filed Weights   12/06/12 2010  Weight: 108.863 kg (240 lb)    History of present illness:  Ana Nunez is a 79 y.o. female with prior h/o of MS, type 2 DM, hypothyroidism, hypertension, Depression, CVA (may 2014) Dementia, was brought in by her daughter via ambulance for generalized weakness, nausea, vomiting, and abdominal pain since one week. On arrival she was found to have an UTI. Her labs revealed elevated white blood count and elevated hematocrit. CT abdomen and pelvis did nt reveal any acute pathology.   Per family patient has been dramatically declining over the past six months with decreased functional and physical and cognitive decliene, h/o falls (now immobile), poor po intake, weight loss. She has in the past been able to verbalize to her family her desire to not prolong her life with medical interventions. Her family support this decsion. A MOST form is completed. Family wish to avoid re-hospitalizations    Hospital Course:   UTI  -Admitted to the hospital with nausea, vomiting and abdominal pain.  -CT scan of abdomen/pelvis did not show any acute findings.  -Urinalysis consistent with UTI  -Culture shows E-coli, pan sensitive.  -Received 1 dose of rocephin but has chosen  full comfort care with out antibiotic, so no further treatment.   Vomiting  -Resolved with supportive treatment.   MS  -Resume home medications, no changes.   Uncontrolled hypertension  -Resume home medications  -used as needed  IV hydralazine as an inpatient.  Hypothyroidism  -Prior to admission - had an elevated TSH  -Dose of Levothyroxine was increased  -Continue levothyroxine.   CKD stage II  -Creatinine slowly rising. Will stop monitoring labs.   Diabetes mellitus type 2  -Patient is on oral hypoglycemics, held on admission.  -Started insulin sliding scale and utilize carbohydrate modified diet.   COPD  -stable  -Continue home inhaler regimen.   Palliative Medicine  -Discussed with her daughter [name], patient is DO NOT RESUSCITATE.  -Palliative Medicine goals of care meeting held because of patient deteriorating health (MS and severe arthritis)  -Patient has chosen to be full comfort care. Patient is eligible for Hospice services and will be discharged to home with Hospice.  Goals of Care:  1. Code Status: DNR/DNI-comfort is main focus of care  2. Scope of Treatment:  1. Vital Signs:daily  2. Respiratory/Oxygen:for comfort only 3. Nutritional Support/Tube Feeds:no artifical feeding now or in the future 4. Antibiotics: determine use or limitation when infection occurs 5. IVF: none 6. Review of Medications to be discontinued: minimize for comfort 7. Labs:none 8. Telemetry:none   Consultations:  Palliative Medicine.  Discharge Exam: Filed Vitals:   12/10/12 0542  BP: 161/89  Pulse: 79  Temp: 98.2 F (36.8 C)  Resp: 18   General: wdwn female lying in bed ,  pleasant. Cardiovascular: RRR, no m/g/r  Respiratory: clear to auscultation bilaterally, no inc wob Abdomen: + BS, nondistended obese, diffusely tender especially near liver border, no obvious masses. Musculoskeletal: all 4 extremities with normal ROM, lower extremities painful to touch, non-edematous.   Patient with significant  Pain in right shoulder. Painful to light palpation or movement.    Discharge Instructions      Discharge Orders   Future Appointments Provider Department Dept Phone   01/13/2013 12:45 PM [facility] [phone]   04/17/2013 11:00 AM Dava Najjar Idelle Jo [facility] MEDICAL ONCOLOGY [phone]   04/17/2013 11:30 AM Lowella Dell, MD [facility] MEDICAL ONCOLOGY [phone]   Future Orders Complete By Expires   Diet general  As directed    Increase activity slowly  As directed        Medication List    STOP taking these medications       donepezil 10 MG tablet  Commonly known as:  ARICEPT     HYDROcodone-acetaminophen 5-325 MG per tablet  Commonly known as:  NORCO/VICODIN      TAKE these medications       albuterol 108 (90 BASE) MCG/ACT inhaler  Commonly known as:  PROVENTIL HFA;VENTOLIN HFA  Inhale 2 puffs into the lungs every 6 (six) hours as needed for wheezing or shortness of breath.     albuterol (2.5 MG/3ML) 0.083% nebulizer solution  Commonly known as:  PROVENTIL  Take 2.5 mg by nebulization every 6 (six) hours as needed for wheezing or shortness of breath.     benzonatate 200 MG capsule  Commonly known as:  TESSALON  Take 200 mg by mouth 3 (three) times daily as needed for cough.     cloNIDine 0.3 MG tablet  Commonly known as:  CATAPRES  Take 1 tablet (0.3 mg total) by mouth 2 (two) times daily.     clopidogrel 75 MG tablet  Commonly known as:  PLAVIX  Take 1 tablet (75 mg total) by mouth daily.     cyclobenzaprine 5 MG tablet  Commonly known as:  FLEXERIL  Take 1 tablet (5 mg total) by mouth 3 (three) times daily.     famotidine 20 MG tablet  Commonly known as:  PEPCID  Take 1 tablet (20 mg total) by mouth at bedtime.     fesoterodine 8 MG Tb24 tablet  Commonly known as:  TOVIAZ  Take 1 tablet (8 mg total) by mouth daily.     glipiZIDE 5 MG 24 hr tablet   Commonly known as:  GLIPIZIDE XL  Take 1 tablet (5 mg total) by mouth daily.     levothyroxine 175 MCG tablet  Commonly known as:  SYNTHROID, LEVOTHROID  Take 1 tablet (175 mcg total) by mouth daily before breakfast.     lidocaine 5 %  Commonly known as:  LIDODERM  Place 1 patch onto the skin daily. Remove & Discard patch within 12 hours or as directed by MD     losartan 50 MG tablet  Commonly known as:  COZAAR  Take 1 tablet (50 mg total) by mouth 2 (two) times daily.     magnesium oxide 400 MG tablet  Commonly known as:  MAG-OX  Take 400 mg by mouth daily.     morphine 15 MG 12 hr tablet  Commonly known as:  MS CONTIN  Take 1 tablet (15 mg total) by mouth every 12 (twelve) hours.     morphine  15 MG tablet  Commonly known as:  MSIR  Take 0.5 tablets (7.5 mg total) by mouth every 4 (four) hours as needed.     multivitamin with minerals Tabs tablet  Take 1 tablet by mouth daily.     ondansetron 8 MG tablet  Commonly known as:  ZOFRAN  Take 8 mg by mouth every 6 (six) hours as needed for nausea.     pantoprazole 40 MG tablet  Commonly known as:  PROTONIX  Take 1 tablet (40 mg total) by mouth 2 (two) times daily.     polyethylene glycol packet  Commonly known as:  MIRALAX / GLYCOLAX  Take 17 g by mouth as needed (constipation).     RA CALCIUM PLUS VITAMIN D 600-400 MG-UNIT per tablet  Generic drug:  Calcium Carbonate-Vitamin D  Take 1 tablet by mouth 2 (two) times daily.     sennosides-docusate sodium 8.6-50 MG tablet  Commonly known as:  SENOKOT-S  Take 2 tablets by mouth daily.     tiotropium 18 MCG inhalation capsule  Commonly known as:  SPIRIVA  Place 1 capsule (18 mcg total) into inhaler and inhale daily.     traMADol 50 MG tablet  Commonly known as:  ULTRAM  Take 1 tablet (50 mg total) by mouth 5 (five) times daily.     venlafaxine XR 150 MG 24 hr capsule  Commonly known as:  EFFEXOR-XR  Take 1 capsule (150 mg total) by mouth daily.       Allergies   Allergen Reactions  . Avelox [Moxifloxacin Hcl In Nacl] Rash    pruritis      The results of significant diagnostics from this hospitalization (including imaging, microbiology, ancillary and laboratory) are listed below for reference.    Significant Diagnostic Studies: Ct Abdomen Pelvis Wo Contrast  12/06/2012   CLINICAL DATA:  Increasing abdominal pain. Nausea for the past week. Diabetic patient with history of breast cancer. Post appendectomy.  EXAM: CT ABDOMEN AND PELVIS WITHOUT CONTRAST  TECHNIQUE: Multidetector CT imaging of the abdomen and pelvis was performed following the standard protocol without intravenous contrast.  COMPARISON:  07/23/2002.  FINDINGS: Stool filled right colon. No extra luminal bowel inflammatory process free fluid or free air.  Mild diastases rectus muscles without bowel containing the hernia.  8 cm left renal cysts minimally changed from 2004. Slight haziness of fat planes surrounding the kidneys noted previously without findings of obstruction.  Question gallstones.  Taking into account limitation by non contrast imaging, no worrisome hepatic, splenic, pancreatic, renal or adrenal lesion. Hyperplasia of the adrenal glands.  Atherosclerotic type changes including coronary artery calcification and calcification with mild ectasia abdominal aorta are and branch vessels of the aorta.  Scoliosis and degenerative changes lower lumbar spine without worrisome osseous lesion.  IMPRESSION: No acute inflammatory process detected. Please see above.   Electronically Signed   By: Bridgett Larsson M.D.   On: 12/06/2012 15:55   Dg Chest Port 1 View  12/06/2012   CLINICAL DATA:  Nausea and vomiting  EXAM: PORTABLE CHEST - 1 VIEW  COMPARISON:  11/02/2011  FINDINGS: Cardiac shadow is within normal limits. The thoracic aorta is mildly tortuous. Atherosclerotic calcifications are noted. The lungs are well aerated bilaterally without focal infiltrate or sizable effusion.  IMPRESSION: No acute  abnormality noted.   Electronically Signed   By: Alcide Clever M.D.   On: 12/06/2012 16:33    Microbiology: Recent Results (from the past 240 hour(s))  URINE CULTURE  Status: None   Collection Time    12/06/12  2:09 PM      Result Value Range Status   Specimen Description URINE, CATHETERIZED   Final   Special Requests NONE   Final   Culture  Setup Time     Final   Value: 12/06/2012 21:11     Two isolates with different morphologies were identified as the same organism.The most resistant organism was reported.     Performed at [facility] Count     Final   Value: >=100,000 COLONIES/ML     Performed at [facility]   Culture     Final   Value: ESCHERICHIA COLI     Performed at [facility]   Report Status 12/09/2012 FINAL   Final   Organism ID, Bacteria ESCHERICHIA COLI   Final  CULTURE, BLOOD (ROUTINE X 2)     Status: None   Collection Time    12/06/12  6:11 PM      Result Value Range Status   Specimen Description BLOOD BLOOD RIGHT FOREARM   Final   Special Requests BOTTLES DRAWN AEROBIC AND ANAEROBIC 10CC   Final   Culture  Setup Time     Final   Value: 12/06/2012 22:30     Performed at [facility]   Culture     Final   Value:        BLOOD CULTURE RECEIVED NO GROWTH TO DATE CULTURE WILL BE HELD FOR 5 DAYS BEFORE ISSUING A FINAL NEGATIVE REPORT     Performed at [facility]   Report Status PENDING   Incomplete  CULTURE, BLOOD (ROUTINE X 2)     Status: None   Collection Time    12/06/12  6:18 PM      Result Value Range Status   Specimen Description BLOOD RIGHT WRIST   Final   Special Requests BOTTLES DRAWN AEROBIC ONLY 10CC   Final   Culture  Setup Time     Final   Value: 12/06/2012 22:30     Performed at [facility]   Culture     Final   Value:        BLOOD CULTURE RECEIVED NO GROWTH TO DATE CULTURE WILL BE HELD FOR 5 DAYS BEFORE ISSUING A FINAL NEGATIVE REPORT     Performed at [facility]   Report  Status PENDING   Incomplete     Labs: Basic Metabolic Panel:  Recent Labs Lab 12/06/12 1304 12/06/12 2208 12/07/12 0545 12/08/12 0557  NA 137  --  140 139  K 3.6  --  3.7 3.9  CL 99  --  102 103  CO2 23  --  21 23  GLUCOSE 154*  --  147* 128*  BUN 31*  --  37* 47*  CREATININE 0.92 0.94 1.02 1.29*  CALCIUM 9.5  --  9.3 9.3   Liver Function Tests:  Recent Labs Lab 12/06/12 1304  AST 42*  ALT 41*  ALKPHOS 123*  BILITOT 0.6  PROT 7.6  ALBUMIN 3.6    Recent Labs Lab 12/06/12 1304  LIPASE 30   CBC:  Recent Labs Lab 12/06/12 1304 12/06/12 2208 12/07/12 0545 12/08/12 0557  WBC 12.0* 13.2* 12.6* 10.1  NEUTROABS 9.6*  --   --   --   HGB 16.1* 16.5* 16.8* 15.9*  HCT 44.5 46.1* 48.1* 45.3  MCV 96.9 97.9 99.0 99.1  PLT 221 231 230 178  SignedConley Canal [phone]  [facility] 12/10/2012, 12:42 PM

## 2012-12-10 NOTE — Clinical Social Work Note (Signed)
CSW requested ambulance transport for patient to [place] home with hospice. Necessary documentation left on patient's chart. CSW signing off.  Roddie Mc, [place], [place], [phone]

## 2012-12-11 ENCOUNTER — Telehealth: Payer: Self-pay

## 2012-12-11 MED ORDER — FENTANYL 25 MCG/HR TD PT72: 25.0000 ug | MEDICATED_PATCH | TRANSDERMAL | Status: AC

## 2012-12-11 MED ORDER — MORPHINE SULFATE (CONCENTRATE) 20 MG/ML PO SOLN: 10.0000 mg | ORAL | Status: DC | PRN

## 2012-12-11 NOTE — Telephone Encounter (Signed)
Ana Nunez with [facility] request pain med; Morphine is not helping pts pain at present and Ana Nunez is requesting Fentanyl patch 25 mcg q72 hours and request 15 day supply that is covered under hospice and liquid Morphine sulfate (Roxinol) 20 mg/ml pt to take 0.5 ml q hour as needed for pain and SOB. Put hospice pt on rx for [facility] [address]. Ana Nunez request cb when done. Ana Nunez has made this an urgent request since pt is not doing well.

## 2012-12-11 NOTE — Telephone Encounter (Signed)
Faxed to [facility] pharmacy per Rhonda's (nurse) request.  Bjorn Loser informed.

## 2012-12-12 ENCOUNTER — Other Ambulatory Visit: Payer: Self-pay

## 2012-12-12 LAB — CULTURE, BLOOD (ROUTINE X 2): Culture: NO GROWTH

## 2012-12-12 MED ORDER — ATROPINE SULFATE 1 % OP SOLN: 2.0000 [drp] | OPHTHALMIC | Status: AC | PRN

## 2012-12-12 MED ORDER — MORPHINE SULFATE (CONCENTRATE) 20 MG/ML PO SOLN: 10.0000 mg | ORAL | Status: AC | PRN

## 2012-12-12 NOTE — Telephone Encounter (Signed)
Rhonda with [facility] left v/m; pt is declining; pt's daughter is giving liquid morphine 0.5 ml every hour. Rhonda request increase in dosage and med for secretions, Rhonda request Atropine gtts. Rhonda request cb.

## 2012-12-12 NOTE — Addendum Note (Signed)
Addended by: Tillman Abide I on: 12/12/2012 05:23 PM   Modules accepted: Orders

## 2012-12-12 NOTE — Telephone Encounter (Signed)
rx faxed to pharmacy manually [facility] [address]

## 2013-01-06 DEATH — deceased

## 2013-01-13 ENCOUNTER — Other Ambulatory Visit: Payer: Medicare Other

## 2013-04-17 ENCOUNTER — Other Ambulatory Visit: Payer: Medicare Other | Admitting: Lab

## 2013-04-17 ENCOUNTER — Ambulatory Visit: Payer: Medicare Other | Admitting: Oncology

## 2013-08-06 DEATH — deceased

## 2014-05-29 NOTE — Consult Note (Signed)
Referring Physician:  Idelle Crouch   Primary Care Physician:  Darol Destine, [address] [place], [place] [address], [place] [phone]  Reason for Consult: Admit Date: 23-Jun-2012  Chief Complaint: R sided weakness  Reason for Consult: CVA   History of Present Illness: History of Present Illness:   81 yo RHD F presents to [facility] with acute onset of R sided weakness two days ago.  Pt can no longer walk but baseline is walking with some R sided weakness and cane.  Pt has hx of multiple sclerosis that was diagnosed in 1985 and pt has improved as far as ambulation from this.  No flair in over 10 years and pt has not seen a neurologist in some time.  No numbness, tingling or headache  ROS:  General weakness   HEENT no complaints   Lungs no complaints   Cardiac no complaints   GI no complaints   GU no complaints   Musculoskeletal no complaints   Extremities no complaints   Skin no complaints   Neuro R sided weakness   Endocrine no complaints   Psych no complaints   Past Medical/Surgical Hx:  Multiple Sclerosis:   Hypercholesterolemia:   Diabetes:   copd:   Past Medical/ Surgical Hx:  Past Medical History HTN as well   Home Medications: Medication Instructions Last Modified Date/Time  Spiriva 18 mcg inhalation capsule 1 cap(s) via handihaler once a day (in the morning). 19-May-14 08:14  exemestane 25 mg oral tablet 1 tab(s) orally once a day (in the morning) 19-May-14 08:14  Toviaz 4 mg oral tablet, extended release 2 tabs ($Remov'8mg'mcTcya$ ) orally once a day (in the morning). 19-May-14 08:14  losartan 50 mg oral tablet 1 tab(s) orally once a day (in the morning) 19-May-14 08:14  venlafaxine extended release 150 mg oral capsule, extended release 1 cap(s) orally once a day (in the morning) 19-May-14 08:14  Aspirin Enteric Coated 81 mg oral delayed release tablet 1 tab(s) orally once a day (in the morning) 19-May-14 08:14  Synthroid 150 mcg (0.15 mg)  oral tablet 1 tab(s) orally once a day (in the morning) 19-May-14 08:14  glipiZIDE extended release 5 mg oral tablet, extended release 1 tab(s) orally once a day (in the morning) 19-May-14 08:14  Calcium 600+D 1 tab(s) orally 2 times a day 19-May-14 08:14  cloNIDine 0.3 mg oral tablet 1 tab(s) orally 2 times a day 19-May-14 08:14  Protonix 40 mg oral delayed release tablet 1 tab(s) orally 2 times a day (before breakfast and bed). 19-May-14 08:14  magnesium $RemoveBe'400mg'XxyrvCjxo$  gelcap 1 cap(s) orally once a day (at bedtime) 19-May-14 08:14  multivitamin 1 tab(s) orally once a day (at bedtime) 19-May-14 08:14  famotidine 20 mg oral tablet 1 tab(s) orally once a day (at bedtime) 19-May-14 08:14  cetirizine 10 mg oral tablet 1 tab(s) orally once a day (at bedtime) 19-May-14 08:14  docusate-senna 50 mg-8.6 mg oral tablet 1 tab(s) orally once a day (at bedtime) 19-May-14 08:14  MiraLax - powder for reconstitution 17 gram(s) or 1 packet in [place] of fluid and drink orally 1 to 2 times a day as needed for constipation. 19-May-14 08:14  albuterol CFC free 90 mcg/inh inhalation aerosol 2 puff(s) inhaled 4 times a day as needed for shortness of breath. 19-May-14 08:14  furosemide 20 mg oral tablet 1 tab(s) orally once a day as needed for edema. 19-May-14 08:14  Tylenol PM 500 mg-25 mg oral tablet 1 tab(s) orally once a day (  in the evening) 19-May-14 08:14  traMADol 50 mg oral tablet 1.5 tabs ($RemoveB'75mg'zCIlOxBf$ ) orally every 8 hours as needed for pain. 19-May-14 08:14  albuterol 2.5 mg/3 mL (0.083%) inhalation solution 1 vial (3 milliliters) via nebulizer every 4 to 8 hours as needed for shortness of breath. 19-May-14 08:14  Lortab 5/500 1 to 2 tab(s) orally every 4 to 6 hours as needed for severe pain. 19-May-14 08:14  accuflora tablet 1 tab(s) orally 2 times a day as needed for ABX. 19-May-14 08:14  Tessalon  200 mg oral capsule 2 cap(s) orally 3 times a day (morning, afternoon, and in the evening). 19-May-14 08:14  Mucinex DM 60 mg-1200  mg oral tablet, extended release 1 tab(s) orally 2 times a day 19-May-14 08:14  Tussionex [employer] 8 mg-10 mg/5 mL oral suspension, extended release 1 teaspoonsful (5 milliliters) orally 2 times a day as needed for severe cough. 19-May-14 08:14  Aricept 5 mg oral tablet 2 tab(s) orally once a day (at bedtime) 19-May-14 08:14  traMADol 50 mg oral tablet 2 tab(s) orally once a day (in the morning), As Needed 19-May-14 08:14  cyclobenzaprine 10 mg oral tablet 1 tab(s) orally in the morning and at bedtime, As Needed 19-May-14 08:14  Tylenol 650 mg oral tablet, extended release 2 tab(s) orally once a day (in the morning) 19-May-14 08:14   Allergies:  Avelox: Hives  Social/Family History: Employment Status: retired  Lives With: spouse  Living Arrangements: assisted living  Social History: no tob, no EtOH, no illicits, lives with husband in assisted living  Family History: n/c   Vital Signs: **Vital Signs.:   19-May-14 05:18  Vital Signs Type Routine  Temperature Temperature (F) 97.7  Celsius 36.5  Temperature Source oral  Pulse Pulse 77  Respirations Respirations 20  Systolic BP Systolic BP 355  Diastolic BP (mmHg) Diastolic BP (mmHg) 78  Mean BP 108  Pulse Ox % Pulse Ox % 97  Pulse Ox Activity Level  At rest  Oxygen Delivery Room Air/ 21 %    13:02  Pulse Pulse 78  Systolic BP Systolic BP 732  Diastolic BP (mmHg) Diastolic BP (mmHg) 73  Mean BP 105  Pulse Ox % Pulse Ox % 95   Physical Exam: General: overweight, NAD, resting comfortable  HEENT: normocephalic, sclera nonicteric, oropharynx clear  Neck: supple, no JVD, no bruits  Chest: CTA B, good movement, no wheezing  Cardiac: RRR, no murmurs, no edema, 2+ pulses  Extremities: no C/C/E, FROM   Neurologic Exam: Mental Status: A+Ox 2 not time, has problems with distant recall and complex thinking, no aphasia or dysarthria  Cranial Nerves: PERRLA, EOMI, nl VF, face symmetric, tongue midline, shoulder shrug equal  Motor  Exam: 5-/5 L, 3/5 R, nl tone  Deep Tendon Reflexes: 1+/4 B, down going plantars  Sensory Exam: pinprick, temperature, and vibration intact B  Coordination: untestable on R due to weakness, nl on L   Lab Results: Thyroid:  16-May-14 05:16   Thyroid Stimulating Hormone 0.524 (0.45-4.50 (International Unit)  ----------------------- Pregnant patients have  different reference  ranges for TSH:  - - - - - - - - - -  Pregnant, first trimetser:  0.36 - 2.50 uIU/mL)  Hepatic:  18-May-14 12:22   Bilirubin, Total 0.4  Alkaline Phosphatase 113  SGPT (ALT) 31  SGOT (AST) 27  Total Protein, Serum 7.2  Albumin, Serum 3.4  Routine Chem:  16-May-14 05:16   Cholesterol, Serum 178  Triglycerides, Serum 119  HDL (INHOUSE)  63  VLDL Cholesterol Calculated 24  LDL Cholesterol Calculated 91 (Result(s) reported on 24 Jun 2012 at 06:31AM.)  18-May-14 12:22   Glucose, Serum  134  BUN  22  Creatinine (comp) 1.30  Sodium, Serum 137  Potassium, Serum 4.4  Chloride, Serum 101  CO2, Serum 29  Calcium (Total), Serum 9.1  Osmolality (calc) 279  eGFR (African American)  45  eGFR (Non-African American)  39 (eGFR values <45mL/min/1.73 m2 may be an indication of chronic kidney disease (CKD). Calculated eGFR is useful in patients with stable renal function. The eGFR calculation will not be reliable in acutely ill patients when serum creatinine is changing rapidly. It is not useful in  patients on dialysis. The eGFR calculation may not be applicable to patients at the low and high extremes of body sizes, pregnant women, and vegetarians.)  Anion Gap 7  19-May-14 05:16   Hemoglobin A1c ([facility])  7.9 (The American Diabetes Association recommends that a primary goal of therapy should be <7% and that physicians should reevaluate the treatment regimen in patients with HbA1c values consistently >8%.)  Cardiac:  18-May-14 12:22   Troponin I < 0.02 (0.00-0.05 0.05 ng/mL or less: NEGATIVE  Repeat testing in  3-6 hrs  if clinically indicated. >0.05 ng/mL: POTENTIAL  MYOCARDIAL INJURY. Repeat  testing in 3-6 hrs if  clinically indicated. NOTE: An increase or decrease  of 30% or more on serial  testing suggests a  clinically important change)  CK, Total 36  CPK-MB, Serum  < 0.5 (Result(s) reported on 23 Jun 2012 at 12:52PM.)  Routine UA:  18-May-14 14:16   Color (UA) Yellow  Clarity (UA) Clear  Glucose (UA) Negative  Bilirubin (UA) Negative  Ketones (UA) Negative  Specific Gravity (UA) 1.014  Blood (UA) Negative  pH (UA) 7.0  Protein (UA) Negative  Nitrite (UA) Negative  Leukocyte Esterase (UA) Negative (Result(s) reported on 23 Jun 2012 at 02:41PM.)  RBC (UA) 1 /HPF  WBC (UA) <1 /HPF  Bacteria (UA) NONE SEEN  Epithelial Cells (UA) NONE SEEN  Mucous (UA) PRESENT (Result(s) reported on 23 Jun 2012 at 02:41PM.)  Routine Hem:  16-May-14 05:16   Neutrophil % 85.9  Lymphocyte % 12.1  Monocyte % 1.8  Eosinophil % 0.0  Basophil % 0.2  Neutrophil #  8.1  Lymphocyte # 1.1  Monocyte # 0.2  Eosinophil # 0.0  Basophil # 0.0 (Result(s) reported on 24 Jun 2012 at [facility].)  18-May-14 12:22   WBC (CBC) 9.0  RBC (CBC) 4.36  Hemoglobin (CBC) 14.6  Hematocrit (CBC) 42.2  Platelet Count (CBC) 159 (Result(s) reported on 23 Jun 2012 at 12:39PM.)  MCV 97  MCH 33.5  MCHC 34.6  RDW 13.5   Radiology Results: [name]:    19-May-14 10:29, US Carotid Doppler Bilateral  US Carotid Doppler Bilateral   REASON FOR EXAM:    CVA  COMMENTS:       PROCEDURE: [name]  - US CAROTID DOPPLER BILATERAL  - Jun 24 2012 10:29AM     RESULT: Comparison: None    Technique: Gray-scale, color Doppler, and spectral Doppler images were   obtained of the extracranial carotid artery systems and vertebral   arteries in the neck.    Findings:  Mild atherosclerotic plaque is demonstrated in the right carotid bulb and   proximal internal carotid artery. By visual inspection, there is less   than 50% stenosis. The peak  systolic velocities are not elevated.  The vertebral arteries are patent bilaterally.    IMPRESSION:  No evidence of hemodynamically significant stenosis in the extracranial   carotid arteries.        Verified By: Gregor Hams, M.D., MD  MRI:    19-May-14 12:32, MRI Brain Without Contrast  MRI Brain Without Contrast   REASON FOR EXAM:    r/o cva  COMMENTS:       PROCEDURE: MR  - MR BRAIN WO CONTRAST  - Jun 24 2012 12:32PM     RESULT: History: Rule out CVA    Technique: Multiplanar, multisequence MRI of the brain was obtained   without IV contrast.     Comparison:None    Findings:     There is restricted diffusion in the left side of the pons with     corresponding ADC hypointensity consistent with an acute nonhemorrhagic   infarct. There is a punctate area of restricted diffusion in the left   parietal lobe with corresponding ADC hypointensity. There are 2 foci of   restricted diffusion in the left frontoparietal lobes. There is no   hemorrhage. There is no pathologic extra-axial fluid collection. There is   generalized cerebral atrophy. There is periventricular and deep white   matter T2 and FLAIR hyperintensity likely secondary to microangiopathy.   There is no hydrocephalus. The ventricles are normal for age. The basal   cisterns are patent. There are no abnormal extra-axial fluid collections.     The visualized paranasal sinuses and mastoid sinuses are clear. The skull   base and calvarium demonstrate normal signal. The major intracranial flow   voids, including dural venous sinuses appear patent.    IMPRESSION:   Left pontine, left parietal lobe and left frontoparietal lobe acute   nonhemorrhagic infarcts.    Dictation Site: 1        Verified By: Jennette Banker, M.D., MD  CT:    18-May-14 13:03, CT Head Without Contrast  CT Head Without Contrast   REASON FOR EXAM:    weakness  COMMENTS:       PROCEDURE: CT  - CT HEAD WITHOUT CONTRAST  - Jun 23 2012   1:03PM     RESULT: Noncontrast CT scanning was performed through the brain with   reconstructions at 5 mm intervals and slice thicknesses. There are no   previous studies for comparison.    There is mild diffuse cerebral and cerebellar atrophy with compensatory   ventriculomegaly. There is no shift of the midline. There is decreased   density in the deep white matter of both cerebral hemispheres consistent   with chronic small vessel ischemia. There is an old subcentimeter lacunar   infarction in the right thalamic nucleus. There is no evidence of an   evolving ischemic infarction or of an intracranial hemorrhage.  At bone window settings the observed portions of the paranasal sinuses   and mastoid air cells are clear. There is no evidence of an acute skull   fracture.    IMPRESSION:  There is no acute intracranial abnormality. There are   chronic changes as described.    Dictation Site: 5        Verified By: DAVID A. [place], M.D., MD   Radiology Impression: Radiology Impression: MRI of brain personally reviewed by me and shows an acute L pontine stroke, small L frontal and small L occipital infarcts   Impression/Recommendations: Recommendations:   labs reviewed by me and LDL too high notes reviewed by me   L pontine acute infarct-  this is what is causing pts  symptoms and most likely due to small vessel disease from uncontrolled risk factors;  no hx to make [name] concerned for basilar disease but this could be embolic as well L frontal and L occipital acute infarcts-  small and asymptomatic with no clear etiology;  these appear to be more embolic than anything and likely cardioembolic Mild dementia-  not sure if this is from hx of multiple sclerosis or small vessel disease Hx of multiple sclerosis-  MRI is not too convincing for this and pt was diagnosed during a time when there were no MRI so I really question if this is true;  it could have been mistaken for bad white matter changes  seen like with the L pontine infarct MRA of head and neck to get better look at cerebral vasculature needs echo cardiogram check B12/folate, TSH, ESR, CRP agree with Plavix $RemoveBef'75mg'KKiYowqoNR$  daily would change to statin instead of Zetia with goal LDL < 70 needs better BP control with goal < 130/80 needs better DM control with goal Hem A1c < 7 d/c home per PT/OT but will likely need rehab will follow results  Electronic Signatures: Jamison Neighbor (MD)  (Signed 19-May-14 14:40)  Authored: REFERRING PHYSICIAN, Primary Care Physician, Consult, History of Present Illness, Review of Systems, PAST MEDICAL/SURGICAL HISTORY, HOME MEDICATIONS, ALLERGIES, Social/Family History, NURSING VITAL SIGNS, Physical Exam-, LAB RESULTS, RADIOLOGY RESULTS, Recommendations   Last Updated: 19-May-14 14:40 by Jamison Neighbor (MD)

## 2014-05-29 NOTE — H&P (Signed)
PATIENT NAME:  Ana Nunez, Ana Nunez MR#:  [id number] DATE OF BIRTH:  [birth year]  DATE OF ADMISSION:  06/23/2012  REFERRING PHYSICIAN: Lisa Roca, MD   FAMILY PHYSICIAN: Nonlocal   REASON FOR ADMISSION: Right-sided weakness worrisome for acute stroke.   HISTORY OF PRESENT ILLNESS: The patient is an 81 year old female who resides at independent living at [facility] with her husband. She has a history of multiple sclerosis, diabetes, COPD and benign hypertension. She cannot tell me the name of her family physician. She used see Dr. Erling Cruz of Neurology but has not seen him in well over a year. She presents to the Emergency Room with a 24 to 36-hour history of right-sided weakness including upper and lower extremity. No headaches or fever. No visual disturbance. In the Emergency Room, the patient was unable to ambulate because of her weakness. She cannot take care of herself at home nor can her husband take care of her. She is now admitted for further evaluation.   PAST MEDICAL HISTORY: 1.  Multiple sclerosis.  2.  Chronic obstructive pulmonary disease.  3.  Type 2 diabetes.  4.  Hyperlipidemia.  5.  Benign hypertension.  6.  Overactive bladder.  7.  Depression.  8.  Hypothyroidism.  9.  History of breast cancer.   MEDICATIONS: 1.  Zyrtec 10 mg p.o. daily.  2.  Zetia 10 mg p.o. daily.  3.  Effexor-XR 75 mg p.o. daily.  4.  Toviaz 4 mg p.o. daily.  5.  Cozaar 50 mg p.o. daily.  6.  Pepcid 20 mg p.o. at bedtime.  7.  Flexeril 10 mg p.o. b.i.d.  8.  Clonidine 0.3 mg p.o. b.i.d.  9.  Spiriva 1 capsule inhaled daily.  10.  Protonix 40 mg p.o. daily. 11.  Metformin 500 mg p.o. at bedtime.  12.  Mag-Ox 400 mg p.o. daily.  13.  Synthroid 137 mcg p.o. daily.  14.  Lasix 20 mg p.o. daily.  15.  Lodine 400 mg p.o. b.i.d.  16.  Aspirin 81 mg p.o. daily.  17.  Arimidex 1 mg p.o. daily.  18.  Vicodin 5/500, 1 p.o. every 4 hours p.r.n. pain.   ALLERGIES: AVELOX.   SOCIAL HISTORY: The patient is  married. No history of alcohol or tobacco abuse.   FAMILY HISTORY: Positive for breast cancer, hypertension, diabetes. Negative for colon cancer. Negative for stroke.   REVIEW OF SYSTEMS:   CONSTITUTIONAL: No fever or change in weight.  EYES: No blurred or double vision. No glaucoma.  ENT: No tinnitus or hearing loss. No nasal discharge or bleeding. No difficulty swallowing.  RESPIRATORY: No cough or wheezing. Denies hemoptysis. No painful respiration.  CARDIOVASCULAR: No chest pain or orthopnea. No palpitations or syncope.  GASTROINTESTINAL: No nausea, vomiting, or diarrhea. No abdominal pain. No change in bowel habits.  GENITOURINARY: No dysuria or hematuria. No incontinence.  ENDOCRINE: No polyuria or polydipsia. No heat or cold intolerance.  HEMATOLOGIC: The patient denies anemia, easy bruising or bleeding.  LYMPHATIC: No swollen glands.  MUSCULOSKELETAL: The patient denies pain in her neck, back, shoulders, knees, hips. No gout.  NEUROLOGIC: No numbness or migraines. Denies seizures.  PSYCHIATRIC: The patient denies anxiety, insomnia or depression.   PHYSICAL EXAMINATION: GENERAL: The patient is in no acute distress.  VITAL SIGNS: Currently remarkable for a blood pressure of 186/73, with a heart rate of 61, a respiratory rate of 22. She is afebrile.  HEENT: Normocephalic, atraumatic. Pupils are equally round and reactive to light and accommodation. Extraocular  movements are intact. Sclerae are anicteric. Conjunctivae are clear. Oropharynx is clear.   NECK: Supple without JVD. No adenopathy or thyromegaly is noted.  LUNGS: Clear to auscultation and percussion without wheezes, rales, rhonchi. No dullness.  CARDIAC: Regular rate and rhythm with normal S1, S2. No significant rubs, murmurs or gallops. PMI is nondisplaced. Chest wall is nontender.  ABDOMEN: Soft, nontender with normoactive bowel sounds. No organomegaly or masses were appreciated. No hernias or bruits were noted.   EXTREMITIES: Without clubbing, cyanosis, edema. Pulses were 2+ bilaterally.  SKIN: Warm and dry without rash or lesions.  NEUROLOGIC: Cranial nerves II through XII are grossly intact. Deep tendon reflexes were symmetric. Motor exam revealed 3 out of 5 strength in the right lower extremity and 4 out of 5 strength in the right upper extremity.  PSYCHIATRIC: The patient was alert and oriented to person, place, and time. She was cooperative and used good judgment.   LABORATORY AND RADIOLOGICAL DATA:  EKG revealed sinus rhythm with no acute ischemic changes. Head CT revealed no acute abnormalities.   Her glucose was 134, with a BUN of 22, and a creatinine of 1.3, with a sodium of 137, potassium of 4.4. GFR was 39. Troponin less than 0.02. White count was 9.0 with a hemoglobin of 14.6.   ASSESSMENT: 1.  Right-sided weakness worrisome for stroke.  2.  Benign hypertension.  3.  Hyperlipidemia.  4.  Type 2 diabetes.  5.  Multiple sclerosis.  6.  Chronic obstructive pulmonary disease.  7.  Hypothyroidism.   PLAN: The patient will be admitted to telemetry with Lovenox and Plavix. Neuro checks and vital signs q. 4 hours. We will allow for permissive hypertension in case this is stroke. We will obtain carotid Dopplers and an MRI of the brain. We will consult Neurology, Physical Therapy, Speech Therapy as well as the case manager. We will follow her sugars with Accu-Cheks before meals and at bedtime and add sliding scale insulin as needed. We will check a TSH, lipid profile, and an A1c in the morning. Repeat chest x-ray per Radiology in the morning. Further treatment and evaluation will depend upon the patient's progress.   TOTAL TIME SPENT: 50 minutes.   ____________________________ Leonie Douglas Doy Hutching, MD jds:cb D: 06/23/2012 16:01:44 ET T: 06/23/2012 16:16:36 ET JOB#: [id number]  cc: Leonie Douglas. Doy Hutching, MD, <Dictator> Taneika Choi Lennice Sites MD ELECTRONICALLY SIGNED 06/24/2012 16:33

## 2014-05-29 NOTE — Discharge Summary (Signed)
PATIENT NAME:  Ana Nunez, Ana Nunez MR#:  [id number] DATE OF BIRTH:  [birth year]  DATE OF ADMISSION:  06/23/2012  DATE OF DISCHARGE:  06/26/2012  ADMITTING DIAGNOSIS: Stroke.  DISCHARGE DIAGNOSIS:  1. 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  MRA of brain is normal; however, the patient's neck MRA exhibited abnormalities which according neurology are artifacts.  2. Malignant hypertension.  3. Chronic obstructive pulmonary disease exacerbation, acute pneumonia. 4. Diabetes mellitus. Hemoglobin A1c 7.9.  5. Hyperlipidemia.  6. Obesity. 7. History of multiple sclerosis. 8. Chronic obstructive pulmonary disease, type 2 diabetes mellitus, hypertension, hyperlipidemia, overactive bladder, hypothyroidism as well as breast carcinoma.   DISCHARGE CONDITION: Stable.   DISCHARGE MEDICATIONS: The patient is to resume her outpatient medications which are:  1. Spiriva 18 mcg inhalation daily. 2. Exemestane 25 mg p.o. 1 tablet once in the morning.  3. Toviaz 4 mg tablets 2 tablets, which will be 8 mg p.o. daily in the morning.  4. Losartan 50 mg 2 daily. 5. Venlafaxine extended release 150 mg p.o. once daily in the morning.  6. Synthroid 150 mcg p.o. daily.  7. Calcium with vitamin D 600/200 one tablet twice a day.  8. Clonidine 0.3 mg twice daily.  9. Protonix 40 mg p.o. twice daily.  10. Multivitamins 1 daily.  11. Famotidine 10 mg p.o. at bedtime.  12. Cetirizine 10 mg p.o. at bedtime.  13. Docusate senna 50/8.6 one tablet once daily at bedtime.  14. MiraLax 1 packet mixed up in fluid 1 to 2 times a day as needed.  15. Albuterol CFC free 2 puffs 4 times daily as needed.  16. Furosemide 20 mg p.o. once daily as needed.  17. Tylenol PM 500/25 one tablet at bedtime.  18. Albuterol 2.5 mg in 3 mL inhalation solution, 1 vial via nebulizer every 4 to 8 hours as  needed.  19. Lortab 5/500 mg 1 tablet every 4 to 6 hours as needed.  20. AccuFlora tablet 1 tablet twice daily as needed.  21. Tessalon 200 mg 2 capsules 3 times daily.  22. Mucinex DM 60/1200 mg 1 tablet twice a day.  23. Tussionex Pennkinetic 8 mg/10 mg in 5 mL oral suspension 1 teaspoonful twice daily as needed.  24. Aricept 5 mg 2 tablets at bedtime.  25. Tramadol 50 mg 2 tablets in the morning as needed.  26. Cyclobenzaprine 10 mg p.o. in the morning and at bedtime as needed.  27. Tylenol 600 mg p.o. 2 tablets daily in the morning.  28. Tramadol 50 mg 1-1/2 tablets every 8 hours as needed.  29. Glipizide XL this is a planned dose 10 mg p.o. daily.  30. Atorvastatin 40 mg p.o. daily at bedtime. This is a new medication.  31. Clopidogrel 75 mg p.o. daily, new medication.  32. Budesonide formoterol 160/4.5 two puffs twice daily.  33. Erythromycin 250 mg p.o. once daily for 2 more days.  34. Magnesium oxide 400 mg p.o. once a day.   HOME OXYGEN: None.   DIET: 2 grams salt, low fat, low cholesterol, carbohydrate-controlled diet. Consistency: Mechanical soft with thin liquids general aspiration and reflux proportion. Medications were advanced and should be given in puree as patient wishes for easier swallowing. The patient is advised to sit upright with all oral intake, moisten foods. Activity limitations: As tolerated. Referrals to physical therapy 2 to 7 times a week.   FOLLOWUP APPOINTMENT:  With primary care physician in 2 days after discharge.   CONSULTANTS: Dr. Valora Corporal, social work.   RADIOLOGIC STUDIES: Chest x-ray PA and lateral, 06/23/2012 showed increased perihilar lung markings suggesting subsegmental atelectasis.  It may be atelectasis or developing cancer infiltrate in the left lower lobe as well. Chest PA and lateral 06/24/2012 showed mild interstitial opacities in the right mid and lower lungs, could represent atelectasis. Other etiology such as infection are not excluded  CT scan of head without contrast, 06/23/2012, showed no acute intracranial abnormalities, chronic changes were noted. MRI of brain without contrast, 06/24/2012, revealed left pontine and left parietal lobe as well as left frontoparietal lobe acute nonhemorrhagic infarct. MRA of brain without contrast showed no significant abnormality. MRA of neck without contrast, 06/26/2012, showed possible stenosis of the right carotid bifurcation and proximal right internal carotid artery  It is difficult to quantify in this limited exam, according to radiologist. Ultrasound of carotid arteries bilaterally on 06/23/2012, showed no evidence of hemodynamically significant stenosis in the extracranial carotid arteries.   HISTORY OF PRESENT ILLNESS: The patient is an 81 year old Caucasian female with history of MS who presents to the hospital with complaints of right-sided weakness. Please refer to Dr. Doy Hutching admission on 06/24/2012. On arrival to the hospital, the patient was noted to be not in acute distress. Her blood pressure was 186/73, heart rate was 61. Respiratory rate was 22 and she was afebrile. Physical exam revealed right-sided weakness 3 out of 5 strength in the right lower extremity, as well as 4 out of 5 strength in the right upper extremity.   A CT of head was unremarkable. An electroencephalogram done in the Emergency Room showed sinus rhythm with no acute ischemic changes. The patient's lab data done on arrival to the Emergency Room, 06/21/2012, revealed mild elevation of BUN and creatinine up to 23 and 1.42. The patient's sodium was 134. Glucose 228. Estimated GFR for non-African American would be 35. The patient's liver enzymes were normal. Cardiac enzymes were unremarkable. TSH was normal at 0.52. CBC was within normal limits. Urinalysis was unremarkable. The patient was admitted to the hospital for further evaluation because of concern of stroke. She underwent CT of her head initially and then later on MRI. MRI  revealed multiple infarcts in the pontine area as well as parietal and frontoparietal area, which were concerning for embolic stroke involving this vertebrobasilar basin as well as hemispheric areas which is carotid basin.  The patient was consulted by a neurologist who followed the patient along with the neurologist, Dr. Tamala Julian.  He saw the patient in consultation and felt that the patient has pontine lacunar infarct, very likely due to small vessel disease, uncontrolled risk factors; however, he felt that this could be also embolic as well. Left frontal as well as occipital; these infarcts were small and asymptomatic with no clear etiology. This appears to be more embolic than anything and likely cardioembolic according to Dr. Tamala Julian.  He recommended to get MRA of her head and neck to evaluate cerebral vasculature. Get echocardiogram as well as check Q94, folic acid levels as well as ESR as well as CRP . He recommended to continue Plavix and also change Zetia to a statin with goal of LDL below 70. He also recommended to have better blood pressure control with goal blood pressure below 130/80. He recommended also  to have better diabetes control with goal hemoglobin A1c below 7.  He recommended to start the patient on physical therapy as well  as occupational therapy, but he felt that the patient will likely need rehabilitation. The patient had echocardiogram performed on 06/25/2012. It was a difficult  study due to poor echo windows. No source of CVA, however, was noted. Left ventricular ejection fraction was noted to be between 55% to 60%. Normal global left ventricular systolic function.  Evidence of diastolic relaxation  abnormality and normal left ventricular size and systolic function and normal right ventricular systolic pressure. The patient had ultrasound of carotid arteries done, which was unremarkable. An MRA of her head which was unremarkable and neck which revealed right-sided possible stenosis; however,  Dr. (Dictation Anomaly)Mathew  Tamala Julian felt that the patient unlikely had an  RICA stenosis since carotid ultrasound showed normal velocities.  He recommended to get follow up carotid ultrasound in approximately 6 months. The patient was continued on Plavix. She also was started on Lipitor. The patient's lipid panel was checked and LDL was found to be 91. The patient's total cholesterol was 178 and the patient's HDL was 63.  It is recommended to follow the patient's lipid panel and make decisions about changing medications if needed. In regards to diabetes mellitus, the patient's hemoglobin A1c was checked and was found to be 7.9. The patient's glipizide was advanced to 10 mg once daily dose.  Regarding her other risk factors such as obesity, the patient was advised to lose weight and she was counseled about this. In regards to hypertension control, initially the patient's blood pressure was very difficult to control; however, we felt that we did not want to decrease her blood pressure precipitously and she is to have her medication advanced as outpatient. She is to follow up with her outpatient primary care physician in the next few days after discharge and make decisions about advancement of her medications if needed.  On the day of discharge, the patient's vitals; temperature is 97.8, pulse was 70, respiration was 18, blood pressure 151/65, saturation was 96% on room air at rest. The patient was also noted to have wheezing as well as shortness of breath. It was felt to be more chronic obstructive pulmonary disease exacerbation.  Her chest x-ray was concerning for possible pneumonia. She was initiated on antibiotic therapy. She is to continue antibiotics as well as inhalation therapy as outpatient. We were attempting to get her sputum cultures; however, we were not able to do to get them.  However, due to the patient's improvement, we felt that antibiotic was working.  For (Dictation Anomaly) chronic medical  problems, such as diabetes, hypothyroidism as well as breast cancer, the patient is to continue her outpatient management. The patient is being discharged in stable condition with the above-mentioned medications and follow up. The patient was evaluated by a physical therapist, who felt that the patient would benefit from rehabilitation.   TIME SPENT:  40 minutes.  The patient will be discharged to a rehabilitation facility today, on 06/26/2012.  ____________________________ Theodoro Grist, MD rv:rw D: 06/26/2012 17:52:37 ET T: 06/26/2012 18:28:39 ET JOB#: [id number]  cc: Theodoro Grist, MD, <Dictator> Outside Physician  Willard MD ELECTRONICALLY SIGNED 07/11/2012 20:12
